# Patient Record
Sex: Male | Born: 1990
Health system: Southern US, Community
[De-identification: ages and names within clinical notes are randomized; demographics above are authoritative.]

## PROBLEM LIST (undated history)

## (undated) DIAGNOSIS — F329 Major depressive disorder, single episode, unspecified: Secondary | ICD-10-CM

## (undated) DIAGNOSIS — F1910 Other psychoactive substance abuse, uncomplicated: Secondary | ICD-10-CM

## (undated) DIAGNOSIS — U071 COVID-19: Secondary | ICD-10-CM

## (undated) DIAGNOSIS — J45909 Unspecified asthma, uncomplicated: Secondary | ICD-10-CM

## (undated) DIAGNOSIS — F32A Depression, unspecified: Secondary | ICD-10-CM

## (undated) DIAGNOSIS — Z8489 Family history of other specified conditions: Secondary | ICD-10-CM

## (undated) DIAGNOSIS — T7840XA Allergy, unspecified, initial encounter: Secondary | ICD-10-CM

## (undated) DIAGNOSIS — K859 Acute pancreatitis without necrosis or infection, unspecified: Secondary | ICD-10-CM

## (undated) DIAGNOSIS — K219 Gastro-esophageal reflux disease without esophagitis: Secondary | ICD-10-CM

## (undated) DIAGNOSIS — F419 Anxiety disorder, unspecified: Secondary | ICD-10-CM

## (undated) DIAGNOSIS — R079 Chest pain, unspecified: Secondary | ICD-10-CM

## (undated) DIAGNOSIS — K602 Anal fissure, unspecified: Secondary | ICD-10-CM

## (undated) DIAGNOSIS — D721 Eosinophilia, unspecified: Secondary | ICD-10-CM

## (undated) HISTORY — DX: Allergy, unspecified, initial encounter: T78.40XA

## (undated) HISTORY — DX: Acute pancreatitis without necrosis or infection, unspecified: K85.90

## (undated) HISTORY — PX: APPENDECTOMY: SHX54

## (undated) HISTORY — DX: COVID-19: U07.1

## (undated) HISTORY — DX: Depression, unspecified: F32.A

## (undated) HISTORY — DX: Unspecified asthma, uncomplicated: J45.909

## (undated) HISTORY — DX: Gastro-esophageal reflux disease without esophagitis: K21.9

## (undated) HISTORY — DX: Anal fissure, unspecified: K60.2

## (undated) HISTORY — DX: Major depressive disorder, single episode, unspecified: F32.9

## (undated) HISTORY — PX: TONSILLECTOMY AND ADENOIDECTOMY: SUR1326

## (undated) HISTORY — DX: Anxiety disorder, unspecified: F41.9

## (undated) HISTORY — DX: Eosinophilia, unspecified: D72.10

## (undated) HISTORY — DX: Other psychoactive substance abuse, uncomplicated: F19.10

---
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
## 2010-01-08 ENCOUNTER — Emergency Department (HOSPITAL_COMMUNITY)
Admission: EM | Admit: 2010-01-08 | Discharge: 2010-01-08 | Payer: Self-pay | Source: Home / Self Care | Admitting: Emergency Medicine

## 2010-05-03 LAB — POCT I-STAT, CHEM 8
BUN: 9 mg/dL (ref 6–23)
Calcium, Ion: 1.13 mmol/L (ref 1.12–1.32)
Chloride: 104 mEq/L (ref 96–112)

## 2010-05-03 LAB — GLUCOSE, CAPILLARY

## 2011-01-15 ENCOUNTER — Emergency Department: Payer: Self-pay | Admitting: Emergency Medicine

## 2011-06-07 ENCOUNTER — Ambulatory Visit: Payer: Self-pay | Admitting: Sports Medicine

## 2011-11-01 ENCOUNTER — Emergency Department: Payer: Self-pay | Admitting: Emergency Medicine

## 2011-11-01 LAB — CBC WITH DIFFERENTIAL/PLATELET
Eosinophil %: 7.5 %
HGB: 14.5 g/dL (ref 13.0–18.0)
Lymphocyte %: 23.5 %
MCHC: 35 g/dL (ref 32.0–36.0)
Neutrophil %: 53.5 %
Platelet: 177 10*3/uL (ref 150–440)
RBC: 4.55 10*6/uL (ref 4.40–5.90)

## 2011-11-01 LAB — COMPREHENSIVE METABOLIC PANEL
Anion Gap: 9 (ref 7–16)
Calcium, Total: 9 mg/dL (ref 8.5–10.1)
Co2: 24 mmol/L (ref 21–32)
EGFR (African American): >60
EGFR (Non-African Amer.): >60
Osmolality: 277 (ref 275–301)
Potassium: 3.6 mmol/L (ref 3.5–5.1)
SGOT(AST): 19 U/L (ref 15–37)
Sodium: 140 mmol/L (ref 136–145)

## 2011-11-06 LAB — CULTURE, BLOOD (SINGLE)

## 2011-12-15 ENCOUNTER — Emergency Department: Payer: Self-pay | Admitting: Internal Medicine

## 2012-01-19 ENCOUNTER — Emergency Department: Payer: Self-pay | Admitting: Emergency Medicine

## 2012-01-19 LAB — CBC WITH DIFFERENTIAL/PLATELET
Basophil #: 0.1 10*3/uL (ref 0.0–0.1)
Eosinophil #: 0.4 10*3/uL (ref 0.0–0.7)
Eosinophil %: 5.2 %
MCH: 31.1 pg (ref 26.0–34.0)
Monocyte #: 0.5 x10 3/mm (ref 0.2–1.0)
Neutrophil %: 64.5 %
Platelet: 191 10*3/uL (ref 150–440)
RBC: 4.8 10*6/uL (ref 4.40–5.90)
RDW: 14.1 % (ref 11.5–14.5)

## 2012-01-19 LAB — COMPREHENSIVE METABOLIC PANEL
Albumin: 4.3 g/dL (ref 3.4–5.0)
Anion Gap: 5 — ABNORMAL LOW (ref 7–16)
Creatinine: 0.77 mg/dL (ref 0.60–1.30)
Glucose: 105 mg/dL — ABNORMAL HIGH (ref 65–99)
Osmolality: 278 (ref 275–301)
Potassium: 4.3 mmol/L (ref 3.5–5.1)
Sodium: 138 mmol/L (ref 136–145)
Total Protein: 7.3 g/dL (ref 6.4–8.2)

## 2012-01-19 LAB — PROTIME-INR: INR: 0.9

## 2012-01-19 LAB — URINALYSIS, COMPLETE
Bacteria: NONE SEEN
Bilirubin,UR: NEGATIVE
Glucose,UR: NEGATIVE mg/dL (ref 0–75)
Leukocyte Esterase: NEGATIVE
Nitrite: NEGATIVE
Specific Gravity: 1.009 (ref 1.003–1.030)
WBC UR: NONE SEEN /HPF (ref 0–5)

## 2012-04-30 ENCOUNTER — Emergency Department: Payer: Self-pay | Admitting: Emergency Medicine

## 2012-04-30 LAB — COMPREHENSIVE METABOLIC PANEL
BUN: 13 mg/dL (ref 7–18)
Chloride: 106 mmol/L (ref 98–107)
Co2: 22 mmol/L (ref 21–32)
Glucose: 94 mg/dL (ref 65–99)
Osmolality: 272 (ref 275–301)
Potassium: 3.8 mmol/L (ref 3.5–5.1)
SGPT (ALT): 32 U/L (ref 12–78)

## 2012-04-30 LAB — URINALYSIS, COMPLETE
Bacteria: NONE SEEN
Glucose,UR: NEGATIVE mg/dL (ref 0–75)
Ketone: NEGATIVE
Nitrite: NEGATIVE
Ph: 7 (ref 4.5–8.0)
Protein: NEGATIVE
RBC,UR: <1 /HPF (ref 0–5)
Specific Gravity: 1.01 (ref 1.003–1.030)
WBC UR: NONE SEEN /HPF (ref 0–5)

## 2012-04-30 LAB — CBC
MCH: 31.2 pg (ref 26.0–34.0)
MCHC: 34.2 g/dL (ref 32.0–36.0)
Platelet: 222 10*3/uL (ref 150–440)
RBC: 5.09 10*6/uL (ref 4.40–5.90)

## 2012-05-29 ENCOUNTER — Ambulatory Visit: Payer: Self-pay | Admitting: Gastroenterology

## 2012-06-03 DIAGNOSIS — F29 Unspecified psychosis not due to a substance or known physiological condition: Secondary | ICD-10-CM | POA: Insufficient documentation

## 2012-07-12 ENCOUNTER — Emergency Department: Payer: Self-pay | Admitting: Emergency Medicine

## 2012-07-12 LAB — ETHANOL
Ethanol %: <0.003 % (ref 0.000–0.080)
Ethanol: <3 mg/dL

## 2012-07-12 LAB — COMPREHENSIVE METABOLIC PANEL
Calcium, Total: 9.2 mg/dL (ref 8.5–10.1)
Co2: 24 mmol/L (ref 21–32)
EGFR (African American): >60
Glucose: 146 mg/dL — ABNORMAL HIGH (ref 65–99)
Potassium: 3.2 mmol/L — ABNORMAL LOW (ref 3.5–5.1)

## 2012-07-12 LAB — CBC
HCT: 44.9 % (ref 40.0–52.0)
HGB: 15.8 g/dL (ref 13.0–18.0)
MCHC: 35.2 g/dL (ref 32.0–36.0)
MCV: 90 fL (ref 80–100)
RBC: 5 10*6/uL (ref 4.40–5.90)
RDW: 13.4 % (ref 11.5–14.5)

## 2012-08-30 ENCOUNTER — Emergency Department: Payer: Self-pay | Admitting: Emergency Medicine

## 2013-01-10 LAB — COMPREHENSIVE METABOLIC PANEL
Alkaline Phosphatase: 100 U/L (ref 50–136)
Anion Gap: 8 (ref 7–16)
BUN: 7 mg/dL (ref 7–18)
Creatinine: 0.93 mg/dL (ref 0.60–1.30)
EGFR (African American): >60
EGFR (Non-African Amer.): >60
Osmolality: 274 (ref 275–301)
Potassium: 3.6 mmol/L (ref 3.5–5.1)
SGOT(AST): 21 U/L (ref 15–37)
SGPT (ALT): 26 U/L (ref 12–78)
Sodium: 138 mmol/L (ref 136–145)
Total Protein: 7.7 g/dL (ref 6.4–8.2)

## 2013-01-10 LAB — CBC
HCT: 43.4 % (ref 40.0–52.0)
HGB: 14.7 g/dL (ref 13.0–18.0)
MCH: 30.8 pg (ref 26.0–34.0)
MCHC: 33.8 g/dL (ref 32.0–36.0)
MCV: 91 fL (ref 80–100)
RBC: 4.76 10*6/uL (ref 4.40–5.90)

## 2013-01-10 LAB — ETHANOL
Ethanol %: <0.003 % (ref 0.000–0.080)
Ethanol: <3 mg/dL

## 2013-01-12 ENCOUNTER — Inpatient Hospital Stay: Payer: Self-pay | Admitting: Psychiatry

## 2013-01-13 LAB — BEHAVIORAL MEDICINE 1 PANEL
Albumin: 4.3 g/dL (ref 3.4–5.0)
Alkaline Phosphatase: 87 U/L
Basophil #: 0.1 10*3/uL (ref 0.0–0.1)
Chloride: 106 mmol/L (ref 98–107)
EGFR (African American): >60
Eosinophil #: 1.1 10*3/uL — ABNORMAL HIGH (ref 0.0–0.7)
Eosinophil %: 12.5 %
Lymphocyte %: 32 %
Monocyte #: 0.8 x10 3/mm (ref 0.2–1.0)
Monocyte %: 8.6 %
Neutrophil #: 4.2 10*3/uL (ref 1.4–6.5)
Osmolality: 273 (ref 275–301)
Potassium: 3.8 mmol/L (ref 3.5–5.1)
RBC: 5.07 10*6/uL (ref 4.40–5.90)
RDW: 13.3 % (ref 11.5–14.5)
Sodium: 137 mmol/L (ref 136–145)
Thyroid Stimulating Horm: 0.435 u[IU]/mL — ABNORMAL LOW

## 2013-01-13 LAB — DRUG SCREEN, URINE
Barbiturates, Ur Screen: NEGATIVE (ref ?–200)
Cannabinoid 50 Ng, Ur ~~LOC~~: POSITIVE (ref ?–50)
Cocaine Metabolite,Ur ~~LOC~~: NEGATIVE (ref ?–300)
MDMA (Ecstasy)Ur Screen: NEGATIVE (ref ?–500)
Methadone, Ur Screen: NEGATIVE (ref ?–300)
Phencyclidine (PCP) Ur S: NEGATIVE (ref ?–25)
Tricyclic, Ur Screen: POSITIVE (ref ?–1000)

## 2013-01-13 LAB — URINALYSIS, COMPLETE
Bacteria: NONE SEEN
Bilirubin,UR: NEGATIVE
Ketone: NEGATIVE
Ph: 5 (ref 4.5–8.0)
Protein: NEGATIVE
RBC,UR: 1 /HPF (ref 0–5)
Squamous Epithelial: NONE SEEN
WBC UR: 1 /HPF (ref 0–5)

## 2013-03-27 ENCOUNTER — Emergency Department: Payer: Self-pay | Admitting: Emergency Medicine

## 2013-03-27 LAB — COMPREHENSIVE METABOLIC PANEL
ALBUMIN: 4.3 g/dL (ref 3.4–5.0)
ALK PHOS: 86 U/L
ALT: 48 U/L (ref 12–78)
AST: 28 U/L (ref 15–37)
Anion Gap: 8 (ref 7–16)
BUN: 10 mg/dL (ref 7–18)
Bilirubin,Total: 0.2 mg/dL (ref 0.2–1.0)
CHLORIDE: 105 mmol/L (ref 98–107)
CREATININE: 0.8 mg/dL (ref 0.60–1.30)
Calcium, Total: 8.9 mg/dL (ref 8.5–10.1)
Co2: 26 mmol/L (ref 21–32)
EGFR (Non-African Amer.): >60
GLUCOSE: 88 mg/dL (ref 65–99)
Osmolality: 276 (ref 275–301)
POTASSIUM: 3.5 mmol/L (ref 3.5–5.1)
SODIUM: 139 mmol/L (ref 136–145)
Total Protein: 7.5 g/dL (ref 6.4–8.2)

## 2013-03-27 LAB — CBC
HCT: 46.7 % (ref 40.0–52.0)
HGB: 15.7 g/dL (ref 13.0–18.0)
MCH: 31.3 pg (ref 26.0–34.0)
MCHC: 33.7 g/dL (ref 32.0–36.0)
MCV: 93 fL (ref 80–100)
PLATELETS: 188 10*3/uL (ref 150–440)
RBC: 5.03 10*6/uL (ref 4.40–5.90)
RDW: 13.3 % (ref 11.5–14.5)
WBC: 11 10*3/uL — ABNORMAL HIGH (ref 3.8–10.6)

## 2013-03-27 LAB — ETHANOL
ETHANOL %: 0.068 % (ref 0.000–0.080)
ETHANOL LVL: 68 mg/dL

## 2013-03-27 LAB — SALICYLATE LEVEL: Salicylates, Serum: 3.2 mg/dL — ABNORMAL HIGH

## 2013-03-27 LAB — ACETAMINOPHEN LEVEL

## 2013-07-03 ENCOUNTER — Emergency Department: Payer: Self-pay | Admitting: Emergency Medicine

## 2013-08-13 ENCOUNTER — Emergency Department: Payer: Self-pay | Admitting: Emergency Medicine

## 2013-08-13 LAB — URINALYSIS, COMPLETE
BILIRUBIN, UR: NEGATIVE
Bacteria: NONE SEEN
Blood: NEGATIVE
Glucose,UR: NEGATIVE mg/dL (ref 0–75)
KETONE: NEGATIVE
Leukocyte Esterase: NEGATIVE
NITRITE: NEGATIVE
PH: 8 (ref 4.5–8.0)
Protein: NEGATIVE
RBC, UR: NONE SEEN /HPF (ref 0–5)
SQUAMOUS EPITHELIAL: NONE SEEN
Specific Gravity: 1.003 (ref 1.003–1.030)

## 2013-08-13 LAB — COMPREHENSIVE METABOLIC PANEL
ALBUMIN: 3.9 g/dL (ref 3.4–5.0)
ALK PHOS: 90 U/L
ALK PHOS: 97 U/L
ALT: 33 U/L (ref 12–78)
ANION GAP: 8 (ref 7–16)
AST: 25 U/L (ref 15–37)
AST: 28 U/L (ref 15–37)
Albumin: 4.2 g/dL (ref 3.4–5.0)
Anion Gap: 8 (ref 7–16)
BILIRUBIN TOTAL: 0.2 mg/dL (ref 0.2–1.0)
BILIRUBIN TOTAL: 0.4 mg/dL (ref 0.2–1.0)
BUN: 9 mg/dL (ref 7–18)
BUN: 9 mg/dL (ref 7–18)
CALCIUM: 9.2 mg/dL (ref 8.5–10.1)
CO2: 25 mmol/L (ref 21–32)
CO2: 28 mmol/L (ref 21–32)
CREATININE: 1.3 mg/dL (ref 0.60–1.30)
Calcium, Total: 9.5 mg/dL (ref 8.5–10.1)
Chloride: 105 mmol/L (ref 98–107)
Chloride: 105 mmol/L (ref 98–107)
Creatinine: 0.99 mg/dL (ref 0.60–1.30)
EGFR (Non-African Amer.): >60
GLUCOSE: 95 mg/dL (ref 65–99)
Glucose: 92 mg/dL (ref 65–99)
OSMOLALITY: 280 (ref 275–301)
Osmolality: 274 (ref 275–301)
POTASSIUM: 4 mmol/L (ref 3.5–5.1)
POTASSIUM: 4 mmol/L (ref 3.5–5.1)
SGPT (ALT): 33 U/L (ref 12–78)
SODIUM: 141 mmol/L (ref 136–145)
Sodium: 138 mmol/L (ref 136–145)
Total Protein: 7.4 g/dL (ref 6.4–8.2)
Total Protein: 7.9 g/dL (ref 6.4–8.2)

## 2013-08-13 LAB — CBC
HCT: 44.3 % (ref 40.0–52.0)
HCT: 46.3 % (ref 40.0–52.0)
HGB: 15 g/dL (ref 13.0–18.0)
HGB: 15.4 g/dL (ref 13.0–18.0)
MCH: 31.1 pg (ref 26.0–34.0)
MCH: 31.2 pg (ref 26.0–34.0)
MCHC: 33.9 g/dL (ref 32.0–36.0)
MCHC: 33.3 g/dL (ref 32.0–36.0)
MCV: 92 fL (ref 80–100)
MCV: 94 fL (ref 80–100)
PLATELETS: 180 10*3/uL (ref 150–440)
Platelet: 184 10*3/uL (ref 150–440)
RBC: 4.82 10*6/uL (ref 4.40–5.90)
RBC: 4.94 10*6/uL (ref 4.40–5.90)
RDW: 13.4 % (ref 11.5–14.5)
RDW: 13.6 % (ref 11.5–14.5)
WBC: 11.5 10*3/uL — ABNORMAL HIGH (ref 3.8–10.6)
WBC: 14.4 10*3/uL — AB (ref 3.8–10.6)

## 2013-08-13 LAB — ACETAMINOPHEN LEVEL: Acetaminophen: <2 ug/mL

## 2013-08-13 LAB — SALICYLATE LEVEL: Salicylates, Serum: 4.1 mg/dL — ABNORMAL HIGH

## 2013-08-13 LAB — ETHANOL: Ethanol %: <0.003 % (ref 0.000–0.080)

## 2013-08-14 ENCOUNTER — Inpatient Hospital Stay: Payer: Self-pay | Admitting: Psychiatry

## 2013-08-14 LAB — DRUG SCREEN, URINE
AMPHETAMINES, UR SCREEN: NEGATIVE (ref ?–1000)
BARBITURATES, UR SCREEN: NEGATIVE (ref ?–200)
BENZODIAZEPINE, UR SCRN: NEGATIVE (ref ?–200)
COCAINE METABOLITE, UR ~~LOC~~: NEGATIVE (ref ?–300)
Cannabinoid 50 Ng, Ur ~~LOC~~: POSITIVE (ref ?–50)
MDMA (Ecstasy)Ur Screen: NEGATIVE (ref ?–500)
Methadone, Ur Screen: NEGATIVE (ref ?–300)
OPIATE, UR SCREEN: NEGATIVE (ref ?–300)
PHENCYCLIDINE (PCP) UR S: NEGATIVE (ref ?–25)
Tricyclic, Ur Screen: NEGATIVE (ref ?–1000)

## 2013-08-14 LAB — URINALYSIS, COMPLETE
BACTERIA: NONE SEEN
BILIRUBIN, UR: NEGATIVE
Blood: NEGATIVE
Glucose,UR: NEGATIVE mg/dL (ref 0–75)
KETONE: NEGATIVE
LEUKOCYTE ESTERASE: NEGATIVE
NITRITE: NEGATIVE
PROTEIN: NEGATIVE
Ph: 7 (ref 4.5–8.0)
RBC,UR: <1 /HPF (ref 0–5)
Specific Gravity: 1.015 (ref 1.003–1.030)
Squamous Epithelial: NONE SEEN

## 2013-08-15 LAB — CBC WITH DIFFERENTIAL/PLATELET
BANDS NEUTROPHIL: 1 %
BASOS ABS: 1 %
COMMENT - H1-COM3: NORMAL
Comment - H1-Com2: NORMAL
EOS PCT: 6 %
HCT: 46.8 % (ref 40.0–52.0)
HGB: 16 g/dL (ref 13.0–18.0)
LYMPHS PCT: 18 %
MCH: 31.5 pg (ref 26.0–34.0)
MCHC: 34.1 g/dL (ref 32.0–36.0)
MCV: 92 fL (ref 80–100)
Monocytes: 6 %
PLATELETS: 200 10*3/uL (ref 150–440)
RBC: 5.07 10*6/uL (ref 4.40–5.90)
RDW: 13.5 % (ref 11.5–14.5)
Segmented Neutrophils: 55 %
VARIANT LYMPHOCYTE - H1-RLYMPH: 13 %
WBC: 10 10*3/uL (ref 3.8–10.6)

## 2013-08-15 LAB — URINALYSIS, COMPLETE
BILIRUBIN, UR: NEGATIVE
Bacteria: NONE SEEN
Blood: NEGATIVE
Glucose,UR: NEGATIVE mg/dL (ref 0–75)
Ketone: NEGATIVE
Leukocyte Esterase: NEGATIVE
NITRITE: NEGATIVE
PH: 6 (ref 4.5–8.0)
Protein: NEGATIVE
RBC,UR: 1 /HPF (ref 0–5)
SQUAMOUS EPITHELIAL: NONE SEEN
Specific Gravity: 1.012 (ref 1.003–1.030)
WBC UR: 1 /HPF (ref 0–5)

## 2013-08-15 LAB — SEDIMENTATION RATE: Erythrocyte Sed Rate: 6 mm/hr (ref 0–15)

## 2013-08-15 LAB — LACTATE DEHYDROGENASE: LDH: 147 U/L (ref 85–241)

## 2013-08-15 LAB — GC/CHLAMYDIA PROBE AMP

## 2013-08-16 LAB — CBC WITH DIFFERENTIAL/PLATELET
BASOS PCT: 0.9 %
Basophil #: 0.1 10*3/uL (ref 0.0–0.1)
Basophil: 1 %
COMMENT - H1-COM2: NORMAL
Eosinophil #: 0.5 10*3/uL (ref 0.0–0.7)
Eosinophil %: 5.5 %
Eosinophil: 7 %
HCT: 46.3 % (ref 40.0–52.0)
HGB: 16 g/dL (ref 13.0–18.0)
LYMPHS ABS: 2.2 10*3/uL (ref 1.0–3.6)
LYMPHS PCT: 23.9 %
Lymphocytes: 47 %
MCH: 31.7 pg (ref 26.0–34.0)
MCHC: 34.5 g/dL (ref 32.0–36.0)
MCV: 92 fL (ref 80–100)
MONOS PCT: 7 %
Monocyte #: 0.8 x10 3/mm (ref 0.2–1.0)
Monocyte %: 9.1 %
NEUTROS ABS: 5.7 10*3/uL (ref 1.4–6.5)
NEUTROS PCT: 60.6 %
Platelet: 205 10*3/uL (ref 150–440)
RBC: 5.03 10*6/uL (ref 4.40–5.90)
RDW: 13.1 % (ref 11.5–14.5)
Segmented Neutrophils: 38 %
WBC: 9.4 10*3/uL (ref 3.8–10.6)

## 2013-08-16 LAB — RAPID HIV-1/2 QL/CONFIRM: HIV-1/2, RAPID QL: NEGATIVE

## 2013-08-18 LAB — LITHIUM LEVEL: Lithium: 0.57 mmol/L — ABNORMAL LOW

## 2014-06-12 NOTE — Consult Note (Signed)
PATIENT NAME:  [name], [name] MR#:  [id number] DATE OF BIRTH:  Jan 17, 1991  DATE OF CONSULTATION:  01/10/2013  CONSULTING PHYSICIAN:  Gonzella Lex, MD  IDENTIFYING INFORMATION AND REASON FOR CONSULT: This is a 24 year old man with history of schizophrenia brought to the Emergency Room. Consultation for psychiatric disposition.   HISTORY OF PRESENT ILLNESS: Information obtained from the chart and the patient. The patient was brought to [facility] today for new intake evaluation. Dr. Clovis Riley evaluated him and found him to be psychotic, agitated, and admitting to recent substance abuse. He was endorsing auditory hallucinations that told him to kill and eat people. The family was feeling increasingly frightened for him and his safety. The patient himself is not a very good historian. He does admit to me that he has auditory hallucinations and that yes, they do tell him to kill and eat people. He denies that he actually thinks he does plan to kill or eat people. He will not answer my direct questions about drugs. When he does, he denies that he has used any kind of recreational drugs recently. He says that he has been compliant with his antipsychotics. He expresses anger towards his family and says he does not see any reason to be here in the hospital. He endorses angry mood, agitation, feeling ill at ease. He does not endorse any physical symptoms. Denies any pain, sickness, weight loss GI or pulmonary symptoms.   PAST PSYCHIATRIC HISTORY: The patient carries a diagnosis of schizophrenia or schizoaffective disorder. He has been treated with medications. He does not know many details about the past ones. Most recently it appears to have been mostly Seroquel just 100 mg at night. The patient has a history of substance abuse. He will not answer any questions to me right now about suicide history.   FAMILY HISTORY: Not known because he is adopted.   PAST MEDICAL HISTORY: Not known to have any significant  ongoing medical problems.   SUBSTANCE ABUSE HISTORY: Intermittently abuses a variety of substances including alcohol, dextromethorphan, narcotics, possibly cocaine and marijuana. We have documentation that he received some hydromorphone or hydrocodone in the last day or so. Mother thinks that he has taken all of it. As of right now, they have not yet been able to get a urine sample from him.   SOCIAL HISTORY: The patient is living by himself apparently on some property owned or rented by his family. He does not describe any particular activity that he engages in during the day. Closest relationships appear to be with his family, but they are mixed in that he is hostile and aggressive to them at times.   REVIEW OF SYSTEMS: Endorses anger. Endorses some sleep problems. Endorses hallucinations. Denies suicidal or homicidal ideation, although after he gets himself really angry,  he starts making threats to eat people in the Emergency Room. He denies any symptoms of pain, nausea, vomiting, cardiac symptoms, pulmonary symptoms, et Ronney Asters.   CURRENT MEDICATIONS: As far as I can tell, just Seroquel 100 mg at night.   ALLERGIES: No known drug allergies.   MENTAL STATUS EXAMINATION: Reasonably well-groomed young man, looks his stated age. Barely cooperative. Eye contact intermittent. Psychomotor activity at first is restrained, but once I told him he was not leaving the hospital tonight, he became so angry he attempted to run out of the Emergency Room and had to be put in 4-point restraints to be given medication and continue to thrash and fight. Speech is loud and shouted.  Thoughts are disorganized and hostile and angry. Admits to auditory hallucinations. Makes some hospital threatening statements. Denies suicidal ideation. Loud speech, rapid speech. Probably baseline normal intelligence and alert and oriented.   LABORATORY RESULTS: Chemistry panel is all normal. Alcohol level negative. CBC shows an elevated  white count at 14.8. Salicylates and acetaminophen are not toxic. He has not given [place] a urine sample yet.   ASSESSMENT: A 24 year old man with a history of schizophrenia who became extremely agitated, violent, and disruptive in the Emergency Room, requiring physical restraint and multiple doses of forced medicines. He needs hospital level treatment but at this point, his behavior would make him unsafe to manage on the ward. I recommend that he stay in the Emergency Room. We have given him liberal doses of antipsychotics and p.r.n. medicines. He will be re-evaluated over the weekend.   DIAGNOSIS, PRINCIPAL AND PRIMARY:  AXIS I: Schizophrenia.   SECONDARY DIAGNOSES: AXIS I: Polysubstance dependence.  AXIS II: Deferred.  AXIS III: No diagnosis.  AXIS IV: Severe from acute agitation and illness.  AXIS V: Functioning at time of evaluation 25.   ____________________________ Gonzella Lex, MD jtc:jcm D: 01/10/2013 18:23:42 ET T: 01/10/2013 19:02:34 ET JOB#: [id number]  cc: Gonzella Lex, MD, <Dictator> Gonzella Lex MD ELECTRONICALLY SIGNED 01/11/2013 10:58

## 2014-06-12 NOTE — Consult Note (Signed)
Brief Consult Note: Diagnosis: schizophrenia, polysubstance abuse.   Patient was seen by consultant.   Consult note dictated.   Recommend further assessment or treatment.   Orders entered.   Discussed with Attending MD.   Comments: Psychiatry: Patient seen and chart reviewewd. Patient is agitated and too agressive to allow for safe management on the floor. Orders written to increase doses of antipsychotics and liberal prns. Will reevaluate tomorrow.  Electronic Signatures: Gonzella Lex (MD)  (Signed 21-Nov-14 17:41)  Authored: Brief Consult Note   Last Updated: 21-Nov-14 17:41 by Gonzella Lex (MD)

## 2014-06-12 NOTE — H&P (Signed)
PATIENT NAME:  [name], [name] MR#:  [id number] DATE OF BIRTH:  Dec 29, 1990  DATE OF ADMISSION:  01/12/2013  DATE OF CONSULTATION: 01/12/2013.   IDENTIFYING INFORMATION AND CHIEF COMPLAINT: This is a 24 year old man, brought into the hospital under involuntary commitment from [facility].   CHIEF COMPLAINT: "I don't want to be here."   HISTORY OF PRESENT ILLNESS: Information obtained from the patient, from the chart and from the patient's mother. This patient has had a long-standing history of behavior problems, mood lability and agitation. He had been previously seeing a Designer, jewellery in [place]. He had recently been referred to see someone here in the community. He saw Dr. Clovis Riley this week, who found the patient to be dangerously psychotic and agitated. When he came into the hospital, the patient was stating that he had auditory hallucinations telling him to kill and eat people. His mood was angry. His behavior was agitated. It was unclear whether he had been getting or taking recent medicines, but all he had been prescribed was 100 mg of Seroquel and 100 mg of Lamictal. Since the first day in the Emergency Room, he has been taking medicine here in the ER, and has not had any return of a need for restraint.   PAST PSYCHIATRIC HISTORY: Long-standing history of behavior problems going back to childhood. Has spent time in [employer]. Has been diagnosed variously as attention deficit/hyperactivity disorder, bipolar and schizoaffective. Has a history of lot of aggressive acting-out behavior. It is not clear if he has ever made a serious attempt to kill himself, but has made some gestures. No hospitalizations previously, at least at our facility.   PAST MEDICAL HISTORY: The patient has asthma. No other ongoing medical problems.   SOCIAL HISTORY: Not married, not working. Has not yet gotten disability. Parents try and look out for him. He was recently living at a home owned by his parents.    SUBSTANCE ABUSE HISTORY: History of abuse of multiple substances including alcohol, dextromethorphan, narcotics and probably cocaine and marijuana. Probably been using narcotics for the last couple of days before coming into the hospital, possibly also Xanax.   REVIEW OF SYSTEMS: Currently he denies suicidal or homicidal ideation. Voices are diminished. Mood is still feeling anxious and agitated. Denies any suicidal or homicidal intent.   CURRENT MEDICATIONS: Since being here in the Emergency Room we have been giving him Seroquel 300 mg at night, Advair Diskus 1 puff twice a day, Ativan p.r.n., Seroquel 50 mg q.6 p.r.n., nicotine patch, albuterol p.r.n.   ALLERGIES: NO KNOWN DRUG ALLERGIES.   MENTAL STATUS EXAMINATION: Neatly groomed young man, looks his stated age. Generally, cooperative with the interview. Eye contact good. Psychomotor activity a little agitated. Speech is normal in rate, tone and volume. Affect a little bit animated and jittery. Mood stated as being still anxious and a little angry. Thoughts are a little bit disorganized but not grossly bizarre. Denies acute suicidal or homicidal ideation. Judgment and insight chronically impaired. Intelligence normal. Alert and oriented x4.   PHYSICAL EXAMINATION: SKIN: No acute skin lesions.  HEENT: Pupils equal and reactive. Face symmetric.  NECK AND BACK: Nontender.  MUSCULOSKELETAL: Full range of motion at all extremities.  NEUROLOGIC:  Cranial nerves symmetric and normal. Reflexes and strength symmetric and normal throughout.  LUNGS: Clear, no wheezes.  HEART: Regular rate and rhythm.  ABDOMEN: Soft, nontender, normal bowel sounds.  VITAL SIGNS: Temperature 98.2, pulse 89, respirations 18, blood pressure 131/82.   LABORATORY RESULTS: When he  came in, he had a white count elevated at 14.8, otherwise normal CBC. Chemistry panel normal. Alcohol negative. Acetaminophen and salicylates showed acetaminophen not detected, salicylates  slightly elevated. Still never gave urinalysis or drug screen, despite it being ordered several days ago.   ASSESSMENT: A 24 year old man with chronic mental health problems, behavior problems, presented with psychotic symptoms, mood lability and agitation. Has calmed down a bit since being in the Emergency Room. Family is still not comfortable with him coming home.   PLAN: To admit him to the inpatient unit for further management. Continue current medications. Try and get social work to see if he can be referred to appropriate outpatient care.   DIAGNOSIS, PRINCIPAL AND PRIMARY:  AXIS I: Schizoaffective disorder, bipolar type.   SECONDARY DIAGNOSES: AXIS I: Polysubstance dependence, in early remission.   AXIS II: Deferred.   AXIS III: Asthma.   AXIS IV: Severe from his chronic disability and lack of functioning.   AXIS V: Functioning at time of evaluation 30.     ____________________________ Gonzella Lex, MD jtc:cg D: 01/12/2013 17:32:33 ET T: 01/12/2013 22:31:56 ET JOB#: [id number]  cc: Gonzella Lex, MD, <Dictator> Gonzella Lex MD ELECTRONICALLY SIGNED 01/13/2013 9:21

## 2014-06-12 NOTE — Discharge Summary (Signed)
PATIENT NAME:  Michael [name], Michael [name] MR#:  [id number] DATE OF BIRTH:  01/19/1991  DATE OF ADMISSION:  01/12/2013 DATE OF DISCHARGE:  01/20/2013  HOSPITAL COURSE: See dictated history and physical for details of admission. A 24 year old man with a history of schizoaffective disorder and substance abuse and behavioral disruption, admitted to the hospital in an agitated mood, labile state. Initially required restraint because of aggressive behavior. Required some forced medication briefly. Gradually did become more cooperative with treatment. He did not attempt to harm himself and did not hurt anyone else while he was in the hospital. He was treated with a combination of medication and psychotherapy. The patient was treated with lithium and quetiapine primarily for psychiatric problems, which he tolerated well. At the time of discharge, the patient's mental state was greatly improved. He was not disorganized or paranoid. Denied suicidal or homicidal ideation. Reported he was no longer having intrusive bizarre ideas about hurting people or killing people. Denied hallucinations. We worked with his family upon educating them about the treatment plan and worked with the patient on hopefully coming up with a plan for community support treatment in the community. He was educated about the importance of staying away from abusable drugs with his mental state and expressed an understanding of that. At the time of discharge, he was not acutely dangerous, appeared to be tolerating treatment much better.   DISCHARGE MEDICATIONS: Lithium 900 mg [name].o. at bedtime, quetiapine 400 mg [name].o. at bedtime plus 100 mg twice a day, albuterol inhaler 2 puffs every 4 hours as needed for shortness of breath, Advair Diskus 250/50 mcg 1 puff twice a day.   LABORATORY RESULTS: Admission labs including urinalysis unremarkable. A set of rib films that showed no fracture or abnormality. Drug screen positive for opiates, cannabis, and benzodiazepines.  TSH slightly low at 0.443. AST elevated at 41. CBC unremarkable. The lithium level prior to discharge was 0.8.   MENTAL STATUS EXAM AT DISCHARGE: Neatly dressed and groomed young man who looks his stated age. Good eye contact. Normal psychomotor activity. Speech normal rate, tone and volume. Affect reactive, euthymic, appropriate. Mood stated as okay. Thoughts lucid. No loosening of associations or delusions. Denied auditory or visual hallucinations. Denied suicidal or homicidal ideation. Showed good insight and judgment. Normal intelligence. Alert and oriented x 4.   DISPOSITION: Discharge home. Follow up with [facility].   DIAGNOSIS, PRINCIPAL AND PRIMARY:  AXIS I: Schizoaffective disorder, bipolar type.   SECONDARY DIAGNOSES: AXIS I: Polysubstance dependence.  AXIS II: Antisocial and borderline features.  AXIS III: Chronic obstructive pulmonary disease, asthma.  AXIS IV: Severe, chronic illness.  AXIS V: Functioning at time of discharge 55.    ____________________________ Gonzella Lex, MD jtc:jcm D: 01/31/2013 15:57:43 ET T: 01/31/2013 16:21:24 ET JOB#: [id number]  cc: Gonzella Lex, MD, <Dictator> Gonzella Lex MD ELECTRONICALLY SIGNED 02/03/2013 11:23

## 2014-06-13 NOTE — Consult Note (Signed)
Brief Consult Note: Diagnosis: bilateral inguinal lymphnadenopathy.   Patient was seen by consultant.   Recommend further assessment or treatment.   Orders entered.   Comments: Hx of pain for ~ 1 week; no sores, ulcers, infections in legs, perineum or perianal region. Bilateral process by Hx, exam, and CT. Very tender inguinal nodes, but pt may have a very low pain threshold (opioid abuse Hx). No other lymphadenopathy, no redness. He does have a small area of dry slightly erythematous rash (fungal?) slightly to the left of his midline beltline crease. I have added Keflex (and D/C-e dthe doxycycline), as I believe it may be better tolerated. If D/C-ed, he should receive 14 days of Keflex and can F/U in my office in 2 - 3 weeks. I will have Dr Burt Knack re-examine him next week..  Electronic Signatures: Consuela Mimes (MD)  (Signed 27-Jun-15 16:02)  Authored: Brief Consult Note   Last Updated: 27-Jun-15 16:02 by Consuela Mimes (MD)

## 2014-06-13 NOTE — H&P (Signed)
PATIENT NAME:  [name], [name] MR#:  [id number] DATE OF BIRTH:  [birth year]  DATE OF ADMISSION:  08/14/2013  REFERRING PHYSICIAN: Emergency Room M.D.   ATTENDING PHYSICIAN: Jolanta B. Bary Leriche, M.D.   IDENTIFYING DATA: [name] is a 24 year old male with history of bipolar illness.   CHIEF COMPLAINT: "I was arguing with my mother."   HISTORY OF PRESENT ILLNESS:  The patient appears clueless to the reasons he was petitioned by his mother. He reports that they had been arguing yesterday, and that he was asked to be honest with her, so he disclosed to his mother the fact that he has continues auditory hallucinations of two voices with a [place] accent with a [place] accent. The voices telling him to kill himself and kill other people. Apparently, according to the chart, the patient texted  his mother a picture of himself with mouth full of medication and threatened suicide. The patient does have a history of suicide in 2013. He attempted by hanging and 2014, he overdosed on medications. The patient reports no problems whatsoever. He feels that he is doing splendidly and that his mother is exaggerating. He sees Dr. Timmothy Euler at [facility], who prescribes Geodon 20 mg once a day. The patient reports that when discharged from [facility] last time, he was taking lithium and Seroquel, but did not like sedation from Seroquel and did not like multiple doses of medication; however, his lithium was given a single dose last time. He denies symptoms of depression, anxiety, or psychosis, except for auditory hallucinations. He denies symptoms suggestive of bipolar mania. Reports normal level of activity. Good sleep. He denies alcohol or illicit drug abuse, but has been positive for cannabinoids.   PAST PSYCHIATRIC HISTORY: He has a long history of behavioral problems while growing up.  He has had multiple psychiatric diagnoses including oppositional defiant disorder, bipolar schizoaffective. He calls  himself a schizophrenic. There was a history of drug abuse, but lately he is only using marijuana consistently.  There were medication trials with Depakote, Seroquel, lithium, and Latuda. He seems to like Geodon the best, but I doubt that he is compliant at all. There were 2 suicide attempts as above.   FAMILY PSYCHIATRIC HISTORY: Mother with mental illness. Reportedly, she is doing fine.    PAST MEDICAL HISTORY: Obesity.  ALLERGIES: LATUDA.  MEDICATIONS ON ADMISSION: Geodon 20 mg daily.   SOCIAL HISTORY: He currently lives with his mother. His parents are divorced, but both are very supportive. He has Medicaid, but not has disability. He has never been married and has no children .  REVIEW OF SYSTEMS:  CONSTITUTIONAL: No fevers or chills. No weight changes.  EYES: No double or blurred vision.  EAR, NOSE, AND THROAT: No hearing loss.  RESPIRATORY: No shortness of breath or cough.  CARDIOVASCULAR: No chest pain or orthopnea.  GASTROINTESTINAL: Positive for is groin pain on the right side and no abdominal pain, nausea, vomiting, or diarrhea.  GENITOURINARY: No incontinence or frequency.  ENDOCRINE: No heat or cold intolerance.  LYMPHATIC: No anemia or easy bruising.  INTEGUMENTARY: No acne or rash.  MUSCULOSKELETAL: No muscle or joint pain.  NEUROLOGIC: No tingling or weakness.  PSYCHIATRIC: See history of present illness for details.   PHYSICAL EXAMINATION: VITAL SIGNS: Blood pressure 113/74, pulse 65, respirations 20, temperature 98.9. GENERAL:  This is a well-developed young male in no acute distress.  HEENT: The pupils are equal, round, and reactive to light. Sclerae are anicteric.  NECK: Supple. No thyromegaly.  LUNGS: Clear to auscultation. No dullness to percussion.  HEART: Regular rhythm and rate. No murmurs, rubs, or gallops.  ABDOMEN: Soft, nontender, nondistended. Positive bowel sounds.  MUSCULOSKELETAL: Normal muscle strength in all extremities.  SKIN: No rashes or  bruises.  LYMPHATIC: No cervical adenopathy.  NEUROLOGIC: Cranial nerves II-XII are intact.   LABORATORY DATA: Chemistries are within normal limits. Blood alcohol level is 0. LFTs within normal limits. Urine toxicology screen positive for cannabinoids. CBC within normal limits except for white blood count of 14.4. Urinalysis is not suggestive of urinary tract infection. Serum acetaminophen less than 2, salicylates 4.1.   EKG: normal sinus rhythm and normal EKG.  MENTAL STATUS EXAMINATION: On admission, the patient is alert and oriented to person, place, and time.  He gives me a completely different story from one obtained from the mother and it is recorded in the chart.  He is pleasant, polite, and cooperative, but giggling, hyper, goofy, certainly in mood too good for the situation with slightly pressured speech, loud at times. He is well groomed and casually dressed.  Thought process is illogical. He denies thoughts of hurting himself or others, but does report auditory command hallucinations telling him to do just that.  In the past, he did overdose and hurt people in response to the voices. Reportedly, he could have swallowed some medications last night and sent a photograph of it to his mother. He is grandiose and delusional. He is hallucinating. His cognition is grossly intact. Registration and recall are normal. Poor long and short-term memory. He is of average intelligence and fund of knowledge. His insight and judgment are poor.  SUICIDE RISK ASSESSMENT ON ADMISSION: This is a patient with a history of mood instability, depression, suicide attempt, substance abuse, and treatment noncompliance, who came to the hospital manic and psychotic in the context of treatment noncompliance.   INITIAL DIAGNOSES:  AXIS I: Bipolar disorder, mixed episode, with psychosis and cannabis abuse.  AXIS II: Deferred.  AXIS III: Deferred.  AXIS IV: Mental illness, substance abuse, family conflict, treatment  compliance.  AXIS V: Global assessment of functioning 25.   PLAN: The patient was in admitted to [facility] for safety, stabilization, and medication management. He was initially placed on suicide precautions and was closely monitored for any unsafe behaviors. He underwent full psychiatric and risk assessment. He received pharmacotherapy, individual and group psychotherapy, substance abuse counseling, and support from therapeutic milieu.   1.  Suicidal ideation: The patient denies.  2.  Mood and psychosis: He only wants to take Geodon 20 mg daily.  We, however, will increase gradually Geodon to 80 mg twice daily. We will restart lithium and offer, possibly, a sleeping pill if needed,   3. The patient complains of pain in the right groin. We will ask medicine for consultation.  4.  Substance abuse. He minimizes problems and declines treatment.   DISPOSITION: Most likely with his mother. The patient will follow up with [facility] and Dr. Timmothy Euler. He would benefit from long-lasting injectable antipsychotics if he agrees.   ____________________________ Wardell Honour Bary Leriche, MD jbp:ts D: 08/15/2013 14:45:31 ET T: 08/15/2013 15:42:23 ET JOB#: [id number]  cc: Jolanta B. Bary Leriche, MD, <Dictator> Clovis Fredrickson MD ELECTRONICALLY SIGNED 09/03/2013 4:48
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
## 2014-10-09 ENCOUNTER — Encounter: Payer: Self-pay | Admitting: Emergency Medicine

## 2014-10-09 ENCOUNTER — Emergency Department
Admission: EM | Admit: 2014-10-09 | Discharge: 2014-10-09 | Disposition: A | Discharge status: Home / Self Care | Payer: Medicaid Other | Attending: Emergency Medicine | Admitting: Emergency Medicine

## 2014-10-09 DIAGNOSIS — F1210 Cannabis abuse, uncomplicated: Secondary | ICD-10-CM | POA: Diagnosis present

## 2014-10-09 DIAGNOSIS — F1010 Alcohol abuse, uncomplicated: Secondary | ICD-10-CM | POA: Diagnosis not present

## 2014-10-09 DIAGNOSIS — Z79899 Other long term (current) drug therapy: Secondary | ICD-10-CM | POA: Insufficient documentation

## 2014-10-09 DIAGNOSIS — F1310 Sedative, hypnotic or anxiolytic abuse, uncomplicated: Secondary | ICD-10-CM | POA: Insufficient documentation

## 2014-10-09 DIAGNOSIS — F319 Bipolar disorder, unspecified: Secondary | ICD-10-CM

## 2014-10-09 DIAGNOSIS — F1994 Other psychoactive substance use, unspecified with psychoactive substance-induced mood disorder: Secondary | ICD-10-CM | POA: Diagnosis not present

## 2014-10-09 LAB — CBC
HCT: 46.9 % (ref 40.0–52.0)
Hemoglobin: 15.9 g/dL (ref 13.0–18.0)
MCH: 30.5 pg (ref 26.0–34.0)
MCHC: 33.9 g/dL (ref 32.0–36.0)
MCV: 89.8 fL (ref 80.0–100.0)
PLATELETS: 250 10*3/uL (ref 150–440)
RBC: 5.22 MIL/uL (ref 4.40–5.90)
RDW: 13.8 % (ref 11.5–14.5)
WBC: 15.6 10*3/uL — ABNORMAL HIGH (ref 3.8–10.6)

## 2014-10-09 LAB — COMPREHENSIVE METABOLIC PANEL
ALT: 27 U/L (ref 17–63)
ANION GAP: 12 (ref 5–15)
AST: 22 U/L (ref 15–41)
Albumin: 4.8 g/dL (ref 3.5–5.0)
Alkaline Phosphatase: 81 U/L (ref 38–126)
BUN: <5 mg/dL — ABNORMAL LOW (ref 6–20)
CHLORIDE: 107 mmol/L (ref 101–111)
CO2: 24 mmol/L (ref 22–32)
Calcium: 9.2 mg/dL (ref 8.9–10.3)
Creatinine, Ser: 0.83 mg/dL (ref 0.61–1.24)
Glucose, Bld: 103 mg/dL — ABNORMAL HIGH (ref 65–99)
POTASSIUM: 3.6 mmol/L (ref 3.5–5.1)
Sodium: 143 mmol/L (ref 135–145)
Total Bilirubin: 0.4 mg/dL (ref 0.3–1.2)
Total Protein: 8.1 g/dL (ref 6.5–8.1)

## 2014-10-09 LAB — URINE DRUG SCREEN, QUALITATIVE (ARMC ONLY)
Amphetamines, Ur Screen: POSITIVE — AB
BARBITURATES, UR SCREEN: NOT DETECTED
Benzodiazepine, Ur Scrn: POSITIVE — AB
CANNABINOID 50 NG, UR ~~LOC~~: POSITIVE — AB
COCAINE METABOLITE, UR ~~LOC~~: NOT DETECTED
MDMA (ECSTASY) UR SCREEN: NOT DETECTED
Methadone Scn, Ur: NOT DETECTED
Opiate, Ur Screen: NOT DETECTED
PHENCYCLIDINE (PCP) UR S: NOT DETECTED
Tricyclic, Ur Screen: NOT DETECTED

## 2014-10-09 LAB — ETHANOL: Alcohol, Ethyl (B): 193 mg/dL — ABNORMAL HIGH (ref <5)

## 2014-10-09 LAB — LIPASE, BLOOD: LIPASE: 12 U/L — AB (ref 22–51)

## 2014-10-09 MED ORDER — LORAZEPAM 2 MG PO TABS
0.0000 mg | ORAL_TABLET | Freq: Four times a day (QID) | ORAL | Status: DC
  Administered 2014-10-09: 2 mg via ORAL
  Filled 2014-10-09: qty 1

## 2014-10-09 MED ORDER — LORAZEPAM 2 MG/ML IJ SOLN: 0.0000 mg | Freq: Four times a day (QID) | INTRAMUSCULAR | Status: DC

## 2014-10-09 MED ORDER — LORAZEPAM 2 MG PO TABS
0.0000 mg | ORAL_TABLET | Freq: Two times a day (BID) | ORAL | Status: DC
  Administered 2014-10-09: 2 mg via ORAL

## 2014-10-09 MED ORDER — THIAMINE HCL 100 MG/ML IJ SOLN: 100.0000 mg | Freq: Every day | INTRAMUSCULAR | Status: DC

## 2014-10-09 MED ORDER — LORAZEPAM 2 MG/ML IJ SOLN: 0.0000 mg | Freq: Two times a day (BID) | INTRAMUSCULAR | Status: DC

## 2014-10-09 MED ORDER — VITAMIN B-1 100 MG PO TABS
100.0000 mg | ORAL_TABLET | Freq: Every day | ORAL | Status: DC
  Administered 2014-10-09: 100 mg via ORAL
  Filled 2014-10-09: qty 1

## 2014-10-09 MED ORDER — LORAZEPAM 2 MG PO TABS
ORAL_TABLET | ORAL | Status: AC
  Filled 2014-10-09: qty 1

## 2014-10-09 NOTE — BH Assessment (Signed)
Assessment Note  Michael [name] is an 24 y.o. male who reports to [facility] ER for substance abuse and SI.  Pt. Reports being suicidal on and off since he was 24 years of age and has current thoughts. H/o six attempts of suicide (hanging and self destructive behaviors with banging head on wall). His last attempt before 10/08/14 was two years ago.  His current trigger is an issue with finding his biological parents and current plan is to slowly poison himself with ETOH, herion, and cocaine.  Pt. Uses alcohol on a daily basis of approximately 1/2 gallon of bourbon a day, cocaine every other day, and herion "every once and a while." Previous inpatient at [facility], [place], and [place]. Pt. has nightmares of killing himself from jumping from a building. Pt. Goes to inpatient services with Clide Deutscher at [facility]. H/o diagnosis of depression and anxiety. Pt. Reports having severe anger issues. Pt. Has vague HI thoughts of "burn everyone to dust." He does not have a specific person named or plan. No A/V H.       Axis I: Anxiety Disorder NOS, Depressive Disorder NOS and Substance Abuse  Past Medical History: History reviewed. No pertinent past medical history.  History reviewed. No pertinent past surgical history.  Family History: History reviewed. No pertinent family history.  Social History:  reports that he has been smoking Cigarettes.  He has been smoking about 1.00 pack per day. He does not have any smokeless tobacco history on file. His alcohol and drug histories are not on file.  Additional Social History:  Alcohol / Drug Use Pain Medications: None Reported Prescriptions: None Reported Over the Counter: None Reported History of alcohol / drug use?: Yes Longest period of sobriety (when/how long): not since he was 14 Negative Consequences of Use: Financial, Personal relationships Withdrawal Symptoms: Agitation, Fever / Chills, Nausea / Vomiting, Tremors, Irritability,  Weakness Substance #1 Name of Substance 1: Alcohol  1 - Age of First Use: 12 1 - Amount (size/oz): 1/2 gallon of bourbon 1 - Frequency: everyday 1 - Duration: years 1 - Last Use / Amount: 10/08/14 Substance #2 Name of Substance 2: Cocaine 2 - Age of First Use: 17 2 - Amount (size/oz): gram 2 - Frequency: every other day 2 - Duration: 6 years 2 - Last Use / Amount: 10/08/14 Substance #3 Name of Substance 3: Herion 3 - Age of First Use: 19 3 - Amount (size/oz): 1/2 gram 3 - Frequency: "on & off" 3 - Duration: 4 years 3 - Last Use / Amount: 10/08/14  CIWA: CIWA-Ar BP: 125/79 mmHg Pulse Rate: 97 Nausea and Vomiting: mild nausea with no vomiting Tactile Disturbances: very mild itching, pins and needles, burning or numbness Tremor: two Auditory Disturbances: very mild harshness or ability to frighten Paroxysmal Sweats: barely perceptible sweating, palms moist Visual Disturbances: not present Anxiety: three Headache, Fullness in Head: moderately severe Agitation: somewhat more than normal activity Orientation and Clouding of Sensorium: oriented and can do serial additions CIWA-Ar Total: 14 COWS: Clinical Opiate Withdrawal Scale (COWS) Resting Pulse Rate: Pulse Rate 80 or below Sweating: Subjective report of chills or flushing Restlessness: Able to sit still Pupil Size: Pupils pinned or normal size for room light Bone or Joint Aches: Patient reports sever diffuse aching of joints/muscles Runny Nose or Tearing: Not present GI Upset: No GI symptoms Tremor: No tremor Yawning: No yawning Anxiety or Irritability: Patient reports increasing irritability or anxiousness Gooseflesh Skin: Skin is smooth COWS Total Score: 4  Allergies:  Allergies  Allergen Reactions  . Serotonin Reuptake Inhibitors (Ssris) Rash  . Gabapentin Nausea Only and Nausea And Vomiting  . Prednisone Other (See Comments)    Difficulty breathing  . Anette Guarneri [Lurasidone Hcl] Rash and Nausea And Vomiting     Home Medications:  (Not in a hospital admission)  OB/GYN Status:  No LMP for male patient.  General Assessment Data Location of Assessment: [facility] ED TTS Assessment: In system Is this a Tele or Face-to-Face Assessment?: Face-to-Face Is this an Initial Assessment or a Re-assessment for this encounter?: Initial Assessment Marital status: Single Maiden name: na Is patient pregnant?: No Pregnancy Status: No Living Arrangements: Other relatives (parents) Can pt return to current living arrangement?: Yes Is patient capable of signing voluntary admission?: Yes Referral Source: Self/Family/Friend Insurance type: None  Medical Screening Exam ([place]) Medical Exam completed: Yes  Crisis Care Plan Living Arrangements: Other relatives (parents) Name of Psychiatrist: [facility] Name of Therapist: Clide Deutscher  Education Status Is patient currently in school?: No Current Grade: na Highest grade of school patient has completed: [employer] Name of school: na Contact person: na  Risk to self with the past 6 months Suicidal Ideation: Yes-Currently Present Has patient been a risk to self within the past 6 months prior to admission? : Yes Suicidal Intent: Yes-Currently Present Has patient had any suicidal intent within the past 6 months prior to admission? : Yes Is patient at risk for suicide?: Yes Suicidal Plan?: Yes-Currently Present Has patient had any suicidal plan within the past 6 months prior to admission? : Yes Specify Current Suicidal Plan: Alcohol/ Substance Abuse Overdose Access to Means: Yes Specify Access to Suicidal Means: Yes; pt has access to alcohol/ drugs What has been your use of drugs/alcohol within the last 12 months?: Daily user of alcohol, cigerettes, cocaine Previous Attempts/Gestures: Yes How many times?: 6 Other Self Harm Risks: Cutting & punching walls Triggers for Past Attempts: Family contact ("nightmares") Intentional Self Injurious  Behavior: Cutting, Damaging Comment - Self Injurious Behavior: Pt. has Superficial lacerations to arm Family Suicide History: Unknown (pt. was adopted) Recent stressful life event(s): Conflict (Comment), Other (Comment) (finding out about birth parents) Persecutory voices/beliefs?: No Depression: Yes Depression Symptoms: Insomnia, Fatigue, Feeling angry/irritable Substance abuse history and/or treatment for substance abuse?: Yes Suicide prevention information given to non-admitted patients: Yes  Risk to Others within the past 6 months Homicidal Ideation: Yes-Currently Present Does patient have any lifetime risk of violence toward others beyond the six months prior to admission? : Yes (comment) ("burn everyone in this world to dust") Thoughts of Harm to Others: Yes-Currently Present Comment - Thoughts of Harm to Others: "burn everyone in this world to dust" Current Homicidal Intent: No Current Homicidal Plan: No Access to Homicidal Means: No Identified Victim: not speficified History of harm to others?: No Assessment of Violence: None Noted Violent Behavior Description: none noted Does patient have access to weapons?: Yes (Comment) (parents have them locked b/c of him) Criminal Charges Pending?: No Does patient have a court date: No Is patient on probation?: No  Psychosis Hallucinations: None noted Delusions: None noted  Mental Status Report Appearance/Hygiene: Bizarre, Disheveled, Poor hygiene, In scrubs Eye Contact: Good Motor Activity: Agitation, Restlessness, Shuffling Speech: Argumentative, Soft Level of Consciousness: Restless, Irritable Mood: Anxious, Depressed, Suspicious, Irritable Affect: Anxious, Depressed, Irritable Anxiety Level: Severe Thought Processes: Relevant, Coherent Judgement: Impaired Orientation: Person, Place, Time, Situation, Appropriate for developmental age Obsessive Compulsive Thoughts/Behaviors: None  Cognitive Functioning Concentration:  Normal  Memory: Recent Intact, Remote Impaired IQ: Average Insight: Poor Impulse Control: Poor Appetite: Poor Weight Loss: 25 Weight Gain: 0 Sleep: Decreased Total Hours of Sleep: 2 Vegetative Symptoms: None  ADLScreening [facility] Assessment Services) Patient's cognitive ability adequate to safely complete daily activities?: Yes Patient able to express need for assistance with ADLs?: Yes Independently performs ADLs?: Yes (appropriate for developmental age)  Prior Inpatient Therapy Prior Inpatient Therapy: Yes Prior Therapy Dates: 6 months Prior Therapy Facilty/Provider(s): [facility] Reason for Treatment: SI  Prior Outpatient Therapy Prior Outpatient Therapy: Yes Prior Therapy Dates:  (yes, has an appt today) Prior Therapy Facilty/Provider(s): Clide Deutscher Reason for Treatment: depression, substance abuse, anxiety Does patient have an ACCT team?: No Does patient have Intensive In-House Services?  : No Does patient have [facility] services? : No Does patient have P4CC services?: No  ADL Screening (condition at time of admission) Patient's cognitive ability adequate to safely complete daily activities?: Yes Patient able to express need for assistance with ADLs?: Yes Independently performs ADLs?: Yes (appropriate for developmental age)       Abuse/Neglect Assessment (Assessment to be complete while patient is alone) Physical Abuse: Denies Verbal Abuse: Denies Sexual Abuse: Denies Exploitation of patient/patient's resources: Denies Self-Neglect: Denies Values / Beliefs Cultural Requests During Hospitalization: None Spiritual Requests During Hospitalization: None Consults Spiritual Care Consult Needed: No Social Work Consult Needed: No Regulatory affairs officer (For Healthcare) Does patient have an advance directive?: No Would patient like information on creating an advanced directive?: No - patient declined information    Additional Information 1:1 In Past 12 Months?: No CIRT  Risk: No Elopement Risk: No Does patient have medical clearance?: Yes     Disposition:  Disposition Initial Assessment Completed for this Encounter: Yes Disposition of Patient: Other dispositions (Psych MD Consult) Other disposition(s): Other (Comment) (Psych MD Consult)  On Site Evaluation by:   Reviewed with Physician:    Michael [name] 10/09/2014 1:06 PM

## 2014-10-09 NOTE — ED Notes (Signed)

## 2014-10-09 NOTE — ED Provider Notes (Signed)
[facility] Emergency Department Provider Note  ____________________________________________  Time seen: 6:00 AM  I have reviewed the triage vital signs and the nursing notes.   HISTORY  Chief Complaint Drug Overdose and Suicide Attempt      HPI Michael [name] is a 24 y.o. male resents with polysubstance abuse. Patient states that he is been drinking alcohol and using heroin. Patient admits to suicidal ideation. Patient admits to self-inflicted abrasions to his bilateral arms.    Past medical history Polysubstance abuse There are no active problems to display for this patient.   No past surgical history on file.  Current Outpatient Rx  Name  Route  Sig  Dispense  Refill  . amoxicillin (AMOXIL) 500 MG capsule   Oral   Take 1 capsule by mouth every 6 (six) hours.      0   . clonazePAM (KLONOPIN) 1 MG tablet   Oral   Take 3 tablets by mouth daily.      0   . cloNIDine (CATAPRES) 0.1 MG tablet   Oral   Take 1 tablet by mouth 4 (four) times daily as needed.      0   . divalproex (DEPAKOTE ER) 250 MG 24 hr tablet   Oral   Take 250 mg by mouth at bedtime.      0   . OLANZapine (ZYPREXA) 5 MG tablet   Oral   Take 1 tablet by mouth at bedtime.         Marland Kitchen OLANZapine-FLUoxetine (SYMBYAX) 12-25 MG per capsule   Oral   Take 1 capsule by mouth daily.      0   . zaleplon (SONATA) 10 MG capsule   Oral   Take 1 capsule by mouth at bedtime.      0     Allergies Serotonin reuptake inhibitors (ssris); Gabapentin; Prednisone; and Latuda  No family history on file.  Social History Social History  Substance Use Topics  . Smoking status: Not on file  . Smokeless tobacco: Not on file  . Alcohol Use: Not on file    Review of Systems  Constitutional: Negative for fever. Eyes: Negative for visual changes. ENT: Negative for sore throat. Cardiovascular: Negative for chest pain. Respiratory: Negative for shortness of  breath. Gastrointestinal: Negative for abdominal pain, vomiting and diarrhea. Genitourinary: Negative for dysuria. Musculoskeletal: Negative for back pain. Skin: Negative for rash. Superficial abrasions to bilateral upper extremity Neurological: Negative for headaches, focal weakness or numbness. Psychiatric: Polysubstance abuse, suicidal ideation  10-point ROS otherwise negative.  ____________________________________________   PHYSICAL EXAM:  VITAL SIGNS: ED Triage Vitals  Enc Vitals Group     BP 10/09/14 0538 134/90 mmHg     Pulse Rate 10/09/14 0538 106     Resp 10/09/14 0538 14     Temp 10/09/14 0538 98.2 F (36.8 C)     Temp Source 10/09/14 0538 Oral     SpO2 10/09/14 0538 98 %     Weight 10/09/14 0538 190 lb (86.183 kg)     Height 10/09/14 0538 5\' 10"  (1.778 m)     Head Cir --      Peak Flow --      Pain Score --      Pain Loc --      Pain Edu? --      Excl. in [place]? --     Constitutional: Alert and oriented. Well appearing and in no distress. Eyes: Conjunctivae are normal. PERRL. Normal extraocular movements.  ENT   Head: Normocephalic and atraumatic.   Nose: No congestion/rhinnorhea.   Mouth/Throat: Mucous membranes are moist.   Neck: No stridor. Hematological/Lymphatic/Immunilogical: No cervical lymphadenopathy. Cardiovascular: Normal rate, regular rhythm. Normal and symmetric distal pulses are present in all extremities. No murmurs, rubs, or gallops. Respiratory: Normal respiratory effort without tachypnea nor retractions. Breath sounds are clear and equal bilaterally. No wheezes/rales/rhonchi. Gastrointestinal: Soft and nontender. No distention. There is no CVA tenderness. Genitourinary: deferred Musculoskeletal: Nontender with normal range of motion in all extremities. No joint effusions.  No lower extremity tenderness nor edema. Neurologic:  Normal speech and language. No gross focal neurologic deficits are appreciated. Speech is normal.  Skin:   Skin is warm, dry and intact. No rash noted. Superficial abrasions noted to bilateral upper extremity Psychiatric: Mood and affect are normal. Speech and behavior are normal. Patient exhibits appropriate insight and judgment.  ____________________________________________    LABS (pertinent positives/negatives)  Labs Reviewed  ETHANOL - Abnormal; Notable for the following:    Alcohol, Ethyl (B) 193 (*)    All other components within normal limits  URINE DRUG SCREEN, QUALITATIVE ([facility] ONLY) - Abnormal; Notable for the following:    Amphetamines, Ur Screen POSITIVE (*)    Cannabinoid 50 Ng, Ur [place] POSITIVE (*)    Benzodiazepine, Ur Scrn POSITIVE (*)    All other components within normal limits  CBC - Abnormal; Notable for the following:    WBC 15.6 (*)    All other components within normal limits  COMPREHENSIVE METABOLIC PANEL - Abnormal; Notable for the following:    Glucose, Bld 103 (*)    BUN <5 (*)    All other components within normal limits  LIPASE, BLOOD - Abnormal; Notable for the following:    Lipase 12 (*)    All other components within normal limits        INITIAL IMPRESSION / ASSESSMENT AND PLAN / ED COURSE  Pertinent labs & imaging results that were available during my care of the patient were reviewed by me and considered in my medical decision making (see chart for details).    ____________________________________________   FINAL CLINICAL IMPRESSION(S) / ED DIAGNOSES  Final diagnoses:  Alcohol abuse      Gregor Hams, MD 10/12/14 2352

## 2014-10-09 NOTE — ED Notes (Signed)
BEHAVIORAL HEALTH ROUNDING Patient sleeping: Yes.   Patient alert and oriented: not applicable Behavior appropriate: Yes.   Nutrition and fluids offered: Yes  Toileting and hygiene offered: Yes  Sitter present: yes Law enforcement present: Yes  

## 2014-10-09 NOTE — Discharge Instructions (Signed)
Return to the emergency department for any new or worsening condition including altered mental status, confusion, fever, seizure, depression, thoughts of wanting to hurt herself or others, or any other symptoms concerning to you.  You need to follow up with your psychiatrist as soon as possible next week.   Alcohol Use Disorder Alcohol use disorder is a mental disorder. It is not a one-time incident of heavy drinking. Alcohol use disorder is the excessive and uncontrollable use of alcohol over time that leads to problems with functioning in one or more areas of daily living. People with this disorder risk harming themselves and others when they drink to excess. Alcohol use disorder also can cause other mental disorders, such as mood and anxiety disorders, and serious physical problems. People with alcohol use disorder often misuse other drugs.  Alcohol use disorder is common and widespread. Some people with this disorder drink alcohol to cope with or escape from negative life events. Others drink to relieve chronic pain or symptoms of mental illness. People with a family history of alcohol use disorder are at higher risk of losing control and using alcohol to excess.  SYMPTOMS  Signs and symptoms of alcohol use disorder may include the following:   Consumption ofalcohol inlarger amounts or over a longer period of time than intended.  Multiple unsuccessful attempts to cutdown or control alcohol use.   A great deal of time spent obtaining alcohol, using alcohol, or recovering from the effects of alcohol (hangover).  A strong desire or urge to use alcohol (cravings).   Continued use of alcohol despite problems at work, school, or home because of alcohol use.   Continued use of alcohol despite problems in relationships because of alcohol use.  Continued use of alcohol in situations when it is physically hazardous, such as driving a car.  Continued use of alcohol despite awareness of a  physical or psychological problem that is likely related to alcohol use. Physical problems related to alcohol use can involve the brain, heart, liver, stomach, and intestines. Psychological problems related to alcohol use include intoxication, depression, anxiety, psychosis, delirium, and dementia.   The need for increased amounts of alcohol to achieve the same desired effect, or a decreased effect from the consumption of the same amount of alcohol (tolerance).  Withdrawal symptoms upon reducing or stopping alcohol use, or alcohol use to reduce or avoid withdrawal symptoms. Withdrawal symptoms include:  Racing heart.  Hand tremor.  Difficulty sleeping.  Nausea.  Vomiting.  Hallucinations.  Restlessness.  Seizures. DIAGNOSIS Alcohol use disorder is diagnosed through an assessment by your health care provider. Your health care provider may start by asking three or four questions to screen for excessive or problematic alcohol use. To confirm a diagnosis of alcohol use disorder, at least two symptoms must be present within a 33-month period. The severity of alcohol use disorder depends on the number of symptoms:  Mild--two or three.  Moderate--four or five.  Severe--six or more. Your health care provider may perform a physical exam or use results from lab tests to see if you have physical problems resulting from alcohol use. Your health care provider may refer you to a mental health professional for evaluation. TREATMENT  Some people with alcohol use disorder are able to reduce their alcohol use to low-risk levels. Some people with alcohol use disorder need to quit drinking alcohol. When necessary, mental health professionals with specialized training in substance use treatment can help. Your health care provider can help you decide how  severe your alcohol use disorder is and what type of treatment you need. The following forms of treatment are available:   Detoxification.  Detoxification involves the use of prescription medicines to prevent alcohol withdrawal symptoms in the first week after quitting. This is important for people with a history of symptoms of withdrawal and for heavy drinkers who are likely to have withdrawal symptoms. Alcohol withdrawal can be dangerous and, in severe cases, cause death. Detoxification is usually provided in a hospital or in-patient substance use treatment facility.  Counseling or talk therapy. Talk therapy is provided by substance use treatment counselors. It addresses the reasons people use alcohol and ways to keep them from drinking again. The goals of talk therapy are to help people with alcohol use disorder find healthy activities and ways to cope with life stress, to identify and avoid triggers for alcohol use, and to handle cravings, which can cause relapse.  Medicines.Different medicines can help treat alcohol use disorder through the following actions:  Decrease alcohol cravings.  Decrease the positive reward response felt from alcohol use.  Produce an uncomfortable physical reaction when alcohol is used (aversion therapy).  Support groups. Support groups are run by people who have quit drinking. They provide emotional support, advice, and guidance. These forms of treatment are often combined. Some people with alcohol use disorder benefit from intensive combination treatment provided by specialized substance use treatment centers. Both inpatient and outpatient treatment programs are available. Document Released: 03/16/2004 Document Revised: 06/23/2013 Document Reviewed: 05/16/2012 [facility] Patient Information 2015 [place], [place]. This information is not intended to replace advice given to you by your health care provider. Make sure you discuss any questions you have with your health care provider.

## 2014-10-09 NOTE — ED Notes (Signed)
Pt sleeping.  Breathing even and unlabored.  NAD.

## 2014-10-09 NOTE — ED Notes (Signed)
Pt refused to urinate.  Said he couldn't.  Blood labs drawn/sent.  Pt dressed in behavioral scrubs.  Personal belongings (clothes/shoes/grey cigarette lighter in right pocket of jeans) placed in pt belongings bag and taken to [place] [facility].

## 2014-10-09 NOTE — ED Notes (Signed)
BEHAVIORAL HEALTH ROUNDING Patient sleeping: No. Patient alert and oriented: alert; not oriented Behavior appropriate: Yes.  ; If no, describe:  Nutrition and fluids offered: Yes  Toileting and hygiene offered: Yes  Sitter present: not applicable Law enforcement present: Yes

## 2014-10-09 NOTE — ED Notes (Signed)
BEHAVIORAL HEALTH ROUNDING Patient sleeping: No. Patient alert and oriented: yes Behavior appropriate: Yes.  ;  Nutrition and fluids offered: Yes  Toileting and hygiene offered: Yes  Sitter present: yes Law enforcement present: Yes  

## 2014-10-09 NOTE — ED Notes (Signed)
Pt to ED with [facility] with IVC papers on the way. Pt reports taking Klonopin with ETOH. Pt states he has been suicidal over multiple days. Pt reports cutting his arms and presents with many superficial lacerations that have scabbed over on bilateral arms.

## 2014-10-09 NOTE — [facility] Counselor (Signed)
Counselor called patient's parents, Britt Petroni ([phone]) and Dyke Brackett ([phone]) to collect collateral information on patient.

## 2014-10-09 NOTE — Consult Note (Signed)
[facility] Consult   Reason for Consult:  Consult for this 24 year old man with a history of abuse of alcohol and multiple other drugs and comes into the hospital intoxicated and having made suicidal statements Referring Physician:  Reita Cliche Patient Identification: Michael [name] MRN:  [id number] Principal Diagnosis: Substance induced mood disorder Diagnosis:   Patient Active Problem List   Diagnosis Date Noted  . Substance induced mood disorder [F19.94] 10/09/2014  . Alcohol abuse [F10.10] 10/09/2014  . Marijuana abuse [F12.10] 10/09/2014  . Bipolar disorder [F31.9] 10/09/2014    Total Time spent with patient: 1 hour  Subjective:   Michael [name] is a 24 y.o. male patient admitted with "I've been under a lot of stress and I've been drinking a lot".  HPI:  Information from the patient and the chart. Patient states that over the last several days his stress level is been very high. He has been pursuing discovering who his biological parents were in there is been some kind of hanging up with that that has upset him. He has been drinking very heavily estimating that he's consumed a gallon of whiskey over the last couple days. He has been feeling suicidal and was doing some cutting up until 3 or 4 days ago. Also says that he used some heroin a few days ago and occasional cocaine. Mood continues to be down much of the time and agitated. Sleep is been poor. He tells me however today that he has no wish to actually kill himself. He says that he is thinking more clearly now and is able to cite multiple positive things in his life to live for. Understands that his substance abuse is a primary problem that is out of control. He has been compliant with his normally prescribed medicine.  Past psychiatric history: Long history of abuse of alcohol and multiple other drugs. Bipolar disorder. Positive history of suicide attempts. Positive history of  prior hospitalizations. Currently  has an outpatient psychiatrist and therapist in [place]. Prescribes Seroquel, Symbyax, clonidine and Klonopin.  Substance abuse history: Patient has a history of abusing multiple drugs including alcohol opiates cocaine and marijuana. Does not have a clear-cut history of DTs in the past.  Medical history: No significant ongoing medical problems outside of his substance abuse.  Family history: He is adopted and really doesn't know anything about his biological family history at this point.  Social history: Lives with his adopted parents. Not working.  Current medication: Seroquel Klonopin clonidine SymbyaxHPI Elements:   Quality:  intoxication with agitation and passing suicidal thoughts. Severity:  severe at the time. Timing:  bad over the last couple days. Duration:  improved now while he is sober. Context:  stress related to his family of origin.  Past Medical History: History reviewed. No pertinent past medical history. History reviewed. No pertinent past surgical history. Family History: History reviewed. No pertinent family history. Social History:  History  Alcohol Use: Not on file     History  Drug Use Not on file    Social History   Social History  . Marital Status: Single    Spouse Name: N/A  . Number of Children: N/A  . Years of Education: N/A   Social History Main Topics  . Smoking status: Current Every Day Smoker -- 1.00 packs/day    Types: Cigarettes  . Smokeless tobacco: None  . Alcohol Use: None  . Drug Use: None  . Sexual Activity: Not Asked   Other Topics Concern  . None  Social History Narrative   Additional Social History:    Pain Medications: None Reported Prescriptions: None Reported Over the Counter: None Reported History of alcohol / drug use?: Yes Longest period of sobriety (when/how long): not since he was 14 Negative Consequences of Use: Financial, Personal relationships Withdrawal Symptoms: Agitation, Fever / Chills, Nausea / Vomiting,  Tremors, Irritability, Weakness Name of Substance 1: Alcohol  1 - Age of First Use: 12 1 - Amount (size/oz): 1/2 gallon of bourbon 1 - Frequency: everyday 1 - Duration: years 1 - Last Use / Amount: 10/08/14 Name of Substance 2: Cocaine 2 - Age of First Use: 17 2 - Amount (size/oz): gram 2 - Frequency: every other day 2 - Duration: 6 years 2 - Last Use / Amount: 10/08/14 Name of Substance 3: Herion 3 - Age of First Use: 19 3 - Amount (size/oz): 1/2 gram 3 - Frequency: "on & off" 3 - Duration: 4 years 3 - Last Use / Amount: 10/08/14               Allergies:   Allergies  Allergen Reactions  . Serotonin Reuptake Inhibitors (Ssris) Rash  . Gabapentin Nausea Only and Nausea And Vomiting  . Prednisone Other (See Comments)    Difficulty breathing  . Latuda [Lurasidone Hcl] Rash and Nausea And Vomiting    Labs:  Results for orders placed or performed during the hospital encounter of 10/09/14 (from the past 48 hour(s))  CBC     Status: Abnormal   Collection Time: 10/09/14  5:21 AM  Result Value Ref Range   WBC 15.6 (H) 3.8 - 10.6 K/uL   RBC 5.22 4.40 - 5.90 MIL/uL   Hemoglobin 15.9 13.0 - 18.0 g/dL   HCT 46.9 40.0 - 52.0 %   MCV 89.8 80.0 - 100.0 fL   MCH 30.5 26.0 - 34.0 pg   MCHC 33.9 32.0 - 36.0 g/dL   RDW 13.8 11.5 - 14.5 %   Platelets 250 150 - 440 K/uL  Comprehensive metabolic panel     Status: Abnormal   Collection Time: 10/09/14  5:21 AM  Result Value Ref Range   Sodium 143 135 - 145 mmol/L   Potassium 3.6 3.5 - 5.1 mmol/L   Chloride 107 101 - 111 mmol/L   CO2 24 22 - 32 mmol/L   Glucose, Bld 103 (H) 65 - 99 mg/dL   BUN <5 (L) 6 - 20 mg/dL   Creatinine, Ser 0.83 0.61 - 1.24 mg/dL   Calcium 9.2 8.9 - 10.3 mg/dL   Total Protein 8.1 6.5 - 8.1 g/dL   Albumin 4.8 3.5 - 5.0 g/dL   AST 22 15 - 41 U/L   ALT 27 17 - 63 U/L   Alkaline Phosphatase 81 38 - 126 U/L   Total Bilirubin 0.4 0.3 - 1.2 mg/dL   GFR calc non Af Amer >60 >60 mL/min   GFR calc Af Amer >60 >60  mL/min    Comment: (NOTE) The eGFR has been calculated using the CKD EPI equation. This calculation has not been validated in all clinical situations. eGFR's persistently <60 mL/min signify possible Chronic Kidney Disease.    Anion gap 12 5 - 15  Lipase, blood     Status: Abnormal   Collection Time: 10/09/14  5:21 AM  Result Value Ref Range   Lipase 12 (L) 22 - 51 U/L  Ethanol (ETOH)     Status: Abnormal   Collection Time: 10/09/14  5:42 AM  Result Value Ref Range  Alcohol, Ethyl (B) 193 (H) <5 mg/dL    Comment:        LOWEST DETECTABLE LIMIT FOR SERUM ALCOHOL IS 5 mg/dL FOR MEDICAL PURPOSES ONLY   Urine Drug Screen, Qualitative ([facility] only)     Status: Abnormal   Collection Time: 10/09/14  9:07 AM  Result Value Ref Range   Tricyclic, Ur Screen NONE DETECTED NONE DETECTED   Amphetamines, Ur Screen POSITIVE (A) NONE DETECTED   MDMA (Ecstasy)Ur Screen NONE DETECTED NONE DETECTED   Cocaine Metabolite,Ur [place] NONE DETECTED NONE DETECTED   Opiate, Ur Screen NONE DETECTED NONE DETECTED   Phencyclidine (PCP) Ur S NONE DETECTED NONE DETECTED   Cannabinoid 50 Ng, Ur [place] POSITIVE (A) NONE DETECTED   Barbiturates, Ur Screen NONE DETECTED NONE DETECTED   Benzodiazepine, Ur Scrn POSITIVE (A) NONE DETECTED   Methadone Scn, Ur NONE DETECTED NONE DETECTED    Comment: (NOTE) 573  Tricyclics, urine               Cutoff 1000 ng/mL 200  Amphetamines, urine             Cutoff 1000 ng/mL 300  MDMA (Ecstasy), urine           Cutoff 500 ng/mL 400  Cocaine Metabolite, urine       Cutoff 300 ng/mL 500  Opiate, urine                   Cutoff 300 ng/mL 600  Phencyclidine (PCP), urine      Cutoff 25 ng/mL 700  Cannabinoid, urine              Cutoff 50 ng/mL 800  Barbiturates, urine             Cutoff 200 ng/mL 900  Benzodiazepine, urine           Cutoff 200 ng/mL 1000 Methadone, urine                Cutoff 300 ng/mL 1100 1200 The urine drug screen provides only a preliminary, unconfirmed 1300  analytical test result and should not be used for non-medical 1400 purposes. Clinical consideration and professional judgment should 1500 be applied to any positive drug screen result due to possible 1600 interfering substances. A more specific alternate chemical method 1700 must be used in order to obtain a confirmed analytical result.  1800 Gas chromato graphy / mass spectrometry (GC/MS) is the preferred 1900 confirmatory method.     Vitals: Blood pressure 154/93, pulse 90, temperature 97.7 F (36.5 C), temperature source Oral, resp. rate 18, height _0  (1.778 m), weight 86.183 kg (190 lb), SpO2 96 %.  Risk to Self: Suicidal Ideation: Yes-Currently Present Suicidal Intent: Yes-Currently Present Is patient at risk for suicide?: Yes Suicidal Plan?: Yes-Currently Present Specify Current Suicidal Plan: Alcohol/ Substance Abuse Overdose Access to Means: Yes Specify Access to Suicidal Means: Yes; pt has access to alcohol/ drugs What has been your use of drugs/alcohol within the last 12 months?: Daily user of alcohol, cigerettes, cocaine How many times?: 6 Other Self Harm Risks: Cutting & punching walls Triggers for Past Attempts: Family contact ("nightmares") Intentional Self Injurious Behavior: Cutting, Damaging Comment - Self Injurious Behavior: Pt. has Superficial lacerations to arm Risk to Others: Homicidal Ideation: Yes-Currently Present Thoughts of Harm to Others: Yes-Currently Present Comment - Thoughts of Harm to Others: "burn everyone in this world to dust" Current Homicidal Intent: No Current Homicidal Plan: No Access to Homicidal Means: No Identified Victim:  not speficified History of harm to others?: No Assessment of Violence: None Noted Violent Behavior Description: none noted Does patient have access to weapons?: Yes (Comment) (parents have them locked b/c of him) Criminal Charges Pending?: No Does patient have a court date: No Prior Inpatient Therapy: Prior  Inpatient Therapy: Yes Prior Therapy Dates: 6 months Prior Therapy Facilty/Provider(s): [facility] Reason for Treatment: SI Prior Outpatient Therapy: Prior Outpatient Therapy: Yes Prior Therapy Dates:  (yes, has an appt today) Prior Therapy Facilty/Provider(s): [facility] Reason for Treatment: depression, substance abuse, anxiety Does patient have an ACCT team?: No Does patient have Intensive In-House Services?  : No Does patient have [facility] services? : No Does patient have [facility] services?: No  Current Facility-Administered Medications  Medication Dose Route Frequency Provider Last Rate Last Dose  . LORazepam (ATIVAN) injection 0-4 mg  0-4 mg Intravenous 4 times per day Delman Kitten, MD      . LORazepam (ATIVAN) injection 0-4 mg  0-4 mg Intravenous Q12H Delman Kitten, MD      . LORazepam (ATIVAN) tablet 0-4 mg  0-4 mg Oral 4 times per day Delman Kitten, MD   2 mg at 10/09/14 1658  . LORazepam (ATIVAN) tablet 0-4 mg  0-4 mg Oral Q12H Delman Kitten, MD   2 mg at 10/09/14 1146  . thiamine (B-1) injection 100 mg  100 mg Intravenous Daily Delman Kitten, MD      . thiamine (VITAMIN B-1) tablet 100 mg  100 mg Oral Daily Delman Kitten, MD   100 mg at 10/09/14 1146   Current Outpatient Prescriptions  Medication Sig Dispense Refill  . amoxicillin (AMOXIL) 500 MG capsule Take 1 capsule by mouth every 6 (six) hours.  0  . clonazePAM (KLONOPIN) 1 MG tablet Take 3 tablets by mouth daily.  0  . cloNIDine (CATAPRES) 0.1 MG tablet Take 1 tablet by mouth 4 (four) times daily as needed.  0  . divalproex (DEPAKOTE ER) 250 MG 24 hr tablet Take 250 mg by mouth at bedtime.  0  . OLANZapine (ZYPREXA) 5 MG tablet Take 1 tablet by mouth at bedtime.    Marland Kitchen OLANZapine-FLUoxetine (SYMBYAX) 12-25 MG per capsule Take 1 capsule by mouth daily.  0  . zaleplon (SONATA) 10 MG capsule Take 1 capsule by mouth at bedtime.  0    Musculoskeletal: Strength & Muscle Tone: within normal limits Gait & Station: ataxic Patient leans:  N/A  Psychiatric Specialty Exam: Physical Exam  Nursing note and vitals reviewed. Constitutional: He appears well-developed and well-nourished.  HENT:  Head: Normocephalic and atraumatic.  Eyes: Conjunctivae are normal. Pupils are equal, round, and reactive to light.  Neck: Normal range of motion.  Cardiovascular: Normal heart sounds.   Respiratory: Effort normal.  GI: Soft.  Musculoskeletal: Normal range of motion.  Neurological: He is alert. He displays tremor.  Skin: Skin is warm and dry.  Psychiatric: Thought content normal. His mood appears anxious. His affect is blunt. His speech is delayed. He is slowed. Cognition and memory are normal. He expresses impulsivity.  Patient is a disheveled young man with a mild tremor who is alert and oriented and cooperative. Good eye contact. Appears to be lucid with improved insight. No current suicidal thoughts. No current psychotic symptoms    Review of Systems  Constitutional: Negative.   HENT: Negative.   Eyes: Negative.   Respiratory: Negative.   Cardiovascular: Negative.   Gastrointestinal: Positive for nausea.  Musculoskeletal: Negative.   Skin: Negative.   Neurological: Positive  for tremors.  Psychiatric/Behavioral: Positive for depression and substance abuse. Negative for suicidal ideas and hallucinations. The patient is nervous/anxious and has insomnia.     Blood pressure 154/93, pulse 90, temperature 97.7 F (36.5 C), temperature source Oral, resp. rate 18, height _0  (1.778 m), weight 86.183 kg (190 lb), SpO2 96 %.Body mass index is 27.26 kg/(m^2).  General Appearance: Disheveled  Eye Sport and exercise psychologist::  Fair  Speech:  Slow  Volume:  Normal  Mood:  Anxious  Affect:  Congruent  Thought Process:  Goal Directed  Orientation:  Full (Time, Place, and Person)  Thought Content:  Negative  Suicidal Thoughts:  No  Homicidal Thoughts:  No  Memory:  Immediate;   Good Recent;   Fair Remote;   Good  Judgement:  Intact  Insight:  Fair   Psychomotor Activity:  Decreased and Tremor  Concentration:  Fair  Recall:  [employer] of Knowledge:Fair  Language: Good  Akathisia:  No  Handed:  Right  AIMS (if indicated):     Assets:  Communication Skills Desire for Improvement Housing Physical Health  ADL's:  Intact  Cognition: WNL  Sleep:      Medical Decision Making: Review of Psycho-Social Stressors (1), Review or order clinical lab tests (1), Established Problem, Worsening (2), Review or order medicine tests (1), Review of Medication Regimen & Side Effects (2) and Review of New Medication or Change in Dosage (2)  Treatment Plan Summary: Medication management and Plan patient currently is lucid and denying suicidal ideation. Has appropriate plan for the future. States good insight into his need to stop substance abuse. He is not currently delirious. Case reviewed with emergency room physician. He will be discharged back home to live with his parents. He is to continue his current outpatient psychiatric medicine. He has been educated about the crucial importance of remaining sober. Patient agrees to the plan. Can be taken off commitment and discharged from the emergency room.  Plan:  Patient does not meet criteria for psychiatric inpatient admission. Supportive therapy provided about ongoing stressors. Discussed crisis plan, support from social network, calling 911, coming to the Emergency Department, and calling Suicide Hotline. Disposition: discharge from the emergency room with follow-up with his usual outpatient providers  Leza Apsey 10/09/2014 6:11 PM

## 2014-10-09 NOTE — ED Notes (Signed)
ED B[facility] [place] Is the patient under IVC or is there intent for IVC: Yes.   Is the patient medically cleared: Yes.   Is there vacancy in the ED BHU: Yes.   Is the population mix appropriate for patient: Yes.   Is the patient awaiting placement in inpatient or outpatient setting: Yes.   Has the patient had a psychiatric consult: No. Survey of unit performed for contraband, proper placement and condition of furniture, tampering with fixtures in bathroom, shower, and each patient room: Yes.  ; Findings: all clear APPEARANCE/BEHAVIOR calm, cooperative and adequate rapport can be established NEURO ASSESSMENT Orientation: time, place and person Hallucinations: No.None noted (Hallucinations) Speech: Normal Gait: normal RESPIRATORY ASSESSMENT Normal expansion.  Clear to auscultation.  No rales, rhonchi, or wheezing. CARDIOVASCULAR ASSESSMENT regular rate and rhythm, S1, S2 normal, no murmur, click, rub or gallop GASTROINTESTINAL ASSESSMENT soft, nontender, BS WNL, no r/g EXTREMITIES normal strength, tone, and muscle mass, ROM of all joints is normal PLAN OF CARE Provide calm/safe environment. Vital signs assessed twice daily. ED BHU Assessment once each 12-hour shift. Collaborate with intake RN daily or as condition indicates. Assure the ED provider has rounded once each shift. Provide and encourage hygiene. Provide redirection as needed. Assess for escalating behavior; address immediately and inform ED provider.  Assess family dynamic and appropriateness for visitation as needed: Yes.  ; If necessary, describe findings:  Educate the patient/family about BHU procedures/visitation: Yes.  ; If necessary, describe findings: all clear

## 2014-10-09 NOTE — ED Notes (Signed)
BEHAVIORAL HEALTH ROUNDING Patient sleeping: Yes.   Patient alert and oriented: pt sleeping Behavior appropriate: Yes.  ; If no, describe:  Nutrition and fluids offered: Yes  Toileting and hygiene offered: Yes  Sitter present: no Law enforcement present: Yes

## 2014-10-09 NOTE — ED Provider Notes (Signed)
I spoke with the consulting psychiatrist, Dr. Weber Cooks, who is recommending discharge from the emergency department, as he is no longer clinically intoxicated. He is not delirious. He is not suicidal. He lives with his parents, and will go home with them. He has outpatient psychiatric care already established, and he is to follow-up with his psychiatrist. This plan was determined by Dr. Weber Cooks with the patient.  Lisa Roca, MD 10/09/14 [phone]

## 2014-10-09 NOTE — ED Notes (Signed)
No one-to-one sitter required, per MD.  Q 15 min checks required.

## 2014-10-09 NOTE — ED Notes (Signed)
Pt reported drinking heavily during the past two days and using heroin.  Pt has superficial slice marks on bilateral forearms.  Pt admits to self-infliction.  Pt states that he has not done heroin in seven or eight months, but he relapsed due to recent life-stressors.

## 2015-06-14 ENCOUNTER — Emergency Department: Payer: Medicare Other

## 2015-06-14 ENCOUNTER — Emergency Department
Admission: EM | Admit: 2015-06-14 | Discharge: 2015-06-14 | Disposition: A | Discharge status: Home / Self Care | Payer: Medicare Other | Attending: Emergency Medicine | Admitting: Emergency Medicine

## 2015-06-14 DIAGNOSIS — F17210 Nicotine dependence, cigarettes, uncomplicated: Secondary | ICD-10-CM | POA: Diagnosis not present

## 2015-06-14 DIAGNOSIS — R1110 Vomiting, unspecified: Secondary | ICD-10-CM | POA: Diagnosis not present

## 2015-06-14 DIAGNOSIS — R1011 Right upper quadrant pain: Secondary | ICD-10-CM | POA: Diagnosis not present

## 2015-06-14 DIAGNOSIS — F1210 Cannabis abuse, uncomplicated: Secondary | ICD-10-CM | POA: Diagnosis not present

## 2015-06-14 DIAGNOSIS — R1031 Right lower quadrant pain: Secondary | ICD-10-CM | POA: Diagnosis not present

## 2015-06-14 DIAGNOSIS — R1084 Generalized abdominal pain: Secondary | ICD-10-CM

## 2015-06-14 DIAGNOSIS — F1914 Other psychoactive substance abuse with psychoactive substance-induced mood disorder: Secondary | ICD-10-CM | POA: Diagnosis not present

## 2015-06-14 DIAGNOSIS — R112 Nausea with vomiting, unspecified: Secondary | ICD-10-CM | POA: Diagnosis not present

## 2015-06-14 DIAGNOSIS — F319 Bipolar disorder, unspecified: Secondary | ICD-10-CM | POA: Diagnosis not present

## 2015-06-14 DIAGNOSIS — R197 Diarrhea, unspecified: Secondary | ICD-10-CM | POA: Diagnosis not present

## 2015-06-14 DIAGNOSIS — Z792 Long term (current) use of antibiotics: Secondary | ICD-10-CM | POA: Diagnosis not present

## 2015-06-14 DIAGNOSIS — Z79899 Other long term (current) drug therapy: Secondary | ICD-10-CM | POA: Diagnosis not present

## 2015-06-14 LAB — URINALYSIS COMPLETE WITH MICROSCOPIC (ARMC ONLY)
BACTERIA UA: NONE SEEN
Bilirubin Urine: NEGATIVE
Glucose, UA: NEGATIVE mg/dL
Hgb urine dipstick: NEGATIVE
Leukocytes, UA: NEGATIVE
Nitrite: NEGATIVE
PROTEIN: NEGATIVE mg/dL
SPECIFIC GRAVITY, URINE: 1.009 (ref 1.005–1.030)
SQUAMOUS EPITHELIAL / LPF: NONE SEEN
WBC UA: NONE SEEN WBC/hpf (ref 0–5)
pH: 7 (ref 5.0–8.0)

## 2015-06-14 LAB — COMPREHENSIVE METABOLIC PANEL
ALBUMIN: 5.2 g/dL — AB (ref 3.5–5.0)
ALT: 54 U/L (ref 17–63)
ANION GAP: 11 (ref 5–15)
AST: 28 U/L (ref 15–41)
Alkaline Phosphatase: 75 U/L (ref 38–126)
BUN: 17 mg/dL (ref 6–20)
CHLORIDE: 104 mmol/L (ref 101–111)
CO2: 23 mmol/L (ref 22–32)
Calcium: 9.9 mg/dL (ref 8.9–10.3)
Creatinine, Ser: 1.02 mg/dL (ref 0.61–1.24)
GFR calc non Af Amer: >60 mL/min (ref >60)
Glucose, Bld: 98 mg/dL (ref 65–99)
Potassium: 4 mmol/L (ref 3.5–5.1)
SODIUM: 138 mmol/L (ref 135–145)
Total Bilirubin: 0.8 mg/dL (ref 0.3–1.2)
Total Protein: 8.4 g/dL — ABNORMAL HIGH (ref 6.5–8.1)

## 2015-06-14 LAB — CBC
HEMATOCRIT: 48.1 % (ref 40.0–52.0)
HEMOGLOBIN: 16.5 g/dL (ref 13.0–18.0)
MCH: 30.6 pg (ref 26.0–34.0)
MCHC: 34.3 g/dL (ref 32.0–36.0)
MCV: 89.2 fL (ref 80.0–100.0)
Platelets: 216 10*3/uL (ref 150–440)
RBC: 5.39 MIL/uL (ref 4.40–5.90)
RDW: 13.6 % (ref 11.5–14.5)
WBC: 10.3 10*3/uL (ref 3.8–10.6)

## 2015-06-14 LAB — LIPASE, BLOOD: LIPASE: 13 U/L (ref 11–51)

## 2015-06-14 MED ORDER — DIATRIZOATE MEGLUMINE & SODIUM 66-10 % PO SOLN
15.0000 mL | Freq: Once | ORAL | Status: AC
  Administered 2015-06-14: 15 mL via ORAL

## 2015-06-14 MED ORDER — DICYCLOMINE HCL 20 MG PO TABS
ORAL_TABLET | ORAL | Status: AC
Start: 2015-06-14 — End: 2015-06-14
  Administered 2015-06-14: 20 mg via ORAL
  Filled 2015-06-14: qty 1

## 2015-06-14 MED ORDER — DICYCLOMINE HCL 20 MG PO TABS
20.0000 mg | ORAL_TABLET | Freq: Once | ORAL | Status: AC
Start: 2015-06-14 — End: 2015-06-14
  Administered 2015-06-14: 20 mg via ORAL
  Filled 2015-06-14: qty 1

## 2015-06-14 MED ORDER — ONDANSETRON HCL 4 MG/2ML IJ SOLN
4.0000 mg | Freq: Once | INTRAMUSCULAR | Status: AC
  Administered 2015-06-14: 4 mg via INTRAVENOUS
  Filled 2015-06-14: qty 2

## 2015-06-14 MED ORDER — HYDROMORPHONE HCL 1 MG/ML IJ SOLN
1.0000 mg | Freq: Once | INTRAMUSCULAR | Status: AC
  Administered 2015-06-14: 1 mg via INTRAVENOUS

## 2015-06-14 MED ORDER — PROMETHAZINE HCL 25 MG PO TABS: 25.0000 mg | ORAL_TABLET | Freq: Four times a day (QID) | ORAL | Status: DC | PRN

## 2015-06-14 MED ORDER — OXYCODONE-ACETAMINOPHEN 5-325 MG PO TABS
ORAL_TABLET | ORAL | Status: AC
  Filled 2015-06-14: qty 1

## 2015-06-14 MED ORDER — HYDROMORPHONE HCL 1 MG/ML IJ SOLN
1.0000 mg | Freq: Once | INTRAMUSCULAR | Status: AC
  Administered 2015-06-14: 1 mg via INTRAVENOUS
  Filled 2015-06-14: qty 1

## 2015-06-14 MED ORDER — HYDROMORPHONE HCL 1 MG/ML IJ SOLN
INTRAMUSCULAR | Status: AC
  Administered 2015-06-14: 1 mg via INTRAVENOUS
  Filled 2015-06-14: qty 1

## 2015-06-14 MED ORDER — FAMOTIDINE 20 MG PO TABS: 20.0000 mg | ORAL_TABLET | Freq: Two times a day (BID) | ORAL | Status: DC

## 2015-06-14 MED ORDER — DICYCLOMINE HCL 20 MG PO TABS: 20.0000 mg | ORAL_TABLET | Freq: Three times a day (TID) | ORAL | Status: DC | PRN

## 2015-06-14 MED ORDER — IOPAMIDOL (ISOVUE-300) INJECTION 61%
100.0000 mL | Freq: Once | INTRAVENOUS | Status: AC | PRN
  Administered 2015-06-14: 100 mL via INTRAVENOUS
  Filled 2015-06-14: qty 100

## 2015-06-14 MED ORDER — OXYCODONE-ACETAMINOPHEN 5-325 MG PO TABS
1.0000 | ORAL_TABLET | ORAL | Status: DC | PRN
Start: 2015-06-14 — End: 2015-06-14
  Administered 2015-06-14: 1 via ORAL

## 2015-06-14 MED ORDER — SODIUM CHLORIDE 0.9 % IV SOLN
Freq: Once | INTRAVENOUS | Status: AC
  Administered 2015-06-14: 15:00:00 via INTRAVENOUS

## 2015-06-14 NOTE — Discharge Instructions (Signed)
Diet for Irritable Bowel Syndrome When you have irritable bowel syndrome (IBS), the foods you eat and your eating habits are very important. IBS may cause various symptoms, such as abdominal pain, constipation, or diarrhea. Choosing the right foods can help ease discomfort caused by these symptoms. Work with your health care provider and dietitian to find the best eating plan to help control your symptoms. WHAT GENERAL GUIDELINES DO I NEED TO FOLLOW?  Keep a food diary. This will help you identify foods that cause symptoms. Write down:  What you eat and when.  What symptoms you have.  When symptoms occur in relation to your meals.  Avoid foods that cause symptoms. Talk with your dietitian about other ways to get the same nutrients that are in these foods.  Eat more foods that contain fiber. Take a fiber supplement if directed by your dietitian.  Eat your meals slowly, in a relaxed setting.  Aim to eat 5-6 small meals per day. Do not skip meals.  Drink enough fluids to keep your urine clear or pale yellow.  Ask your health care provider if you should take an over-the-counter probiotic during flare-ups to help restore healthy gut bacteria.  If you have cramping or diarrhea, try making your meals low in fat and high in carbohydrates. Examples of carbohydrates are pasta, rice, whole grain breads and cereals, fruits, and vegetables.  If dairy products cause your symptoms to flare up, try eating less of them. You might be able to handle yogurt better than other dairy products because it contains bacteria that help with digestion. WHAT FOODS ARE NOT RECOMMENDED? The following are some foods and drinks that may worsen your symptoms:  Fatty foods, such as [place] fries.  Milk products, such as cheese or ice cream.  Chocolate.  Alcohol.  Products with caffeine, such as coffee.  Carbonated drinks, such as soda. The items listed above may not be a complete list of foods and beverages to  avoid. Contact your dietitian for more information. WHAT FOODS ARE GOOD SOURCES OF FIBER? Your health care provider or dietitian may recommend that you eat more foods that contain fiber. Fiber can help reduce constipation and other IBS symptoms. Add foods with fiber to your diet a little at a time so that your body can get used to them. Too much fiber at once might cause gas and swelling of your abdomen. The following are some foods that are good sources of fiber:  Apples.  Peaches.  Pears.  Berries.  Figs.  Broccoli (raw).  Cabbage.  Carrots.  Raw peas.  Kidney beans.  Lima beans.  Whole grain bread.  Whole grain cereal. FOR MORE INFORMATION  International Foundation for Functional Gastrointestinal Disorders: www.iffgd.[employer] of Diabetes and Digestive and Kidney Diseases: NetworkAffair.co.za.aspx   This information is not intended to replace advice given to you by your health care provider. Make sure you discuss any questions you have with your health care provider.   Document Released: 04/29/2003 Document Revised: 02/27/2014 Document Reviewed: 05/09/2013 Elsevier Interactive Patient Education 2016 [employer]. Norovirus Infection A norovirus infection is caused by exposure to a virus in a group of similar viruses (noroviruses). This type of infection causes inflammation in your stomach and intestines (gastroenteritis). Norovirus is the most common cause of gastroenteritis. It also causes food poisoning. Anyone can get a norovirus infection. It spreads very easily (contagious). You can get it from contaminated food, water, surfaces, or other people. Norovirus is found in the stool or vomit  of infected people. You can spread the infection as soon as you feel sick until 2 weeks after you recover.  Symptoms usually begin within 2 days after you become infected. Most norovirus symptoms affect the  digestive system. CAUSES Norovirus infection is caused by contact with norovirus. You can catch norovirus if you:  Eat or drink something contaminated with norovirus.  Touch surfaces or objects contaminated with norovirus and then put your hand in your mouth.  Have direct contact with an infected person who has symptoms.  Share food, drink, or utensils with someone with who is sick with norovirus. SIGNS AND SYMPTOMS Symptoms of norovirus may include:  Nausea.  Vomiting.  Diarrhea.  Stomach cramps.  Fever.  Chills.  Headache.  Muscle aches.  Tiredness. DIAGNOSIS Your health care provider may suspect norovirus based on your symptoms and physical exam. Your health care provider may also test a sample of your stool or vomit for the virus.  TREATMENT There is no specific treatment for norovirus. Most people get better without treatment in about 2 days. HOME CARE INSTRUCTIONS  Replace lost fluids by drinking plenty of water or rehydration fluids containing important minerals called electrolytes. This prevents dehydration. Drink enough fluid to keep your urine clear or pale yellow.  Do not prepare food for others while you are infected. Wait at least 3 days after recovering from the illness to do that. PREVENTION   Wash your hands often, especially after using the toilet or changing a diaper.  Wash fruits and vegetables thoroughly before preparing or serving them.  Throw out any food that a sick person may have touched.  Disinfect contaminated surfaces immediately after someone in the household has been sick. Use a bleach-based household cleaner.  Immediately remove and wash soiled clothes or sheets. SEEK MEDICAL CARE IF:  Your vomiting, diarrhea, and stomach pain is getting worse.  Your symptoms of norovirus do not go away after 2-3 days. SEEK IMMEDIATE MEDICAL CARE IF:  You develop symptoms of dehydration that do not improve with fluid replacement. This may  include:  Excessive sleepiness.  Lack of tears.  Dry mouth.  Dizziness when standing.  Weak pulse.   This information is not intended to replace advice given to you by your health care provider. Make sure you discuss any questions you have with your health care provider.   Document Released: 04/29/2002 Document Revised: 02/27/2014 Document Reviewed: 07/17/2013 Elsevier Interactive Patient Education [employer].

## 2015-06-14 NOTE — ED Provider Notes (Signed)
[facility] Emergency Department Provider Note     Time seen: ----------------------------------------- 2:45 PM on 06/14/2015 -----------------------------------------    I have reviewed the triage vital signs and the nursing notes.   HISTORY  Chief Complaint Abdominal Pain    HPI Michael [name] is a 25 y.o. male who presents ER with right upper quadrant pain for the past 45 minutes with nausea vomiting diarrhea. Patient states she's had intermittent severe right-sided abdominal pain for the last 2 months. Nothing seemed to make his symptoms better or worse. He cannot keep anything down today.   History reviewed. No pertinent past medical history.  Patient Active Problem List   Diagnosis Date Noted  . Substance induced mood disorder ([place]) 10/09/2014  . Alcohol abuse 10/09/2014  . Marijuana abuse 10/09/2014  . Bipolar disorder ([place]) 10/09/2014    History reviewed. No pertinent past surgical history.  Allergies Bee venom; Serotonin reuptake inhibitors (ssris); Gabapentin; Prednisone; and Latuda  Social History Social History  Substance Use Topics  . Smoking status: Current Every Day Smoker -- 1.00 packs/day    Types: Cigarettes  . Smokeless tobacco: None  . Alcohol Use: Yes     Comment: social    Review of Systems Constitutional: Negative for fever. Eyes: Negative for visual changes. ENT: Negative for sore throat. Cardiovascular: Negative for chest pain. Respiratory: Negative for shortness of breath. Gastrointestinal:  positive for abdominal pain, vomiting and diarrhea  Genitourinary: Negative for dysuria. Musculoskeletal: Negative for back pain. Skin: Negative for rash. Neurological: Negative for headaches, focal weakness or numbness.  10-point ROS otherwise negative.  ____________________________________________   PHYSICAL EXAM:  VITAL SIGNS: ED Triage Vitals  Enc Vitals Group     BP 06/14/15 1247 140/99 mmHg   Pulse Rate 06/14/15 1247 87     Resp 06/14/15 1247 18     Temp 06/14/15 1247 98.5 F (36.9 C)     Temp Source 06/14/15 1247 Oral     SpO2 06/14/15 1247 99 %     Weight 06/14/15 1247 200 lb (90.719 kg)     Height 06/14/15 1247 5\' 7"  (1.702 m)     Head Cir --      Peak Flow --      Pain Score 06/14/15 1248 8     Pain Loc --      Pain Edu? --      Excl. in [place]? --     Constitutional: Alert and oriented.  mild to moderate distress  Eyes: Conjunctivae are normal. PERRL. Normal extraocular movements. ENT   Head: Normocephalic and atraumatic.   Nose: No congestion/rhinnorhea.   Mouth/Throat: Mucous membranes are moist.   Neck: No stridor. Cardiovascular: Normal rate, regular rhythm. No murmurs, rubs, or gallops. Respiratory: Normal respiratory effort without tachypnea nor retractions. Breath sounds are clear and equal bilaterally. No wheezes/rales/rhonchi. Gastrointestinal:  significant right-sided abdominal tenderness, right upper and right lower quadrant tenderness is noted. Normal bowel sounds.  Musculoskeletal: Nontender with normal range of motion in all extremities. No lower extremity tenderness nor edema. Neurologic:  Normal speech and language. No gross focal neurologic deficits are appreciated.  Skin:  Skin is warm, dry and intact. No rash noted. Psychiatric: Mood and affect are normal. Speech and behavior are normal.  ____________________________________________  ED COURSE:  Pertinent labs & imaging results that were available during my care of the patient were reviewed by me and considered in my medical decision making (see chart for details).  patients seems to be having significant pain  and does have significant tenderness. I will start with an ultrasound, check basic labs and give IV antiemetics with pain medicine.  ____________________________________________    LABS (pertinent positives/negatives)  Labs Reviewed  COMPREHENSIVE METABOLIC PANEL - Abnormal;  Notable for the following:    Total Protein 8.4 (*)    Albumin 5.2 (*)    All other components within normal limits  LIPASE, BLOOD  CBC  URINALYSIS COMPLETEWITH MICROSCOPIC ([facility] ONLY)    RADIOLOGY Right upper quadrant ultrasound  IMPRESSION: Mild wall thickening of the rectum without adjacent inflammatory change or free fluid. Findings may due to mild proctitis. Otherwise, no acute findings.  IMPRESSION: Unremarkable right upper quadrant ultrasound.  _______________________________________  FINAL ASSESSMENT AND PLAN   Abdominal pain, vomiting and diarrhea  Plan: Patient with labs and imaging as dictated above. Patient essentially with acute on chronic GI symptoms. There is likely a viral component in addition to IBS or other chronic abdominal issue. He'll be referred to gastroenterology for follow-up.   Earleen Newport, MD   Earleen Newport, MD 06/14/15 [phone]

## 2015-06-14 NOTE — ED Notes (Addendum)
Pt c/o RUQ pain for the past 87min with N/V/D, states he has had this pain intermittent for the past 2 months.Marland Kitchen

## 2015-06-21 ENCOUNTER — Ambulatory Visit
Admission: RE | Admit: 2015-06-21 | Discharge: 2015-06-21 | Disposition: A | Discharge status: Home / Self Care | Payer: Medicare Other | Source: Ambulatory Visit | Attending: Internal Medicine | Admitting: Internal Medicine

## 2015-06-21 ENCOUNTER — Encounter: Payer: Self-pay | Admitting: Internal Medicine

## 2015-06-21 ENCOUNTER — Telehealth: Payer: Self-pay

## 2015-06-21 ENCOUNTER — Ambulatory Visit (INDEPENDENT_AMBULATORY_CARE_PROVIDER_SITE_OTHER): Payer: Medicare Other | Admitting: Internal Medicine

## 2015-06-21 VITALS — BP 132/100 | HR 83 | Ht 67.0 in | Wt 199.6 lb

## 2015-06-21 DIAGNOSIS — K6289 Other specified diseases of anus and rectum: Secondary | ICD-10-CM | POA: Insufficient documentation

## 2015-06-21 DIAGNOSIS — F329 Major depressive disorder, single episode, unspecified: Secondary | ICD-10-CM

## 2015-06-21 DIAGNOSIS — R1084 Generalized abdominal pain: Secondary | ICD-10-CM | POA: Insufficient documentation

## 2015-06-21 DIAGNOSIS — F32A Depression, unspecified: Secondary | ICD-10-CM

## 2015-06-21 DIAGNOSIS — R079 Chest pain, unspecified: Secondary | ICD-10-CM | POA: Diagnosis not present

## 2015-06-21 MED ORDER — PROMETHAZINE HCL 25 MG PO TABS: 25.0000 mg | ORAL_TABLET | Freq: Four times a day (QID) | ORAL | Status: DC | PRN

## 2015-06-21 MED ORDER — PANTOPRAZOLE SODIUM 40 MG PO TBEC
40.0000 mg | DELAYED_RELEASE_TABLET | Freq: Two times a day (BID) | ORAL | Status: DC
Start: 2015-06-21 — End: 2015-11-30

## 2015-06-21 NOTE — Patient Instructions (Addendum)
Chest xray today at [place] test today.  Start Pantoprazole 40mg  twice daily.  Continue Promethazine as needed.  We will set up HIDA scan and evaluation with GI.

## 2015-06-21 NOTE — Telephone Encounter (Signed)
-----   Message from Jackolyn Confer, MD sent at 06/21/2015 10:34 AM EDT ----- Chest xray was normal.

## 2015-06-21 NOTE — Assessment & Plan Note (Signed)
Diffuse abdominal pain, most severe in RUQ. Reviewed recent US abdomen and CT abdomen which showed no acute findings to explain pain. Pain seems out of proportion to exam and CT findings. Discussed potential causes of pain. Will set up HIDA scan and start Pantoprazole. Will set up GI evaluation for possible endoscopy. Follow up in 2 weeks. Continue Promethazine prn

## 2015-06-21 NOTE — Telephone Encounter (Signed)
Patient aware chest xray was normal.

## 2015-06-21 NOTE — Assessment & Plan Note (Addendum)
Chronic issues with depression. Reviewed notes in chart, which show previous diagnosis of bipolar disorder and schizophrenia, as well as antisocial and borderline personality disorders. Complicated by drug abuse.  His affect today is angry. He is not interested in psychiatric follow up. We discussed starting medication to help with depression, but he prefers to hold off for now. Follow up in 2 weeks.

## 2015-06-21 NOTE — Progress Notes (Signed)
Pre visit review using our clinic review tool, if applicable. No additional management support is needed unless otherwise documented below in the visit note. 

## 2015-06-21 NOTE — Progress Notes (Signed)
Subjective:    Patient ID: Michael [name], male    DOB: 1990-12-16, 25 y.o.   MRN: [id number]  HPI  24YO male presents to establish care.  Abdominal pain - Recently seen in the ER with abdominal pain. CT scan showed mild thickening of the rectum without adjacent inflammatory changes. Labs including CMP, lipase and CBC were normal. Pain started about 3-4 months ago. Mostly located in right upper abdomen. Sometimes, severe cramping pain. No clear triggers, however worse with eating. Some NVD. No blood in stool. Weight is down 10lbs.  . Not taking anything except Pepcid, Phenergan and Bentyl, as prescribed in the ER with minimal improvement. Has history of diverticulitis.  Previously diagnosed with schizophrenia and bipolar disorder. Determined this was drug induced. Always had issues with depression and anxiety. Previous suicide attempt. Also diagnosed with ADHD in past. Went off psychiatric medications about 2 months ago. Felt like a "zombie" on meds. Followed by [employer], but quit seeing them. Not interested in restarting medications at this time. Living with parents.    Wt Readings from Last 3 Encounters:  06/21/15 199 lb 9.6 oz (90.538 kg)  06/14/15 200 lb (90.719 kg)  10/09/14 190 lb (86.183 kg)   BP Readings from Last 3 Encounters:  06/21/15 132/100  06/14/15 140/73  10/09/14 154/93    Past Medical History  Diagnosis Date  . GERD (gastroesophageal reflux disease)   . Allergy   . Asthma    Family History  Problem Relation Age of Onset  . Adopted: Yes   Past Surgical History  Procedure Laterality Date  . Tonsillectomy and adenoidectomy     Social History   Social History  . Marital Status: Single    Spouse Name: N/A  . Number of Children: N/A  . Years of Education: N/A   Social History Main Topics  . Smoking status: Current Every Day Smoker -- 1.00 packs/day    Types: Cigarettes  . Smokeless tobacco: Former Systems developer  . Alcohol Use: 0.0 oz/week   0 Standard drinks or equivalent per week     Comment: social  . Drug Use: Yes     Comment: marijuana  . Sexual Activity:    Partners: Female   Other Topics Concern  . None   Social History Narrative   Lives at home with parents.      Work - none at present, has worked Lawyer - Completed high school      Hobbies - computers, outdoors, fishing      Diet - regular      Exercise - none    Review of Systems  Constitutional: Negative for fever, chills, activity change, appetite change, fatigue and unexpected weight change.  HENT: Negative for congestion.   Eyes: Negative for visual disturbance.  Respiratory: Positive for cough. Negative for shortness of breath and wheezing.   Cardiovascular: Negative for chest pain, palpitations and leg swelling.  Gastrointestinal: Positive for nausea, vomiting, abdominal pain and abdominal distention. Negative for diarrhea and constipation.  Genitourinary: Negative for dysuria, urgency, frequency, decreased urine volume and difficulty urinating.  Musculoskeletal: Negative for arthralgias and gait problem.  Skin: Negative for color change and rash.  Neurological: Negative for weakness.  Hematological: Negative for adenopathy.  Psychiatric/Behavioral: Positive for behavioral problems, sleep disturbance, dysphoric mood and agitation. The patient is not nervous/anxious.        Objective:    BP 132/100 mmHg  Pulse 83  Ht 5\' 7"  (1.702  m)  Wt 199 lb 9.6 oz (90.538 kg)  BMI 31.25 kg/m2  SpO2 97% Physical Exam  Constitutional: He is oriented to person, place, and time. He appears well-developed and well-nourished. No distress.  HENT:  Head: Normocephalic and atraumatic.  Right Ear: External ear normal.  Left Ear: External ear normal.  Nose: Nose normal.  Mouth/Throat: Oropharynx is clear and moist. No oropharyngeal exudate.  Eyes: Conjunctivae and EOM are normal. Pupils are equal, round, and reactive to light. Right  eye exhibits no discharge. Left eye exhibits no discharge. No scleral icterus.  Neck: Normal range of motion. Neck supple. No tracheal deviation present. No thyromegaly present.  Cardiovascular: Normal rate, regular rhythm and normal heart sounds.  Exam reveals no gallop and no friction rub.   No murmur heard. Pulmonary/Chest: Effort normal and breath sounds normal. No accessory muscle usage. No tachypnea. No respiratory distress. He has no decreased breath sounds. He has no wheezes. He has no rhonchi. He has no rales. He exhibits no tenderness.  Abdominal: Soft. Bowel sounds are normal. He exhibits no distension and no mass. There is tenderness (extremely tender to even light touch diffusely over abdomen). There is guarding. There is no rebound.  Musculoskeletal: Normal range of motion. He exhibits no edema.  Lymphadenopathy:    He has no cervical adenopathy.  Neurological: He is alert and oriented to person, place, and time. No cranial nerve deficit. Coordination normal.  Skin: Skin is warm and dry. No rash noted. He is not diaphoretic. No erythema. No pallor.  Psychiatric: His speech is normal. Judgment and thought content normal. His mood appears anxious. His affect is angry and blunt. He is agitated. Cognition and memory are normal. He exhibits a depressed mood.          Assessment & Plan:   Problem List Items Addressed This Visit      Unprioritized   Abdominal pain - Primary    Diffuse abdominal pain, most severe in RUQ. Reviewed recent US abdomen and CT abdomen which showed no acute findings to explain pain. Pain seems out of proportion to exam and CT findings. Discussed potential causes of pain. Will set up HIDA scan and start Pantoprazole. Will set up GI evaluation for possible endoscopy. Follow up in 2 weeks. Continue Promethazine prn      Relevant Orders   Ambulatory referral to Gastroenterology   NM Hepato W/Eject Fract   H. pylori breath test   DG Chest 2 View   Depressed     Chronic issues with depression. Reviewed notes in chart, which show previous diagnosis of bipolar disorder and schizophrenia, as well as antisocial and borderline personality disorders. Complicated by drug abuse.  His affect today is angry. He is not interested in psychiatric follow up. We discussed starting medication to help with depression, but he prefers to hold off for now. Follow up in 2 weeks.          Return in about 2 weeks (around 07/05/2015) for Recheck.  Ronette Deter, MD Internal Medicine [facility]

## 2015-06-22 ENCOUNTER — Telehealth: Payer: Self-pay | Admitting: Internal Medicine

## 2015-06-22 LAB — H. PYLORI BREATH TEST: H. PYLORI BREATH TEST: NOT DETECTED

## 2015-06-22 NOTE — Telephone Encounter (Signed)
Requests pt be seen sooner for right sided abd pain and nausea. Pt scheduled to see Nicoletta Ba PA 06/29/15@1 :30pm. Pts mother aware of appt.

## 2015-06-23 ENCOUNTER — Encounter: Payer: Self-pay | Admitting: Internal Medicine

## 2015-06-24 ENCOUNTER — Ambulatory Visit
Admission: RE | Admit: 2015-06-24 | Discharge: 2015-06-24 | Disposition: A | Discharge status: Home / Self Care | Payer: Medicare Other | Source: Ambulatory Visit | Attending: Internal Medicine | Admitting: Internal Medicine

## 2015-06-24 ENCOUNTER — Other Ambulatory Visit: Payer: Self-pay | Admitting: Internal Medicine

## 2015-06-24 ENCOUNTER — Encounter: Payer: Self-pay | Admitting: Internal Medicine

## 2015-06-24 DIAGNOSIS — R1084 Generalized abdominal pain: Secondary | ICD-10-CM | POA: Insufficient documentation

## 2015-06-24 DIAGNOSIS — R1011 Right upper quadrant pain: Secondary | ICD-10-CM | POA: Diagnosis not present

## 2015-06-24 DIAGNOSIS — R197 Diarrhea, unspecified: Secondary | ICD-10-CM

## 2015-06-24 MED ORDER — SINCALIDE 5 MCG IJ SOLR
0.0200 ug/kg | Freq: Once | INTRAMUSCULAR | Status: AC
  Administered 2015-06-24: 1.8 ug via INTRAVENOUS

## 2015-06-24 MED ORDER — TECHNETIUM TC 99M MEBROFENIN IV KIT
5.0000 | PACK | Freq: Once | INTRAVENOUS | Status: AC | PRN
  Administered 2015-06-24: 5.24 via INTRAVENOUS

## 2015-06-29 ENCOUNTER — Encounter: Payer: Self-pay | Admitting: Physician Assistant

## 2015-06-29 ENCOUNTER — Other Ambulatory Visit (INDEPENDENT_AMBULATORY_CARE_PROVIDER_SITE_OTHER): Payer: Medicare Other

## 2015-06-29 ENCOUNTER — Ambulatory Visit (INDEPENDENT_AMBULATORY_CARE_PROVIDER_SITE_OTHER): Payer: Medicare Other | Admitting: Physician Assistant

## 2015-06-29 VITALS — BP 148/68 | HR 90 | Ht 67.0 in | Wt 202.0 lb

## 2015-06-29 DIAGNOSIS — R1011 Right upper quadrant pain: Secondary | ICD-10-CM

## 2015-06-29 DIAGNOSIS — R634 Abnormal weight loss: Secondary | ICD-10-CM | POA: Diagnosis not present

## 2015-06-29 DIAGNOSIS — K219 Gastro-esophageal reflux disease without esophagitis: Secondary | ICD-10-CM | POA: Diagnosis not present

## 2015-06-29 DIAGNOSIS — R197 Diarrhea, unspecified: Secondary | ICD-10-CM

## 2015-06-29 LAB — IGA: IgA: 216 mg/dL (ref 68–378)

## 2015-06-29 LAB — HIGH SENSITIVITY CRP: CRP HIGH SENSITIVITY: 1.18 mg/L (ref 0.000–5.000)

## 2015-06-29 LAB — SEDIMENTATION RATE: SED RATE: 9 mm/h (ref 0–22)

## 2015-06-29 MED ORDER — NA SULFATE-K SULFATE-MG SULF 17.5-3.13-1.6 GM/177ML PO SOLN: 1.0000 | Freq: Once | ORAL | Status: DC

## 2015-06-29 NOTE — Progress Notes (Signed)
Patient ID: Michael [name], male   DOB: [birth year], 25 y.o.   MRN: [id number]   Subjective:    Patient ID: Michael [name], male    DOB: 1990/04/19, 25 y.o.   MRN: [id number]  HPI  Michael is a 25 year old white male new to GI today being referred by Dr. Ronette Deter for evaluation of abdominal pain nausea and diarrhea. Patient has history of substance abuse, bipolar disorder depression, and schizophrenia was mentioned in one of the notes reviewed. He has been undergoing workup for complaints of abdominal pain and nausea which she says is been present for several months has been progressive and is now constant. He had CT of the abdomen and pelvis done on 06/14/2015 which was negative with the exception of minimal thickening of the rectum there were no adjacent inflammatory changes in questioned mild proctitis. Upper abdominal ultrasound is negative CCK HIDA scan on 06/24/2015 showed an EF of 76% he did have pain with the CCK infusion. Recent labs with CBC, lipase see met and H. pylori all negative He has been on Protonix and using Phenergan for nausea, says both of these help some. He had been on a trial of Bentyl but did not feel it helped and stop taking it. Again he says she's been having ongoing pain for the past few months progressive to the point of being constant and points to his upper abdomen and right upper quadrant specifically. This is worse with eating. He has had some intermittent vomiting no fever or chills, and his weight is down about 10 pounds. He has not noted any melena or hematochezia but has been having ongoing problems with diarrhea at least 2-3 times today per day. He says his stools are very light in color. He denies EtOH abuse today, no regular aspirin or NSAIDs.  Review of Systems Pertinent positive and negative review of systems were noted in the above HPI section.  All other review of systems was otherwise negative.  Outpatient Encounter Prescriptions as of  06/29/2015  Medication Sig  . Na Sulfate-K Sulfate-Mg Sulf SOLN Take 1 kit by mouth once.  . pantoprazole (PROTONIX) 40 MG tablet Take 1 tablet (40 mg total) by mouth 2 (two) times daily.  . promethazine (PHENERGAN) 25 MG tablet Take 1 tablet (25 mg total) by mouth every 6 (six) hours as needed for nausea or vomiting.  . [DISCONTINUED] dicyclomine (BENTYL) 20 MG tablet Take 1 tablet (20 mg total) by mouth 3 (three) times daily as needed for spasms.   No facility-administered encounter medications on file as of 06/29/2015.   Allergies  Allergen Reactions  . Bee Venom Anaphylaxis  . Quetiapine Rash  . Serotonin Reuptake Inhibitors (Ssris) Rash  . Gabapentin Nausea Only and Nausea And Vomiting  . Lurasidone     Other reaction(s): Vomiting  . Prednisone Other (See Comments)    Difficulty breathing  . Anette Guarneri [Lurasidone Hcl] Rash and Nausea And Vomiting   Patient Active Problem List   Diagnosis Date Noted  . Abdominal pain 06/21/2015  . Depressed 06/21/2015  . Substance induced mood disorder ([place] ) 10/09/2014  . Alcohol abuse 10/09/2014  . Marijuana abuse 10/09/2014  . Bipolar disorder ([place]) 10/09/2014   Social History   Social History  . Marital Status: Single    Spouse Name: N/A  . Number of Children: 2  . Years of Education: N/A   Occupational History  . Not on file.   Social History Main Topics  . Smoking status: Current Every Day  Smoker -- 1.00 packs/day    Types: Cigarettes  . Smokeless tobacco: Former Systems developer  . Alcohol Use: No  . Drug Use: Yes     Comment: marijuana  . Sexual Activity:    Partners: Female   Other Topics Concern  . Not on file   Social History Narrative   Lives at home with parents.      Work - none at present, has worked Lawyer - Completed high school      Hobbies - computers, outdoors, fishing      Diet - regular      Exercise - none    Mr. Hartlage family history includes Cancer in his paternal grandfather;  Diabetes in his paternal grandfather; Ulcers in his father. He was adopted.      Objective:    Filed Vitals:   06/29/15 1335  BP: 148/68  Pulse: 90    Physical Exam  well-developed young overweight white male in no acute distress blood pressure 148/60 pulse 90 height 5 foot 7 weight 202. HEENT; nontraumatic normocephalic EOMI PERRLA sclera anicteric, Cardiovascular; regular rate and rhythm with S1-S2 no murmur or gallop, Pulmonary; clear bilaterally, Abdomen ;obese soft he is tender in the right upper quadrant and exquisitely tender with very light palpation of the abdominal wall and the costal margin on the right as well as on the left though right greater, he also is tender with pressure to the lower sternum, No palpable mass or hepatosplenomegaly bowel sounds are present, Rectal ;exam not done, Ext; is no clubbing cyanosis or edema skin warm and dry, Neuropsych; mood and affect appropriate     Assessment & Plan:   #1 25 yo male With 2-3 month history of constant epigastric and right upper quadrant pain, fairly extensive workup thus far unremarkable. On exam he clearly has a component of musculoskeletal pain and suspect he does have costochondritis at exquisite tenderness over the costal margin and lower anterior ribs on the right and less so on the left. Cannot rule out peptic ulcer disease or Gastropathy in addition #2 persistent diarrhea #3 question mild proctitis on recent CT Will rule out underlying IBD #4 history of substance abuse including EtOH #5 depression/bipolar disorder  Plan; continue Protonix 40 mg by mouth twice a day short-term Add short trial of Aleve 400 mg by mouth twice a day 2 weeks and trial of local heat. Patient says is not working currently so not doing any heavy lifting etc. Schedule for EGD and colonoscopy with Dr. Ardis Hughs. Procedure discussed in detail with patient and he is agreeable to proceed Check sedimentation rate and CRP.   Amy S Esterwood  PA-C 06/29/2015   Cc: Jackolyn Confer, MD

## 2015-06-29 NOTE — Patient Instructions (Addendum)
Please go to the basement level to have your labs drawn.  Continue the Protonix 40 mg, Take 1 twice daily. Take Aleve 1-2 twice daily for 2-3 weeks.  Try a heating pad on the abdomen.   You have been scheduled for an endoscopy and colonoscopy. Please follow the written instructions given to you at your visit today. Please pick up your prep supplies at the pharmacy within the next 1-3 days. If you use inhalers (even only as needed), please bring them with you on the day of your procedure. Your physician has requested that you go to www.startemmi.com and enter the access code given to you at your visit today. This web site gives a general overview about your procedure. However, you should still follow specific instructions given to you by our office regarding your preparation for the procedure.            If you are age 76 or younger, your body mass index should be between 19-25. Your Body mass index is 31.63 kg/(m^2). If this is out of the aformentioned range listed, please consider follow up with your Primary Care Provider.

## 2015-06-30 LAB — TISSUE TRANSGLUTAMINASE, IGG: Tissue Transglut Ab: 1 U/mL (ref <6)

## 2015-06-30 NOTE — Progress Notes (Signed)
i agree with the above note, paln

## 2015-07-09 ENCOUNTER — Ambulatory Visit (INDEPENDENT_AMBULATORY_CARE_PROVIDER_SITE_OTHER): Payer: Medicare Other | Admitting: Internal Medicine

## 2015-07-09 ENCOUNTER — Encounter: Payer: Self-pay | Admitting: Internal Medicine

## 2015-07-09 VITALS — BP 130/80 | HR 91 | Ht 67.0 in | Wt 203.1 lb

## 2015-07-09 DIAGNOSIS — R1084 Generalized abdominal pain: Secondary | ICD-10-CM | POA: Diagnosis not present

## 2015-07-09 DIAGNOSIS — Z91018 Allergy to other foods: Secondary | ICD-10-CM

## 2015-07-09 MED ORDER — ONDANSETRON HCL 8 MG PO TABS: 8.0000 mg | ORAL_TABLET | Freq: Three times a day (TID) | ORAL | Status: DC | PRN

## 2015-07-09 MED ORDER — PROMETHAZINE HCL 25 MG PO TABS: 25.0000 mg | ORAL_TABLET | Freq: Four times a day (QID) | ORAL | Status: DC | PRN

## 2015-07-09 NOTE — Patient Instructions (Signed)
Labs today to check for meat allergy.  We will set up referral to allergist.  Follow up after endoscopy.

## 2015-07-09 NOTE — Assessment & Plan Note (Signed)
Abdominal pain and nausea are persistent. CT was normal except for possible proctitis. HIDA was normal. Reviewed notes from GI evaluation. No improvement with PPI and NSAIDS. Given recent nausea and vomiting with eating meat, question meat allergy. Will send allergy testing today and set up referral to allergy/immunology.

## 2015-07-09 NOTE — Progress Notes (Signed)
Subjective:    Patient ID: Michael [name], male    DOB: Feb 13, 1991, 25 y.o.   MRN: [id number]  HPI  25YO male presents for follow up.   Recently seen for abdominal pain. Labs were normal. CT showed no acute processes. HIDA was normal. Referral was placed to GI. GI recommended starting Aleve and trial of local heat. He has been scheduled for EGD and colonoscopy with Dr. Ardis Hughs.  Taking Aleve with no improvement. Feeling more nauseous. Meat seems to trigger nausea. Taking phenergan but having drowsiness with this. Occasional vomiting of food particles.  Wt Readings from Last 3 Encounters:  07/09/15 203 lb 1.9 oz (92.135 kg)  06/29/15 202 lb (91.627 kg)  06/24/15 199 lb (90.266 kg)   BP Readings from Last 3 Encounters:  07/09/15 130/80  06/29/15 148/68  06/21/15 132/100    Past Medical History  Diagnosis Date  . GERD (gastroesophageal reflux disease)   . Allergy   . Asthma   . Pancreatitis    Family History  Problem Relation Age of Onset  . Adopted: Yes  . Ulcers Father   . Cancer Paternal Grandfather     lung  . Diabetes Paternal Grandfather    Past Surgical History  Procedure Laterality Date  . Tonsillectomy and adenoidectomy     Social History   Social History  . Marital Status: Single    Spouse Name: N/A  . Number of Children: 2  . Years of Education: N/A   Social History Main Topics  . Smoking status: Current Every Day Smoker -- 1.00 packs/day    Types: Cigarettes  . Smokeless tobacco: Former Systems developer  . Alcohol Use: No  . Drug Use: Yes     Comment: marijuana  . Sexual Activity:    Partners: Female   Other Topics Concern  . None   Social History Narrative   Lives at home with parents.      Work - none at present, has worked Lawyer - Completed high school      Hobbies - computers, outdoors, fishing      Diet - regular      Exercise - none    Review of Systems  Constitutional: Negative for fever, chills,  activity change, appetite change, fatigue and unexpected weight change.  Eyes: Negative for visual disturbance.  Respiratory: Negative for cough and shortness of breath.   Cardiovascular: Negative for chest pain, palpitations and leg swelling.  Gastrointestinal: Positive for nausea, vomiting and abdominal pain. Negative for diarrhea, constipation and abdominal distention.  Genitourinary: Negative for dysuria, urgency and difficulty urinating.  Musculoskeletal: Negative for arthralgias and gait problem.  Skin: Negative for color change and rash.  Hematological: Negative for adenopathy.  Psychiatric/Behavioral: Positive for sleep disturbance. Negative for dysphoric mood. The patient is not nervous/anxious.        Objective:    BP 130/80 mmHg  Pulse 91  Ht 5\' 7"  (1.702 m)  Wt 203 lb 1.9 oz (92.135 kg)  BMI 31.81 kg/m2  SpO2 98% Physical Exam  Constitutional: He is oriented to person, place, and time. He appears well-developed and well-nourished. No distress.  HENT:  Head: Normocephalic and atraumatic.  Right Ear: External ear normal.  Left Ear: External ear normal.  Nose: Nose normal.  Mouth/Throat: Oropharynx is clear and moist. No oropharyngeal exudate.  Eyes: Conjunctivae and EOM are normal. Pupils are equal, round, and reactive to light. Right eye exhibits no discharge. Left eye exhibits no  discharge. No scleral icterus.  Neck: Normal range of motion. Neck supple. No tracheal deviation present. No thyromegaly present.  Cardiovascular: Normal rate, regular rhythm and normal heart sounds.  Exam reveals no gallop and no friction rub.   No murmur heard. Pulmonary/Chest: Effort normal and breath sounds normal. No accessory muscle usage. No tachypnea. No respiratory distress. He has no decreased breath sounds. He has no wheezes. He has no rhonchi. He has no rales. He exhibits no tenderness.  Abdominal: Soft. Bowel sounds are normal. He exhibits no distension and no mass. There is  tenderness (diffuse tenderness with even very light palpation of skin over abdomen). There is no rebound and no guarding.  Musculoskeletal: Normal range of motion. He exhibits no edema.  Lymphadenopathy:    He has no cervical adenopathy.  Neurological: He is alert and oriented to person, place, and time. No cranial nerve deficit. Coordination normal.  Skin: Skin is warm and dry. No rash noted. He is not diaphoretic. No erythema. No pallor.  Psychiatric: He has a normal mood and affect. His behavior is normal. Judgment and thought content normal.          Assessment & Plan:   Problem List Items Addressed This Visit      Unprioritized   Abdominal pain - Primary    Abdominal pain and nausea are persistent. CT was normal except for possible proctitis. HIDA was normal. Reviewed notes from GI evaluation. No improvement with PPI and NSAIDS. Given recent nausea and vomiting with eating meat, question meat allergy. Will send allergy testing today and set up referral to allergy/immunology.      Relevant Medications   promethazine (PHENERGAN) 25 MG tablet   ondansetron (ZOFRAN) 8 MG tablet    Other Visit Diagnoses    Allergy to meat        Relevant Orders    Allergen Profile, Food-Meat    Ambulatory referral to Allergy        Return in about 4 weeks (around 08/06/2015) for Recheck.  Ronette Deter, MD Internal Medicine [facility]

## 2015-07-09 NOTE — Progress Notes (Signed)
Pre visit review using our clinic review tool, if applicable. No additional management support is needed unless otherwise documented below in the visit note. 

## 2015-07-12 ENCOUNTER — Encounter: Payer: Self-pay | Admitting: Internal Medicine

## 2015-07-14 LAB — ALLERGEN PROFILE, FOOD-MEAT: Beef IgE: 0.1 kU/L — AB

## 2015-07-23 ENCOUNTER — Encounter: Payer: Self-pay | Admitting: Gastroenterology

## 2015-07-23 ENCOUNTER — Ambulatory Visit (AMBULATORY_SURGERY_CENTER): Payer: Medicare Other | Admitting: Gastroenterology

## 2015-07-23 VITALS — BP 115/82 | HR 82 | Temp 99.8°F | Resp 10 | Ht 67.0 in | Wt 202.0 lb

## 2015-07-23 DIAGNOSIS — R197 Diarrhea, unspecified: Secondary | ICD-10-CM

## 2015-07-23 DIAGNOSIS — D123 Benign neoplasm of transverse colon: Secondary | ICD-10-CM

## 2015-07-23 DIAGNOSIS — D120 Benign neoplasm of cecum: Secondary | ICD-10-CM | POA: Diagnosis not present

## 2015-07-23 DIAGNOSIS — K529 Noninfective gastroenteritis and colitis, unspecified: Secondary | ICD-10-CM | POA: Diagnosis not present

## 2015-07-23 DIAGNOSIS — K219 Gastro-esophageal reflux disease without esophagitis: Secondary | ICD-10-CM | POA: Diagnosis not present

## 2015-07-23 DIAGNOSIS — R1011 Right upper quadrant pain: Secondary | ICD-10-CM

## 2015-07-23 DIAGNOSIS — R109 Unspecified abdominal pain: Secondary | ICD-10-CM | POA: Diagnosis not present

## 2015-07-23 MED ORDER — SODIUM CHLORIDE 0.9 % IV SOLN: 500.0000 mL | INTRAVENOUS | Status: DC

## 2015-07-23 NOTE — Op Note (Signed)
[facility] Patient Name: Michael [name] Procedure Date: 07/23/2015 2:45 PM MRN: [id number] Endoscopist: Milus Banister , MD Age: 25 Referring MD:  Date of Birth: [birth year] Gender: Male Procedure:                Upper GI endoscopy Indications:              Abdominal pain in the right upper quadrant Medicines:                Monitored Anesthesia Care Procedure:                Pre-Anesthesia Assessment:                           - Prior to the procedure, a History and Physical                            was performed, and patient medications and                            allergies were reviewed. The patient's tolerance of                            previous anesthesia was also reviewed. The risks                            and benefits of the procedure and the sedation                            options and risks were discussed with the patient.                            All questions were answered, and informed consent                            was obtained. Prior Anticoagulants: The patient has                            taken no previous anticoagulant or antiplatelet                            agents. ASA Grade Assessment: II - A patient with                            mild systemic disease. After reviewing the risks                            and benefits, the patient was deemed in                            satisfactory condition to undergo the procedure.                           After obtaining informed consent, the endoscope was  passed under direct vision. Throughout the                            procedure, the patient's blood pressure, pulse, and                            oxygen saturations were monitored continuously. The                            Model GIF-HQ190 [id number] scope was introduced                            through the mouth, and advanced to the second part                            of duodenum. The upper GI  endoscopy was                            accomplished without difficulty. The patient                            tolerated the procedure well. Scope In: Scope Out: Findings:                 A 4 cm hiatal hernia was present.                           The exam was otherwise without abnormality. Complications:            No immediate complications. Estimated blood loss:                            None. Estimated Blood Loss:     Estimated blood loss: none. Impression:               - 4 cm hiatal hernia.                           - The examination was otherwise normal.                           - No specimens collected. Recommendation:           - Patient has a contact number available for                            emergencies. The signs and symptoms of potential                            delayed complications were discussed with the                            patient. Return to normal activities tomorrow.                            Written discharge instructions were provided to the  patient.                           - Resume previous diet.                           - Continue present medications.                           - Await path results from colonoscopy. If the                            pathology does not explain your symptoms, then you                            will likely be referred to general surgery to                            consider cholecystectomy for RUQ pains, abnormal                            HIDA scan (pain with CCK injection). Milus Banister, MD 07/23/2015 3:21:01 PM This report has been signed electronically.

## 2015-07-23 NOTE — Patient Instructions (Addendum)
One of your biggest health concerns is your smoking.  This increases your risk for most cancers and serious cardiovascular diseases such as strokes, heart attacks.  You should try your best to stop.  If you need assistance, please contact your PCP or Smoking Cessation Class at [facility] [phone]) or [facility] (1-800-QUIT-NOW).   YOU HAD AN ENDOSCOPIC PROCEDURE TODAY AT [facility]:   Refer to the procedure report that was given to you for any specific questions about what was found during the examination.  If the procedure report does not answer your questions, please call your gastroenterologist to clarify.  If you requested that your care partner not be given the details of your procedure findings, then the procedure report has been included in a sealed envelope for you to review at your convenience later.  YOU SHOULD EXPECT: Some feelings of bloating in the abdomen. Passage of more gas than usual.  Walking can help get rid of the air that was put into your GI tract during the procedure and reduce the bloating. If you had a lower endoscopy (such as a colonoscopy or flexible sigmoidoscopy) you may notice spotting of blood in your stool or on the toilet paper. If you underwent a bowel prep for your procedure, you may not have a normal bowel movement for a few days.  Please Note:  You might notice some irritation and congestion in your nose or some drainage.  This is from the oxygen used during your procedure.  There is no need for concern and it should clear up in a day or so.  SYMPTOMS TO REPORT IMMEDIATELY:   Following lower endoscopy (colonoscopy or flexible sigmoidoscopy):  Excessive amounts of blood in the stool  Significant tenderness or worsening of abdominal pains  Swelling of the abdomen that is new, acute  Fever of 100F or higher   Following upper endoscopy (EGD)  Vomiting of blood or coffee ground material  New chest pain or pain under the  shoulder blades  Painful or persistently difficult swallowing  New shortness of breath  Fever of 100F or higher  Black, tarry-looking stools  For urgent or emergent issues, a gastroenterologist can be reached at any hour by calling [phone].   DIET: Your first meal following the procedure should be a small meal and then it is ok to progress to your normal diet. Heavy or fried foods are harder to digest and may make you feel nauseous or bloated.  Likewise, meals heavy in dairy and vegetables can increase bloating.  Drink plenty of fluids but you should avoid alcoholic beverages for 24 hours.  ACTIVITY:  You should plan to take it easy for the rest of today and you should NOT DRIVE or use heavy machinery until tomorrow (because of the sedation medicines used during the test).    FOLLOW UP: Our staff will call the number listed on your records the next business day following your procedure to check on you and address any questions or concerns that you may have regarding the information given to you following your procedure. If we do not reach you, we will leave a message.  However, if you are feeling well and you are not experiencing any problems, there is no need to return our call.  We will assume that you have returned to your regular daily activities without incident.  If any biopsies were taken you will be contacted by phone or by letter within the next 1-3 weeks.  Please  call us at [phone] if you have not heard about the biopsies in 3 weeks.    SIGNATURES/CONFIDENTIALITY: You and/or your care partner have signed paperwork which will be entered into your electronic medical record.  These signatures attest to the fact that that the information above on your After Visit Summary has been reviewed and is understood.  Full responsibility of the confidentiality of this discharge information lies with you and/or your care-partner.  You will likely be referred to a general surgeon for  gallbladder removal.  Thank-you for choosing [place] for your healthcare needs today.

## 2015-07-23 NOTE — Progress Notes (Signed)
Report to PACU, RN, vss, BBS= Clear.  

## 2015-07-23 NOTE — Op Note (Signed)
Michael [name]: Michael [name] Procedure Date: 07/23/2015 2:46 PM MRN: [id number] Endoscopist: Milus Banister , MD Age: 25 Referring MD:  Date of Birth: [birth year] Gender: Male Procedure:                Colonoscopy Indications:              Abdominal pain in the right upper quadrant,                            abnormal colon on CT (rectum) Medicines:                Monitored Anesthesia Care Procedure:                Pre-Anesthesia Assessment:                           - Prior to the procedure, a History and Physical                            was performed, and patient medications and                            allergies were reviewed. The patient's tolerance of                            previous anesthesia was also reviewed. The risks                            and benefits of the procedure and the sedation                            options and risks were discussed with the patient.                            All questions were answered, and informed consent                            was obtained. Prior Anticoagulants: The patient has                            taken no previous anticoagulant or antiplatelet                            agents. ASA Grade Assessment: II - A patient with                            mild systemic disease. After reviewing the risks                            and benefits, the patient was deemed in                            satisfactory condition to undergo the procedure.  After obtaining informed consent, the colonoscope                            was passed under direct vision. Throughout the                            procedure, the patient's blood pressure, pulse, and                            oxygen saturations were monitored continuously. The                            Model CF-HQ190L [id number]) scope was introduced                            through the anus and advanced to the the terminal                     ileum. The colonoscopy was performed without                            difficulty. The patient tolerated the procedure                            well. The quality of the bowel preparation was                            excellent. The terminal ileum, ileocecal valve,                            appendiceal orifice, and rectum were photographed. Scope In: 2:57:09 PM Scope Out: 3:08:29 PM Scope Withdrawal Time: 0 hours 9 minutes 12 seconds  Total Procedure Duration: 0 hours 11 minutes 20 seconds  Findings:                 The terminal ileum contained many diffuse                            semi-pedunculated, small, non-bleeding polyps. The                            polyps were 2 mm in diameter. Biopsies were taken                            with a cold forceps for histology.                           Two sessile polyps were found in the transverse                            colon and cecum. The polyps were 3 to 6 mm in size.                            These polyps were removed with a cold snare.  Resection and retrieval were complete.                           The exam was otherwise without abnormality on                            direct and retroflexion views. Complications:            No immediate complications. Estimated blood loss:                            None. Estimated Blood Loss:     Estimated blood loss: none. Impression:               - Many ileal polyps (vs nodules) in the terminal                            ileum. Biopsied.                           - Two 3 to 6 mm polyps in the transverse colon and                            in the cecum, removed with a cold snare. Resected                            and retrieved.                           - The examination was otherwise normal on direct                            and retroflexion views. Recommendation:           - Patient has a contact number available for                             emergencies. The signs and symptoms of potential                            delayed complications were discussed with the                            patient. Return to normal activities tomorrow.                            Written discharge instructions were provided to the                            patient.                           - Resume previous diet.                           - Continue present medications.                           -  Repeat colonoscopy is recommended. The                            colonoscopy date will be determined after pathology                            results from today's exam become available for                            review.                           - Will proceed with EGD now. Milus Banister, MD 07/23/2015 3:17:59 PM This report has been signed electronically.

## 2015-07-23 NOTE — Progress Notes (Signed)
Patient stated that he does use marijuana and has used heroin in the past.  He is not taking any meds for his bipolar diagnosis. Mom states that she needs counseling. I suggested a Psychiatrist in the area. She agreed, and he did too.  He stated that he has "never been happy."

## 2015-07-23 NOTE — Progress Notes (Signed)
Called to room to assist during endoscopic procedure.  Patient ID and intended procedure confirmed with present staff. Received instructions for my participation in the procedure from the performing physician.  

## 2015-07-26 ENCOUNTER — Telehealth: Payer: Self-pay

## 2015-07-26 NOTE — Telephone Encounter (Signed)
  Follow up Call-  Call back number 07/23/2015  Post procedure Call Back phone  # [phone],MAY TALK TO MOTHER  Permission to leave phone message Yes    Patient was called for follow up after his procedure on 07/23/2015. I spoke with Delaine's mother and she reports that he did not have any difficulty related to the procedures, however he continues to complain about RUQ pain. I reiterated to his mother that Dr. Ardis Hughs is waiting for the biopsy results and if that doesn't explain the pain, then he will proceed with a surgical consult to consider Cholecystectomy. Patient will call the office to get the results.

## 2015-07-27 ENCOUNTER — Telehealth: Payer: Self-pay | Admitting: Gastroenterology

## 2015-07-27 NOTE — Telephone Encounter (Signed)
1:45 pm  07/30/15 Dr Lucia Gaskins 1:15 pm arrival [facility] pt mother has been notified

## 2015-07-27 NOTE — Telephone Encounter (Signed)
The pt's mother is calling to get the biopsy results and states that her son is very depressed and in a lot of pain and she is concerned advised her that per the procedure note Dr Ardis Hughs would possibly order a surgical consult to discuss gallbladder removal.  The pt's mother states that her son is worried he is going to die.  I told her if he is in severe pain he needs to go to the ED for evaluation.  She stated she understood and would talk to the pt and wait for a call back with Dr Ardis Hughs recommendations.  Dr Ardis Hughs have you reviewed the path?

## 2015-07-27 NOTE — Telephone Encounter (Signed)
Please call him.  The biopsies from his terminal ileum showed lymphoid aggregates which are normal.  The polyps were small but potentially pre-cancerous and so he will need recall colonoscopy in 5 years.  These biopsies do not explain his pains. The only positive during the workup was + symptoms with CCK during HIDA, I want to refer him to [facility] to consider cholecystectomy.  Santiago Glad, no result note is needed. (5 year colon recall, see above)

## 2015-07-28 DIAGNOSIS — F319 Bipolar disorder, unspecified: Secondary | ICD-10-CM | POA: Insufficient documentation

## 2015-07-28 DIAGNOSIS — J45909 Unspecified asthma, uncomplicated: Secondary | ICD-10-CM | POA: Diagnosis not present

## 2015-07-28 DIAGNOSIS — F1290 Cannabis use, unspecified, uncomplicated: Secondary | ICD-10-CM | POA: Insufficient documentation

## 2015-07-28 DIAGNOSIS — R1011 Right upper quadrant pain: Secondary | ICD-10-CM | POA: Insufficient documentation

## 2015-07-28 DIAGNOSIS — F329 Major depressive disorder, single episode, unspecified: Secondary | ICD-10-CM | POA: Insufficient documentation

## 2015-07-28 DIAGNOSIS — F17210 Nicotine dependence, cigarettes, uncomplicated: Secondary | ICD-10-CM | POA: Insufficient documentation

## 2015-07-28 DIAGNOSIS — R1084 Generalized abdominal pain: Secondary | ICD-10-CM | POA: Diagnosis not present

## 2015-07-29 ENCOUNTER — Encounter: Payer: Self-pay | Admitting: *Deleted

## 2015-07-29 ENCOUNTER — Emergency Department: Payer: Medicare Other

## 2015-07-29 ENCOUNTER — Emergency Department
Admission: EM | Admit: 2015-07-29 | Discharge: 2015-07-29 | Disposition: A | Discharge status: Home / Self Care | Payer: Medicare Other | Attending: Emergency Medicine | Admitting: Emergency Medicine

## 2015-07-29 DIAGNOSIS — R1084 Generalized abdominal pain: Secondary | ICD-10-CM | POA: Diagnosis not present

## 2015-07-29 DIAGNOSIS — R1011 Right upper quadrant pain: Secondary | ICD-10-CM

## 2015-07-29 LAB — URINALYSIS COMPLETE WITH MICROSCOPIC (ARMC ONLY)
BILIRUBIN URINE: NEGATIVE
Bacteria, UA: NONE SEEN
GLUCOSE, UA: NEGATIVE mg/dL
HGB URINE DIPSTICK: NEGATIVE
Ketones, ur: NEGATIVE mg/dL
LEUKOCYTES UA: NEGATIVE
Nitrite: NEGATIVE
Protein, ur: NEGATIVE mg/dL
RBC / HPF: NONE SEEN RBC/hpf (ref 0–5)
SPECIFIC GRAVITY, URINE: 1.015 (ref 1.005–1.030)
SQUAMOUS EPITHELIAL / LPF: NONE SEEN
pH: 6 (ref 5.0–8.0)

## 2015-07-29 LAB — COMPREHENSIVE METABOLIC PANEL
ALBUMIN: 4.5 g/dL (ref 3.5–5.0)
ALT: 33 U/L (ref 17–63)
AST: 21 U/L (ref 15–41)
Alkaline Phosphatase: 69 U/L (ref 38–126)
Anion gap: 8 (ref 5–15)
BILIRUBIN TOTAL: 0.4 mg/dL (ref 0.3–1.2)
BUN: 17 mg/dL (ref 6–20)
CALCIUM: 9.2 mg/dL (ref 8.9–10.3)
CO2: 23 mmol/L (ref 22–32)
CREATININE: 0.86 mg/dL (ref 0.61–1.24)
Chloride: 105 mmol/L (ref 101–111)
Glucose, Bld: 94 mg/dL (ref 65–99)
Potassium: 4 mmol/L (ref 3.5–5.1)
Sodium: 136 mmol/L (ref 135–145)
Total Protein: 7 g/dL (ref 6.5–8.1)

## 2015-07-29 LAB — CBC
HCT: 43.6 % (ref 40.0–52.0)
Hemoglobin: 15 g/dL (ref 13.0–18.0)
MCH: 31.3 pg (ref 26.0–34.0)
MCHC: 34.3 g/dL (ref 32.0–36.0)
MCV: 91.3 fL (ref 80.0–100.0)
PLATELETS: 199 10*3/uL (ref 150–440)
RBC: 4.78 MIL/uL (ref 4.40–5.90)
RDW: 13.4 % (ref 11.5–14.5)
WBC: 13.2 10*3/uL — AB (ref 3.8–10.6)

## 2015-07-29 LAB — LIPASE, BLOOD: Lipase: 17 U/L (ref 11–51)

## 2015-07-29 MED ORDER — MORPHINE SULFATE (PF) 4 MG/ML IV SOLN
INTRAVENOUS | Status: AC
  Filled 2015-07-29: qty 1

## 2015-07-29 MED ORDER — ONDANSETRON HCL 4 MG/2ML IJ SOLN
4.0000 mg | Freq: Once | INTRAMUSCULAR | Status: AC
  Administered 2015-07-29: 4 mg via INTRAVENOUS

## 2015-07-29 MED ORDER — SODIUM CHLORIDE 0.9 % IV BOLUS (SEPSIS)
1000.0000 mL | Freq: Once | INTRAVENOUS | Status: AC
  Administered 2015-07-29: 1000 mL via INTRAVENOUS

## 2015-07-29 MED ORDER — ONDANSETRON HCL 4 MG/2ML IJ SOLN
INTRAMUSCULAR | Status: AC
  Filled 2015-07-29: qty 2

## 2015-07-29 MED ORDER — DIATRIZOATE MEGLUMINE & SODIUM 66-10 % PO SOLN
15.0000 mL | Freq: Once | ORAL | Status: AC
  Administered 2015-07-29: 15 mL via ORAL

## 2015-07-29 MED ORDER — DICYCLOMINE HCL 10 MG PO CAPS
10.0000 mg | ORAL_CAPSULE | Freq: Once | ORAL | Status: AC
  Administered 2015-07-29: 10 mg via ORAL
  Filled 2015-07-29: qty 1

## 2015-07-29 MED ORDER — KETOROLAC TROMETHAMINE 30 MG/ML IJ SOLN
30.0000 mg | Freq: Once | INTRAMUSCULAR | Status: AC
  Administered 2015-07-29: 30 mg via INTRAVENOUS
  Filled 2015-07-29: qty 1

## 2015-07-29 MED ORDER — MORPHINE SULFATE (PF) 4 MG/ML IV SOLN
4.0000 mg | Freq: Once | INTRAVENOUS | Status: AC
  Administered 2015-07-29: 4 mg via INTRAVENOUS

## 2015-07-29 MED ORDER — DICYCLOMINE HCL 10 MG PO CAPS: 10.0000 mg | ORAL_CAPSULE | Freq: Three times a day (TID) | ORAL | Status: DC | PRN

## 2015-07-29 MED ORDER — KETOROLAC TROMETHAMINE 10 MG PO TABS: 10.0000 mg | ORAL_TABLET | Freq: Three times a day (TID) | ORAL | Status: AC | PRN

## 2015-07-29 MED ORDER — KETOROLAC TROMETHAMINE 10 MG PO TABS: 10.0000 mg | ORAL_TABLET | Freq: Once | ORAL | Status: DC

## 2015-07-29 MED ORDER — ONDANSETRON HCL 4 MG/2ML IJ SOLN
4.0000 mg | Freq: Once | INTRAMUSCULAR | Status: AC
  Administered 2015-07-29: 4 mg via INTRAVENOUS
  Filled 2015-07-29: qty 2

## 2015-07-29 NOTE — ED Notes (Signed)
MD Brown at bedside.

## 2015-07-29 NOTE — ED Provider Notes (Signed)
[facility] Emergency Department Provider Note  ____________________________________________  Time seen: 2:20 AM  I have reviewed the triage vital signs and the nursing notes.   HISTORY  Chief Complaint Abdominal Pain      HPI Michael [name] is a 25 y.o. male with history of chronic abdominal pain presents with pain consistent with previous episodes of abdominal pain for which patient has received a CT scan of the abdomen endoscopy and colonoscopy performed without clear etiology for the patient's pain. Patient's mother states that he has been referred to a general surgeon for cholecystectomy with an appointment scheduled for Friday. However patient states tonight after having bowel movement he screen sharp abdominal pain in the same area that he always has his abdominal pain. He states current pain score is 10 out of 10 denies any nausea or vomiting.     Past Medical History  Diagnosis Date  . GERD (gastroesophageal reflux disease)   . Allergy   . Asthma   . Pancreatitis   . Anxiety   . Depression   . Substance abuse     Patient Active Problem List   Diagnosis Date Noted  . Abdominal pain 06/21/2015  . Depressed 06/21/2015  . Substance induced mood disorder ([place]) 10/09/2014  . Alcohol abuse 10/09/2014  . Marijuana abuse 10/09/2014  . Bipolar disorder ([place]) 10/09/2014    Past Surgical History  Procedure Laterality Date  . Tonsillectomy and adenoidectomy      Current Outpatient Rx  Name  Route  Sig  Dispense  Refill  . ondansetron (ZOFRAN) 8 MG tablet   Oral   Take 1 tablet (8 mg total) by mouth every 8 (eight) hours as needed for nausea or vomiting.   60 tablet   3   . pantoprazole (PROTONIX) 40 MG tablet   Oral   Take 1 tablet (40 mg total) by mouth 2 (two) times daily.   60 tablet   3   . promethazine (PHENERGAN) 25 MG tablet   Oral   Take 1 tablet (25 mg total) by mouth every 6 (six) hours as needed for nausea or  vomiting.   60 tablet   0     Allergies Bee venom; Quetiapine; Serotonin reuptake inhibitors (ssris); Gabapentin; Lurasidone; Prednisone; and Latuda  Family History  Problem Relation Age of Onset  . Adopted: Yes  . Ulcers Father   . Cancer Paternal Grandfather     lung  . Diabetes Paternal Grandfather     Social History Social History  Substance Use Topics  . Smoking status: Current Every Day Smoker -- 1.00 packs/day    Types: Cigarettes  . Smokeless tobacco: Former Systems developer  . Alcohol Use: No    Review of Systems  Constitutional: Negative for fever. Eyes: Negative for visual changes. ENT: Negative for sore throat. Cardiovascular: Negative for chest pain. Respiratory: Negative for shortness of breath. Gastrointestinal:Positive for abdominal pain Genitourinary: Negative for dysuria. Musculoskeletal: Negative for back pain. Skin: Negative for rash. Neurological: Negative for headaches, focal weakness or numbness.   10-point ROS otherwise negative.  ____________________________________________   PHYSICAL EXAM:  VITAL SIGNS: ED Triage Vitals  Enc Vitals Group     BP 07/29/15 0020 119/78 mmHg     Pulse Rate 07/29/15 0017 80     Resp 07/29/15 0017 20     Temp 07/29/15 0017 98.3 F (36.8 C)     Temp Source 07/29/15 0017 Oral     SpO2 07/29/15 0017 96 %  Weight 07/29/15 0017 190 lb (86.183 kg)     Height 07/29/15 0017 5\' 7"  (1.702 m)     Head Cir --      Peak Flow --      Pain Score 07/29/15 0019 10     Pain Loc --      Pain Edu? --      Excl. in Effort? --      Constitutional: Alert and oriented. Well appearing and in no distress. Eyes: Conjunctivae are normal. PERRL. Normal extraocular movements. ENT   Head: Normocephalic and atraumatic.   Nose: No congestion/rhinnorhea.   Mouth/Throat: Mucous membranes are moist.   Neck: No stridor. Hematological/Lymphatic/Immunilogical: No cervical lymphadenopathy. Cardiovascular: Normal rate, regular  rhythm. Normal and symmetric distal pulses are present in all extremities. No murmurs, rubs, or gallops. Respiratory: Normal respiratory effort without tachypnea nor retractions. Breath sounds are clear and equal bilaterally. No wheezes/rales/rhonchi. Gastrointestinal: Right upper quadrant tenderness. No distention. There is no CVA tenderness. Genitourinary: deferred Musculoskeletal: Nontender with normal range of motion in all extremities. No joint effusions.  No lower extremity tenderness nor edema. Neurologic:  Normal speech and language. No gross focal neurologic deficits are appreciated. Speech is normal.  Skin:  Skin is warm, dry and intact. No rash noted. Psychiatric: Mood and affect are normal. Speech and behavior are normal. Patient exhibits appropriate insight and judgment.  ____________________________________________    LABS (pertinent positives/negatives)  Labs Reviewed  CBC - Abnormal; Notable for the following:    WBC 13.2 (*)    All other components within normal limits  URINALYSIS COMPLETEWITH MICROSCOPIC ([facility] ONLY) - Abnormal; Notable for the following:    Color, Urine YELLOW (*)    APPearance CLEAR (*)    All other components within normal limits  LIPASE, BLOOD  COMPREHENSIVE METABOLIC PANEL     RADIOLOGY  DG Abd 1 View (Final result) Result time: 07/29/15 03:04:21   Final result by Rad Results In Interface (07/29/15 03:04:21)   Narrative:   CLINICAL DATA: Generalized abdominal pain  EXAM: ABDOMEN - 1 VIEW  COMPARISON: CT 06/14/2015  FINDINGS: Normal bowel gas pattern. No abnormal stool retention or impaction. No concerning intra-abdominal mass effect calcification.  IMPRESSION: Negative.   Electronically Signed By: Monte Fantasia M.D. On: 07/29/2015 03:04             INITIAL IMPRESSION / ASSESSMENT AND PLAN / ED COURSE  Pertinent labs & imaging results that were available during my care of the patient were reviewed by me  and considered in my medical decision making (see chart for details).  She had an endoscopy colonoscopy performed which revealed no gross abnormalities with the exception of polyps which were biopsied and negative per her report. Plain x-ray of the belly revealed no gross abnormality patient without any pain at this time status post morphine considered performing a CT scan of the abdomen however the patient recently had a CT scan performed in April for the same pain without any gross abnormalities noted. Patient recommended to follow up with general surgeon or possible cholecystectomy as advised.  ____________________________________________   FINAL CLINICAL IMPRESSION(S) / ED DIAGNOSES  Final diagnoses:  Right upper quadrant pain      Gregor Hams, MD 07/29/15 [phone]

## 2015-07-29 NOTE — Discharge Instructions (Signed)

## 2015-07-29 NOTE — ED Notes (Signed)
Pt reports when he was having a BM, pt heard a "popping" noise and started to have 10/10 pain in the right lower abdomen.

## 2015-07-29 NOTE — ED Notes (Signed)
Pt brought in via ems from home.  Pt has right side abd pain for 5 weeks.  Pt had recent endo and colonoscopy.  Pt reports diarrhea x 2.  No vomiting.

## 2015-07-29 NOTE — ED Notes (Signed)
  Reviewed d/c instructions, follow-up care, and prescriptions with pt. Pt verbalized understanding 

## 2015-07-30 ENCOUNTER — Other Ambulatory Visit: Payer: Self-pay | Admitting: Surgery

## 2015-07-30 DIAGNOSIS — R1011 Right upper quadrant pain: Secondary | ICD-10-CM | POA: Diagnosis not present

## 2015-07-30 DIAGNOSIS — K829 Disease of gallbladder, unspecified: Secondary | ICD-10-CM | POA: Diagnosis not present

## 2015-07-31 ENCOUNTER — Other Ambulatory Visit: Payer: Self-pay | Admitting: Surgery

## 2015-08-04 ENCOUNTER — Encounter: Payer: Self-pay | Admitting: Internal Medicine

## 2015-08-10 ENCOUNTER — Encounter (HOSPITAL_COMMUNITY): Payer: Self-pay

## 2015-08-10 NOTE — Progress Notes (Signed)
CMET, LIPASE 07-29-15 EPIC CHEST XRAY 06-21-15 EPIC

## 2015-08-10 NOTE — Patient Instructions (Addendum)
Michael [name]  08/10/2015   Your procedure is scheduled on: 08-16-15  Report to [facility] Main  Entrance take [facility]  elevators to 3rd floor to  [place] at 800 AM.  Call this number if you have problems the morning of surgery [phone]   Remember: ONLY 1 PERSON MAY GO WITH YOU TO SHORT STAY TO GET  READY MORNING OF [place].  Do not eat food or drink liquids :After Midnight.     Take these medicines the morning of surgery with A SIP OF WATER: PANTAPRAZOLE ([place])              You may not have any metal on your body including hair pins and              piercings  Do not wear jewelry, make-up, lotions, powders or perfumes, deodorant             Do not wear nail polish.  Do not shave  48 hours prior to surgery.              Men may shave face and neck.   Do not bring valuables to the hospital. [place].  Contacts, dentures or bridgework may not be worn into surgery.  Leave suitcase in the car. After surgery it may be brought to your room.     Patients discharged the day of surgery will not be allowed to drive home.  Name and phone number of your driver:mom elizabeth Kothari cell [phone]  Special Instructions: N/A              Please read over the following fact sheets you were given: _____________________________________________________________________             [facility] - Preparing for Surgery Before surgery, you can play an important role.  Because skin is not sterile, your skin needs to be as free of germs as possible.  You can reduce the number of germs on your skin by washing with CHG (chlorahexidine gluconate) soap before surgery.  CHG is an antiseptic cleaner which kills germs and bonds with the skin to continue killing germs even after washing. Please DO NOT use if you have an allergy to CHG or antibacterial soaps.  If your skin becomes reddened/irritated stop using the  CHG and inform your nurse when you arrive at Short Stay. Do not shave (including legs and underarms) for at least 48 hours prior to the first CHG shower.  You may shave your face/neck. Please follow these instructions carefully:  1.  Shower with CHG Soap the night before surgery and the  morning of Surgery.  2.  If you choose to wash your hair, wash your hair first as usual with your  normal  shampoo.  3.  After you shampoo, rinse your hair and body thoroughly to remove the  shampoo.                           4.  Use CHG as you would any other liquid soap.  You can apply chg directly  to the skin and wash                       Gently with  a scrungie or clean washcloth.  5.  Apply the CHG Soap to your body ONLY FROM THE NECK DOWN.   Do not use on face/ open                           Wound or open sores. Avoid contact with eyes, ears mouth and genitals (private parts).                       Wash face,  Genitals (private parts) with your normal soap.             6.  Wash thoroughly, paying special attention to the area where your surgery  will be performed.  7.  Thoroughly rinse your body with warm water from the neck down.  8.  DO NOT shower/wash with your normal soap after using and rinsing off  the CHG Soap.                9.  Pat yourself dry with a clean towel.            10.  Wear clean pajamas.            11.  Place clean sheets on your bed the night of your first shower and do not  sleep with pets. Day of Surgery : Do not apply any lotions/deodorants the morning of surgery.  Please wear clean clothes to the hospital/surgery center.  FAILURE TO FOLLOW THESE INSTRUCTIONS MAY RESULT IN THE CANCELLATION OF YOUR SURGERY PATIENT SIGNATURE_________________________________  NURSE SIGNATURE__________________________________  ________________________________________________________________________

## 2015-08-11 ENCOUNTER — Encounter (HOSPITAL_COMMUNITY)
Admission: RE | Admit: 2015-08-11 | Discharge: 2015-08-11 | Disposition: A | Discharge status: Home / Self Care | Payer: Medicare Other | Source: Ambulatory Visit | Attending: Surgery | Admitting: Surgery

## 2015-08-11 ENCOUNTER — Encounter (HOSPITAL_COMMUNITY): Payer: Self-pay

## 2015-08-11 DIAGNOSIS — Z01812 Encounter for preprocedural laboratory examination: Secondary | ICD-10-CM | POA: Insufficient documentation

## 2015-08-11 DIAGNOSIS — K829 Disease of gallbladder, unspecified: Secondary | ICD-10-CM | POA: Insufficient documentation

## 2015-08-11 DIAGNOSIS — Z01818 Encounter for other preprocedural examination: Secondary | ICD-10-CM | POA: Diagnosis not present

## 2015-08-11 HISTORY — DX: Chest pain, unspecified: R07.9

## 2015-08-11 LAB — CBC WITH DIFFERENTIAL/PLATELET
Basophils Absolute: 0.1 10*3/uL (ref 0.0–0.1)
Basophils Relative: 1 %
EOS PCT: 12 %
Eosinophils Absolute: 1.1 10*3/uL — ABNORMAL HIGH (ref 0.0–0.7)
HEMATOCRIT: 44.7 % (ref 39.0–52.0)
Hemoglobin: 15.6 g/dL (ref 13.0–17.0)
LYMPHS ABS: 3.2 10*3/uL (ref 0.7–4.0)
LYMPHS PCT: 34 %
MCH: 31.4 pg (ref 26.0–34.0)
MCHC: 34.9 g/dL (ref 30.0–36.0)
MCV: 89.9 fL (ref 78.0–100.0)
Monocytes Absolute: 0.8 10*3/uL (ref 0.1–1.0)
Monocytes Relative: 8 %
NEUTROS ABS: 4.3 10*3/uL (ref 1.7–7.7)
Neutrophils Relative %: 45 %
PLATELETS: 214 10*3/uL (ref 150–400)
RBC: 4.97 MIL/uL (ref 4.22–5.81)
RDW: 13.4 % (ref 11.5–15.5)
WBC: 9.4 10*3/uL (ref 4.0–10.5)

## 2015-08-12 DIAGNOSIS — H1045 Other chronic allergic conjunctivitis: Secondary | ICD-10-CM | POA: Diagnosis not present

## 2015-08-12 DIAGNOSIS — F17200 Nicotine dependence, unspecified, uncomplicated: Secondary | ICD-10-CM | POA: Diagnosis not present

## 2015-08-12 DIAGNOSIS — R05 Cough: Secondary | ICD-10-CM | POA: Diagnosis not present

## 2015-08-12 DIAGNOSIS — J309 Allergic rhinitis, unspecified: Secondary | ICD-10-CM | POA: Diagnosis not present

## 2015-08-12 LAB — HEMOGLOBIN A1C
Hgb A1c MFr Bld: 5.3 % (ref 4.8–5.6)
Mean Plasma Glucose: 105 mg/dL

## 2015-08-15 NOTE — Anesthesia Preprocedure Evaluation (Addendum)
Anesthesia Evaluation  Patient identified by MRN, date of birth, ID band Patient awake    Reviewed: Allergy & Precautions, NPO status , Patient's Chart, lab work & pertinent test results  Airway Mallampati: II  TM Distance: <3 FB Neck ROM: Full    Dental  (+) Teeth Intact, Dental Advisory Given   Pulmonary asthma , Current Smoker,    Pulmonary exam normal breath sounds clear to auscultation       Cardiovascular Exercise Tolerance: Good negative cardio ROS Normal cardiovascular exam Rhythm:Regular Rate:Normal     Neuro/Psych PSYCHIATRIC DISORDERS Anxiety Depression Bipolar Disorder negative neurological ROS     GI/Hepatic GERD  Medicated,(+)     substance abuse  marijuana use and IV drug use, Gallbladder disease H/o pancreatitis   Endo/Other  Obesity   Renal/GU negative Renal ROS     Musculoskeletal negative musculoskeletal ROS (+)   Abdominal   Peds  Hematology negative hematology ROS (+)   Anesthesia Other Findings Day of surgery medications reviewed with the patient.  Reproductive/Obstetrics                           Anesthesia Physical Anesthesia Plan  ASA: II  Anesthesia Plan: General   Post-op Pain Management:    Induction: Intravenous  Airway Management Planned: Oral ETT  Additional Equipment:   Intra-op Plan:   Post-operative Plan: Extubation in OR  Informed Consent: I have reviewed the patients History and Physical, chart, labs and discussed the procedure including the risks, benefits and alternatives for the proposed anesthesia with the patient or authorized representative who has indicated his/her understanding and acceptance.   Dental advisory given  Plan Discussed with: CRNA  Anesthesia Plan Comments: (Risks/benefits of general anesthesia discussed with patient including risk of damage to teeth, lips, gum, and tongue, nausea/vomiting, allergic reactions to  medications, and the possibility of heart attack, stroke and death.  All patient questions answered.  Patient wishes to proceed.)        Anesthesia Quick Evaluation

## 2015-08-15 NOTE — H&P (Signed)
Michael [name] Location: [facility] Patient #: [id number] DOB: 11/06/1990 Single / Language: Michael [name] / Race: White Male   History of Present Illness   The patient is a 25 year old male who presents with a complaint of gall bladder disease.   His PCP is Dr. Candie [name]  His GI doctor is Dr. Rande [name].  His mother is with him.   Michael [name] is a young male who has had an odd history. His symptoms began around November 2016. He complains of abdominal pain and cramping, which began about 8 months ago. He gives a history of heroin use up until about 1 and 1/2 years ago, but says he has stopped in that time. He still uses marijuana. He smokes cigarettes. He is thinking about changing to "vapors".  Over the last 2 to 3 months, he thinks that the pain has become worse. He says that he has reflux. He has diarrhea (3 to 4 loose stools per day). Aleve did not help the pain. Food tends to make the pain worse.  He thinks that he has lost 10 to 20 pounds since December from these symptoms. Our weight today was 207. He had recorded weights of 199 on 06/24/2015 and 203 on 07/09/2015 that I could find in the chart.   He underwent a colonoscopy on 07/23/2015 - by Dr. Ardis [name] - he was found to have multiple ileal polyps. Biopsies of these showed lymphoid aggregates. He plans repeat colonoscopy in 5 years. He had an upper endo on 07/23/2015 - which showed a 4 cm HH.  He had a HIDA scon on 06/24/2015 which showed a 76% EF. He did have pain with the CCK. He had an [place] of abdomen on 06/14/2015 which was negative (no gall stones). He had a CT scan of the abdomen on 06/24/2015 which was negative.   He was back in the ER at [facility] on 07/29/2015 for the same abdominal pain. Normal lipase, LFT's, but his WBC wsa mildly elevated to 13,200.  I discussed with the patient the indications and risks of gall bladder surgery. The primary risks of gall bladder surgery include, but are  not limited to, bleeding, infection, common bile duct injury, and open surgery. There is also the risk that the patient may have continued symptoms after surgery. We discussed the typical post-operative recovery course. I tried to answer the patient's questions. I gave the patient literature about gall bladder surgery.  I discussed with the patient and mother, that with a normal [place] and HIDA scan, the chance of improvement of his symptoms from a cholecystectomy is probably about 30%. Which means it is most likely that the surgery will not help him. I think that he has some significant unaddressed psych issues, but removing the gall bladder will remove that as a potential source of his abdominal symptoms.  Plan: 1) proceed with cholecystectomy, 2) obtain urine drug screen at the time of surgery  Past Medical HIstory: 1. History of EtOH use History of cocaine use - by his history, his last used about 1/2 years ago  2. Depression/bilpolar He has been hospitalized a couple of times for psych reasons. He is currently not seeing psychiatry. We talked about his seeing a psychiatrist. (by looking at [place], this discussion has occurred before) He saw Dr. Alethia [name] at [facility] on 10/09/2014 for suicidal statements. He was diagnosed with a "substance abuse mood disorder". There are comments in Michael [name] notes that he was using heroin and cocaine with  a couple of days of his presentation (that is not the 1 and 1/2 years "clean" that he told me). His blood EtOH was 193 on that visit. He was being seen by [facility] in [place] until about 6 months ago. He had an involuntary commitment by mother in 08/14/2013. There is a note from Michael [name] on 08/14/2013. He was hospitalized from 01/13/2103 - 01/20/2013 for "schizoaffective disorder" at [facility]. Michael [name] was the treating physician. 3. Right rotator cuff surgery in 2013 by Dr. Drema [name] at  [place]. 4. Smokes   Social History: Lives with parents. Note: he is adopted. Mother, Michael [name], is with him. He is not working.    Allergies Michael [name], CMA; 07/30/2015 2:08 PM) Gabapentin *CHEMICALS* PredniSONE Intensol *CORTICOSTEROIDS* Serotonin HCl *CHEMICALS*  Medication History Michael [name], CMA; 07/30/2015 2:08 PM) Ondansetron HCl (8MG  Tablet, Oral) Active. Pantoprazole Sodium (40MG  Tablet DR, Oral) Active. Bentyl (10MG  Capsule, Oral) Active. Medications Reconciled  Vitals Michael [name] CMA; 07/30/2015 2:09 PM) 07/30/2015 2:08 PM Weight: 207 lb Height: 67in Body Surface Area: 2.05 m Body Mass Index: 32.42 kg/m  Temp.: 97.100F  Pulse: 82 (Regular)  BP: 130/80 (Sitting, Left Arm, Standard)  Physical Exam  General: Mildiy obese WM alert. HEENT: Normal. Pupils equal.  Neck: Supple. No mass. No thyroid mass.  Lymph Nodes: No supraclavicular or cervical nodes.  Lungs: Clear to auscultation and symmetric breath sounds. Heart: RRR. No murmur or rub.  Abdomen: Soft. No mass. No hernia. Normal bowel sounds. No abdominal scars. Mild tenderness in his right abdomen - RUQ > RLQ. No peritoneal signs.  Extremities: Good strength and ROM in upper and lower extremities.  Neurologic: Grossly intact to motor and sensory function. Psychiatric: Behavior is normal. I did not think that he had any specific behavioural issue today.  Assessment & Plan  1.  RIGHT UPPER QUADRANT ABDOMINAL PAIN (R10.11)  GALL BLADDER DISEASE (K82.9)   Plan:   1) Cholecystectomy  2. History of EtOH use  History of cocaine use - by his history, his last used about 1/2 years ago 3. Depression/bilpolar  He has been hospitalized a couple of times for psych reasons. 4. Right rotator cuff surgery in 2013 by Dr. Drema [name] at [place]. [place]  Michael Overall, MD, [facility] Pager: [phone] Office phone:  [phone]

## 2015-08-16 ENCOUNTER — Ambulatory Visit: Payer: 59 | Admitting: Internal Medicine

## 2015-08-16 ENCOUNTER — Ambulatory Visit (HOSPITAL_COMMUNITY)
Admission: RE | Admit: 2015-08-16 | Discharge: 2015-08-16 | Disposition: A | Discharge status: Home / Self Care | Payer: Medicare Other | Source: Ambulatory Visit | Attending: Surgery | Admitting: Surgery

## 2015-08-16 ENCOUNTER — Encounter (HOSPITAL_COMMUNITY): Payer: Self-pay | Admitting: *Deleted

## 2015-08-16 ENCOUNTER — Encounter (HOSPITAL_COMMUNITY)
Admission: RE | Disposition: A | Discharge status: Home / Self Care | Payer: Self-pay | Source: Ambulatory Visit | Attending: Surgery

## 2015-08-16 ENCOUNTER — Ambulatory Visit (HOSPITAL_COMMUNITY): Payer: Medicare Other | Admitting: Anesthesiology

## 2015-08-16 ENCOUNTER — Ambulatory Visit (HOSPITAL_COMMUNITY): Payer: Medicare Other

## 2015-08-16 DIAGNOSIS — F319 Bipolar disorder, unspecified: Secondary | ICD-10-CM | POA: Diagnosis not present

## 2015-08-16 DIAGNOSIS — Z79899 Other long term (current) drug therapy: Secondary | ICD-10-CM | POA: Diagnosis not present

## 2015-08-16 DIAGNOSIS — K829 Disease of gallbladder, unspecified: Secondary | ICD-10-CM | POA: Diagnosis not present

## 2015-08-16 DIAGNOSIS — F17210 Nicotine dependence, cigarettes, uncomplicated: Secondary | ICD-10-CM | POA: Insufficient documentation

## 2015-08-16 DIAGNOSIS — F1290 Cannabis use, unspecified, uncomplicated: Secondary | ICD-10-CM | POA: Insufficient documentation

## 2015-08-16 DIAGNOSIS — K8010 Calculus of gallbladder with chronic cholecystitis without obstruction: Secondary | ICD-10-CM | POA: Diagnosis not present

## 2015-08-16 HISTORY — PX: CHOLECYSTECTOMY: SHX55

## 2015-08-16 SURGERY — LAPAROSCOPIC CHOLECYSTECTOMY
Anesthesia: General

## 2015-08-16 MED ORDER — LIDOCAINE HCL (CARDIAC) 20 MG/ML IV SOLN
INTRAVENOUS | Status: AC
  Filled 2015-08-16: qty 5

## 2015-08-16 MED ORDER — FENTANYL CITRATE (PF) 100 MCG/2ML IJ SOLN
INTRAMUSCULAR | Status: AC
  Filled 2015-08-16: qty 2

## 2015-08-16 MED ORDER — EPHEDRINE SULFATE 50 MG/ML IJ SOLN
INTRAMUSCULAR | Status: AC
  Filled 2015-08-16: qty 1

## 2015-08-16 MED ORDER — MIDAZOLAM HCL 2 MG/2ML IJ SOLN
INTRAMUSCULAR | Status: AC
  Filled 2015-08-16: qty 2

## 2015-08-16 MED ORDER — HYDROMORPHONE HCL 1 MG/ML IJ SOLN
INTRAMUSCULAR | Status: AC
  Filled 2015-08-16: qty 1

## 2015-08-16 MED ORDER — LACTATED RINGERS IV SOLN
INTRAVENOUS | Status: DC
  Administered 2015-08-16 (×2): via INTRAVENOUS

## 2015-08-16 MED ORDER — KETAMINE HCL 10 MG/ML IJ SOLN
INTRAMUSCULAR | Status: DC | PRN
  Administered 2015-08-16: 20 mg via INTRAVENOUS
  Administered 2015-08-16: 30 mg via INTRAVENOUS

## 2015-08-16 MED ORDER — HYDROCODONE-ACETAMINOPHEN 5-325 MG PO TABS
2.0000 | ORAL_TABLET | Freq: Once | ORAL | Status: AC
  Administered 2015-08-16: 2 via ORAL
  Filled 2015-08-16: qty 2

## 2015-08-16 MED ORDER — METOCLOPRAMIDE HCL 5 MG/ML IJ SOLN
INTRAMUSCULAR | Status: DC | PRN
  Administered 2015-08-16: 10 mg via INTRAVENOUS

## 2015-08-16 MED ORDER — HYDROMORPHONE HCL 2 MG/ML IJ SOLN
INTRAMUSCULAR | Status: AC
  Filled 2015-08-16: qty 1

## 2015-08-16 MED ORDER — CHLORHEXIDINE GLUCONATE 4 % EX LIQD: 60.0000 mL | Freq: Once | CUTANEOUS | Status: DC

## 2015-08-16 MED ORDER — PROPOFOL 10 MG/ML IV BOLUS
INTRAVENOUS | Status: AC
  Filled 2015-08-16: qty 20

## 2015-08-16 MED ORDER — ROCURONIUM BROMIDE 100 MG/10ML IV SOLN
INTRAVENOUS | Status: DC | PRN
  Administered 2015-08-16: 10 mg via INTRAVENOUS
  Administered 2015-08-16: 50 mg via INTRAVENOUS

## 2015-08-16 MED ORDER — KETAMINE HCL 10 MG/ML IJ SOLN
INTRAMUSCULAR | Status: AC
  Filled 2015-08-16: qty 1

## 2015-08-16 MED ORDER — HYDROCODONE-ACETAMINOPHEN 5-325 MG PO TABS: 1.0000 | ORAL_TABLET | Freq: Four times a day (QID) | ORAL | Status: DC | PRN

## 2015-08-16 MED ORDER — CEFAZOLIN SODIUM-DEXTROSE 2-4 GM/100ML-% IV SOLN
2.0000 g | INTRAVENOUS | Status: AC
  Administered 2015-08-16: 2 g via INTRAVENOUS
  Filled 2015-08-16: qty 100

## 2015-08-16 MED ORDER — SUGAMMADEX SODIUM 200 MG/2ML IV SOLN
INTRAVENOUS | Status: DC | PRN
  Administered 2015-08-16: 200 mg via INTRAVENOUS

## 2015-08-16 MED ORDER — FENTANYL CITRATE (PF) 250 MCG/5ML IJ SOLN
INTRAMUSCULAR | Status: AC
  Filled 2015-08-16: qty 5

## 2015-08-16 MED ORDER — ONDANSETRON HCL 4 MG/2ML IJ SOLN
INTRAMUSCULAR | Status: DC | PRN
  Administered 2015-08-16: 4 mg via INTRAVENOUS

## 2015-08-16 MED ORDER — HYDROMORPHONE HCL 1 MG/ML IJ SOLN
INTRAMUSCULAR | Status: DC | PRN
  Administered 2015-08-16: 1 mg via INTRAVENOUS
  Administered 2015-08-16 (×2): 0.5 mg via INTRAVENOUS

## 2015-08-16 MED ORDER — ROCURONIUM BROMIDE 100 MG/10ML IV SOLN
INTRAVENOUS | Status: AC
  Filled 2015-08-16: qty 1

## 2015-08-16 MED ORDER — FENTANYL CITRATE (PF) 100 MCG/2ML IJ SOLN
INTRAMUSCULAR | Status: DC | PRN
  Administered 2015-08-16 (×7): 50 ug via INTRAVENOUS

## 2015-08-16 MED ORDER — PROPOFOL 10 MG/ML IV BOLUS
INTRAVENOUS | Status: DC | PRN
  Administered 2015-08-16: 200 mg via INTRAVENOUS

## 2015-08-16 MED ORDER — LACTATED RINGERS IR SOLN
Status: DC | PRN
  Administered 2015-08-16: 1000 mL

## 2015-08-16 MED ORDER — CEFAZOLIN SODIUM-DEXTROSE 2-4 GM/100ML-% IV SOLN
INTRAVENOUS | Status: AC
  Filled 2015-08-16: qty 100

## 2015-08-16 MED ORDER — LIDOCAINE HCL (CARDIAC) 20 MG/ML IV SOLN
INTRAVENOUS | Status: DC | PRN
  Administered 2015-08-16: 50 mg via INTRAVENOUS

## 2015-08-16 MED ORDER — IOPAMIDOL (ISOVUE-300) INJECTION 61%
INTRAVENOUS | Status: AC
  Filled 2015-08-16: qty 50

## 2015-08-16 MED ORDER — PROMETHAZINE HCL 25 MG/ML IJ SOLN: 6.2500 mg | INTRAMUSCULAR | Status: DC | PRN

## 2015-08-16 MED ORDER — ONDANSETRON HCL 4 MG/2ML IJ SOLN
INTRAMUSCULAR | Status: AC
  Filled 2015-08-16: qty 2

## 2015-08-16 MED ORDER — EPHEDRINE SULFATE 50 MG/ML IJ SOLN
INTRAMUSCULAR | Status: DC | PRN
  Administered 2015-08-16: 5 mg via INTRAVENOUS

## 2015-08-16 MED ORDER — SUCCINYLCHOLINE CHLORIDE 20 MG/ML IJ SOLN
INTRAMUSCULAR | Status: DC | PRN
  Administered 2015-08-16: 100 mg via INTRAVENOUS

## 2015-08-16 MED ORDER — MIDAZOLAM HCL 5 MG/5ML IJ SOLN
INTRAMUSCULAR | Status: DC | PRN
Start: 2015-08-16 — End: 2015-08-16
  Administered 2015-08-16 (×2): 1 mg via INTRAVENOUS

## 2015-08-16 MED ORDER — FENTANYL CITRATE (PF) 100 MCG/2ML IJ SOLN
25.0000 ug | INTRAMUSCULAR | Status: DC | PRN
  Administered 2015-08-16: 50 ug via INTRAVENOUS

## 2015-08-16 MED ORDER — BUPIVACAINE-EPINEPHRINE 0.25% -1:200000 IJ SOLN
INTRAMUSCULAR | Status: AC
  Filled 2015-08-16: qty 1

## 2015-08-16 MED ORDER — SODIUM CHLORIDE 0.9 % IJ SOLN
INTRAMUSCULAR | Status: AC
  Filled 2015-08-16: qty 10

## 2015-08-16 MED ORDER — SODIUM CHLORIDE 0.9 % IV SOLN
INTRAVENOUS | Status: DC | PRN
  Administered 2015-08-16: 1 mL

## 2015-08-16 MED ORDER — BUPIVACAINE-EPINEPHRINE 0.25% -1:200000 IJ SOLN
INTRAMUSCULAR | Status: DC | PRN
  Administered 2015-08-16: 30 mL

## 2015-08-16 MED ORDER — 0.9 % SODIUM CHLORIDE (POUR BTL) OPTIME
TOPICAL | Status: DC | PRN
  Administered 2015-08-16: 1000 mL

## 2015-08-16 SURGICAL SUPPLY — 40 items
APPLIER CLIP 5 13 M/L LIGAMAX5 (MISCELLANEOUS) ×4
APPLIER CLIP ROT 10 11.4 M/L (STAPLE)
BAG RETRIEVAL 10 (BASKET) ×1
BAG RETRIEVAL 10MM (BASKET) ×1
BENZOIN TINCTURE PRP APPL 2/3 (GAUZE/BANDAGES/DRESSINGS) IMPLANT
CABLE HIGH FREQUENCY MONO STRZ (ELECTRODE) ×4 IMPLANT
CHLORAPREP W/TINT 26ML (MISCELLANEOUS) ×4 IMPLANT
CHOLANGIOGRAM CATH TAUT (CATHETERS) ×4 IMPLANT
CLIP APPLIE 5 13 M/L LIGAMAX5 (MISCELLANEOUS) ×2 IMPLANT
CLIP APPLIE ROT 10 11.4 M/L (STAPLE) IMPLANT
CLOSURE WOUND 1/4X4 (GAUZE/BANDAGES/DRESSINGS)
COVER MAYO STAND STRL (DRAPES) ×4 IMPLANT
COVER SURGICAL LIGHT HANDLE (MISCELLANEOUS) IMPLANT
DECANTER SPIKE VIAL GLASS SM (MISCELLANEOUS) ×4 IMPLANT
DRAPE C-ARM 42X120 X-RAY (DRAPES) ×4 IMPLANT
ELECT REM PT RETURN 9FT ADLT (ELECTROSURGICAL) ×4
ELECTRODE REM PT RTRN 9FT ADLT (ELECTROSURGICAL) ×2 IMPLANT
GLOVE SURG SIGNA 7.5 PF LTX (GLOVE) ×4 IMPLANT
GOWN STRL REUS W/TWL XL LVL3 (GOWN DISPOSABLE) ×8 IMPLANT
HEMOSTAT SURGICEL 4X8 (HEMOSTASIS) IMPLANT
IV CATH 14GX2 1/4 (CATHETERS) ×4 IMPLANT
IV SET EXTENSION CATH 6 NF (IV SETS) ×4 IMPLANT
KIT BASIN OR (CUSTOM PROCEDURE TRAY) ×4 IMPLANT
LIQUID BAND (GAUZE/BANDAGES/DRESSINGS) ×4 IMPLANT
POUCH SPECIMEN RETRIEVAL 10MM (ENDOMECHANICALS) IMPLANT
SCISSORS LAP 5X35 DISP (ENDOMECHANICALS) ×4 IMPLANT
SET IRRIG TUBING LAPAROSCOPIC (IRRIGATION / IRRIGATOR) ×4 IMPLANT
SLEEVE ADV FIXATION 5X100MM (TROCAR) ×4 IMPLANT
STOPCOCK 4 WAY LG BORE MALE ST (IV SETS) ×4 IMPLANT
STRIP CLOSURE SKIN 1/4X4 (GAUZE/BANDAGES/DRESSINGS) IMPLANT
SUT MNCRL AB 4-0 PS2 18 (SUTURE) ×4 IMPLANT
SYR 10ML ECCENTRIC (SYRINGE) ×4 IMPLANT
SYS BAG RETRIEVAL 10MM (BASKET) ×2
SYSTEM BAG RETRIEVAL 10MM (BASKET) ×2 IMPLANT
TOWEL OR 17X26 10 PK STRL BLUE (TOWEL DISPOSABLE) ×4 IMPLANT
TRAY LAPAROSCOPIC (CUSTOM PROCEDURE TRAY) ×4 IMPLANT
TROCAR ADV FIXATION 11X100MM (TROCAR) IMPLANT
TROCAR ADV FIXATION 5X100MM (TROCAR) ×8 IMPLANT
TROCAR XCEL BLUNT TIP 100MML (ENDOMECHANICALS) ×4 IMPLANT
TUBING INSUF HEATED (TUBING) ×4 IMPLANT

## 2015-08-16 NOTE — Anesthesia Postprocedure Evaluation (Signed)
Anesthesia Post Note  Patient: [place] P Birnie  Procedure(s) Performed: Procedure(s): LAPAROSCOPIC CHOLECYSTECTOMY  Patient location during evaluation: PACU Anesthesia Type: General Level of consciousness: awake and alert Pain management: pain level controlled Vital Signs Assessment: post-procedure vital signs reviewed and stable Respiratory status: spontaneous breathing, nonlabored ventilation, respiratory function stable and patient connected to nasal cannula oxygen Cardiovascular status: blood pressure returned to baseline and stable Postop Assessment: no signs of nausea or vomiting Anesthetic complications: no    Last Vitals:  Filed Vitals:   08/16/15 1142 08/16/15 1244  BP: 154/90 147/85  Pulse: 79 82  Temp: 36.7 C   Resp: 16 16    Last Pain:  Filed Vitals:   08/16/15 1408  PainSc: 7                  Catalina Gravel

## 2015-08-16 NOTE — Discharge Instructions (Signed)
[facility] - DISCHARGE INSTRUCTIONS TO PATIENT  Activity:  Driving - May drive in 3 or 4 days, when off pain meds    Lifting - No lifting more than 15 pounds for 1 week, then no limit  Wound Care:   Leave incision dry for 2 days, then my shower.        No public water (lake, pool, etc) until seen back in office  Diet:  As tolerated  Follow up appointment:  Call Dr. Pollie Friar office [facility]) at [phone] for an appointment in 2 to 3 weeks.  Medications and dosages:  Resume your home medications.  You have a prescription for:  Vicodin  Call Dr. Lucia Gaskins or his office  [phone]) if you have:  Temperature greater than 100.4,  Persistent nausea and vomiting,  Severe uncontrolled pain,  Redness, tenderness, or signs of infection (pain, swelling, redness, odor or green/yellow discharge around the site),  Difficulty breathing, headache or visual disturbances,  Any other questions or concerns you may have after discharge.  In an emergency, call 911 or go to an Emergency Department at a nearby hospital.

## 2015-08-16 NOTE — Transfer of Care (Signed)
Immediate Anesthesia Transfer of Care Note  Patient: Michael [name]  Procedure(s) Performed: Procedure(s): LAPAROSCOPIC CHOLECYSTECTOMY  Patient Location: PACU  Anesthesia Type:General  Level of Consciousness: Patient easily awoken, sedated, comfortable, cooperative, following commands, responds to stimulation.   Airway & Oxygen Therapy: Patient spontaneously breathing, ventilating well, oxygen via simple oxygen mask.  Post-op Assessment: Report given to PACU RN, vital signs reviewed and stable, moving all extremities.   Post vital signs: Reviewed and stable.  Complications: No apparent anesthesia complications

## 2015-08-16 NOTE — Interval H&P Note (Signed)
History and Physical Interval Note:  08/16/2015 9:32 AM  Michael [name]  has presented today for surgery, with the diagnosis of gall bladder disease  The various methods of treatment have been discussed with the patient and family.  He's still having pain.  His mother is here with him today.  After consideration of risks, benefits and other options for treatment, the patient has consented to  Procedure(s): LAPAROSCOPIC CHOLECYSTECTOMY WITH INTRAOPERATIVE CHOLANGIOGRAM (N/A) as a surgical intervention .  The patient's history has been reviewed, patient examined, no change in status, stable for surgery.  I have reviewed the patient's chart and labs.  Questions were answered to the patient's satisfaction.     Batool Majid H

## 2015-08-16 NOTE — Anesthesia Procedure Notes (Signed)
Procedure Name: Intubation Date/Time: 08/16/2015 9:35 AM Performed by: Deliah Boston Pre-anesthesia Checklist: Patient identified, Emergency Drugs available, Suction available and Patient being monitored Patient Re-evaluated:Patient Re-evaluated prior to inductionOxygen Delivery Method: Circle system utilized Preoxygenation: Pre-oxygenation with 100% oxygen Intubation Type: IV induction Ventilation: Mask ventilation without difficulty Laryngoscope Size: Mac and 4 Grade View: Grade I Tube type: Oral Tube size: 8.0 mm Number of attempts: 1 Airway Equipment and Method: Oral airway Placement Confirmation: ETT inserted through vocal cords under direct vision,  positive ETCO2 and breath sounds checked- equal and bilateral Secured at: 23 cm Tube secured with: Tape Dental Injury: Teeth and Oropharynx as per pre-operative assessment

## 2015-08-16 NOTE — Op Note (Signed)
08/16/2015  11:15 AM  PATIENT:  Michael [name], 25 y.o., male, MRN: [id number]  PREOP DIAGNOSIS:  gall bladder disease  POSTOP DIAGNOSIS:   Gall bladder disease  [photos in chart]  PROCEDURE:   Procedure(s): LAPAROSCOPIC CHOLECYSTECTOMY (no cholangiogram)  SURGEON:   Alphonsa Overall, M.D.  Terrence DupontRockne Coons, M.D.  ANESTHESIA:   general  Anesthesiologist: Catalina Gravel, MD CRNA: Deliah Boston, CRNA  * No anesthesia type entered *  ASA: 2  EBL:  minimal  ml  BLOOD ADMINISTERED: none  DRAINS: none   LOCAL MEDICATIONS USED:   30 cc 1/4% marcaine  SPECIMEN:   Gall bladder  COUNTS CORRECT:  YES  INDICATIONS FOR PROCEDURE:  Michael [name] is a 25 y.o. (DOB: 03-10-1990) white  male whose primary care physician is Rica Mast, MD and comes for cholecystectomy.   The indications and risks of the gall bladder surgery were explained to the patient.  The risks include, but are not limited to, infection, bleeding, common bile duct injury and open surgery.  SURGERY:  The patient was taken to OR room #6 at [facility]  The abdomen was prepped with chloroprep.  The patient was given 2 gm Ancef at the beginning of the operation.   A time out was held and the surgical checklist run.   An infraumbilical incision was made into the abdominal cavity.  A 12 mm Hasson trocar was inserted into the abdominal cavity through the infraumbilical incision and secured with a 0 Vicryl suture.  Three additional trocars were inserted: a 5 mm trocar in the sub-xiphoid location, a 5 mm trocar in the right mid subcostal area, and a 5 mm trocar in the right lateral subcostal area.   The abdomen was explored and the liver, stomach, and bowel that could be seen were unremarkable.  I took photos which will be put in the chart.   The gall bladder was fatty, but otherwise looks okay.   I grasped the gall bladder and rotated it cephalad.  Disssection was carried down to the gall  bladder/cystic duct junction and the cystic duct isolated.  A clip was placed on the gall bladder side of the cystic duct.  I created a large critical view.  I tried to shoot an intra-operative cholangiogram.   But the patient had a small tortuous cystic duct and I could not thread the taut catheter into the cystic duct.    He had normal liver function tests and I thought persisting to shot a cholangiogram was not practical.   The cystic duct was tripley endoclipped and the cystic artery was identified and clipped.  The gall bladder was bluntly and sharpley dissected from the gall bladder bed.   After the gall bladder was removed from the liver, the gall bladder bed and [place] were inspected.  There was no bleeding or bile leak.  The gall bladder was placed in a endocatch bag and delivered through the umbilicus.  The abdomen was irrigated with 800 cc saline.   The trocars were then removed.  I infiltrated 30cc of 1/4% Marcaine into the incisions.  The umbilical port closed with a 0 Vicryl suture and the skin closed with 4-0 Monocryl.  The skin was painted with LiquidBand.  The patient's sponge and needle count were correct.  The patient was transported to the RR in good condition.  Alphonsa Overall, MD, [facility] Pager: [phone] Office phone:  [phone]

## 2015-08-25 ENCOUNTER — Ambulatory Visit: Payer: 59 | Admitting: Internal Medicine

## 2015-09-03 ENCOUNTER — Ambulatory Visit: Payer: 59 | Admitting: Internal Medicine

## 2015-09-03 DIAGNOSIS — Z0289 Encounter for other administrative examinations: Secondary | ICD-10-CM

## 2015-10-18 ENCOUNTER — Ambulatory Visit (INDEPENDENT_AMBULATORY_CARE_PROVIDER_SITE_OTHER): Payer: Medicare Other | Admitting: Family Medicine

## 2015-10-18 ENCOUNTER — Ambulatory Visit (INDEPENDENT_AMBULATORY_CARE_PROVIDER_SITE_OTHER): Payer: Medicare Other

## 2015-10-18 VITALS — BP 108/76 | HR 81 | Temp 98.4°F | Wt 198.8 lb

## 2015-10-18 DIAGNOSIS — S6991XA Unspecified injury of right wrist, hand and finger(s), initial encounter: Secondary | ICD-10-CM

## 2015-10-18 DIAGNOSIS — S6990XA Unspecified injury of unspecified wrist, hand and finger(s), initial encounter: Secondary | ICD-10-CM | POA: Insufficient documentation

## 2015-10-18 DIAGNOSIS — R2231 Localized swelling, mass and lump, right upper limb: Secondary | ICD-10-CM | POA: Diagnosis not present

## 2015-10-18 DIAGNOSIS — M79641 Pain in right hand: Secondary | ICD-10-CM | POA: Diagnosis not present

## 2015-10-18 NOTE — Progress Notes (Signed)
Pre visit review using our clinic review tool, if applicable. No additional management support is needed unless otherwise documented below in the visit note. 

## 2015-10-18 NOTE — Patient Instructions (Signed)
Nice to meet you. We will have you see the orthopedic doctor today. If you develop worsening pain, numbness, coldness in your hands, worsening tingling, weakness, or any new or changing symptoms please seek medical attention immediately.

## 2015-10-18 NOTE — Assessment & Plan Note (Signed)
Patient with injury to right hand and wrist. Concern for nerve damage particularly given his decreased mobility of his right middle finger and decreased sensation in his right middle finger. Blood flow appears to be intact with good radial pulse and good capillary refill. X-rays will be performed. He will see orthopedics this [facility] for [facility]. He is given return precautions.

## 2015-10-18 NOTE — Progress Notes (Signed)
Tommi Rumps, MD Phone: [phone]  Michael [name] is a 25 y.o. male who presents today for same-day visit.  Patient notes hurting his right wrist yesterday. He was working on his car and he got wedged between the tire and the brake. Notes he has large tires that weigh about 100 pounds each. Notes he has discomfort in his dorsal wrist and hand. Can't move his middle finger. Has tingling in the hand and numbness in his middle finger. Has not worsened.  ROS see history of present illness  Objective  Physical Exam Vitals:   10/18/15 0951  BP: 108/76  Pulse: 81  Temp: 98.4 F (36.9 C)    BP Readings from Last 3 Encounters:  10/18/15 108/76  08/16/15 (!) 147/85  07/29/15 126/77   Wt Readings from Last 3 Encounters:  10/18/15 198 lb 12.8 oz (90.2 kg)  08/16/15 200 lb (90.7 kg)  08/11/15 200 lb (90.7 kg)    Physical Exam  Constitutional: He is well-developed, well-nourished, and in no distress.  Cardiovascular: Normal rate, regular rhythm and normal heart sounds.   Pulmonary/Chest: Effort normal and breath sounds normal.  Musculoskeletal:  Right wrist and hand with mild swelling of the dorsal aspect. There is tenderness along the entire dorsal aspect of his right wrist and hand, there is tenderness along the radial and ulnar aspects as well. Some snuffbox tenderness. Hand is warm and well perfused with 2+ radial pulse. Left wrist and hand with no swelling or tenderness, and is warm and well perfused, 2+ radial pulse  Neurological: He is alert.  5 out of 5 grip strength on the left, unable to fully make a fist on the right and unable to move his right middle finger, decreased sensation in the right middle finger, intact sensation and the rest of his right hand     Assessment/Plan: Please see individual problem list.  Hand injury Patient with injury to right hand and wrist. Concern for nerve damage particularly given his decreased mobility of his right middle finger and  decreased sensation in his right middle finger. Blood flow appears to be intact with good radial pulse and good capillary refill. X-rays will be performed. He will see orthopedics this afternoon at [facility] for [facility]. He is given return precautions.   Orders Placed This Encounter  Procedures  . DG Hand Complete Right    Standing Status:   Future    Number of Occurrences:   1    Standing Expiration Date:   12/17/2016    Order Specific Question:   Reason for Exam (SYMPTOM  OR DIAGNOSIS REQUIRED)    Answer:   right hand injury with numbness in middle finger and inability to move middle finger    Order Specific Question:   Preferred imaging location?    Answer:   [facility]  . DG Wrist Complete Right    Standing Status:   Future    Number of Occurrences:   1    Standing Expiration Date:   12/17/2016    Order Specific Question:   Reason for Exam (SYMPTOM  OR DIAGNOSIS REQUIRED)    Answer:   right hand injury with numbness in middle finger and inability to move middle finger    Order Specific Question:   Preferred imaging location?    Answer:   [facility]  . Ambulatory referral to Orthopedic Surgery    Referral Priority:   Routine    Referral Type:   Surgical  Referral Reason:   Specialty Services Required    Requested Specialty:   Orthopedic Surgery    Number of Visits Requested:   [place], MD [place]

## 2015-10-21 NOTE — Progress Notes (Unsigned)
LM for patient to return call.

## 2015-10-26 ENCOUNTER — Ambulatory Visit (INDEPENDENT_AMBULATORY_CARE_PROVIDER_SITE_OTHER): Payer: Medicare Other

## 2015-10-26 ENCOUNTER — Ambulatory Visit (INDEPENDENT_AMBULATORY_CARE_PROVIDER_SITE_OTHER): Payer: Medicare Other | Admitting: Family Medicine

## 2015-10-26 ENCOUNTER — Encounter: Payer: Self-pay | Admitting: Family Medicine

## 2015-10-26 VITALS — BP 112/74 | HR 97 | Temp 98.6°F | Wt 205.0 lb

## 2015-10-26 DIAGNOSIS — R05 Cough: Secondary | ICD-10-CM

## 2015-10-26 DIAGNOSIS — R059 Cough, unspecified: Secondary | ICD-10-CM

## 2015-10-26 DIAGNOSIS — J40 Bronchitis, not specified as acute or chronic: Secondary | ICD-10-CM | POA: Diagnosis not present

## 2015-10-26 DIAGNOSIS — R062 Wheezing: Secondary | ICD-10-CM | POA: Diagnosis not present

## 2015-10-26 LAB — CBC
HEMATOCRIT: 47.4 % (ref 39.0–52.0)
Hemoglobin: 16.5 g/dL (ref 13.0–17.0)
MCHC: 34.8 g/dL (ref 30.0–36.0)
MCV: 90.7 fl (ref 78.0–100.0)
Platelets: 204 10*3/uL (ref 150.0–400.0)
RBC: 5.23 Mil/uL (ref 4.22–5.81)
RDW: 13.2 % (ref 11.5–15.5)
WBC: 8.7 10*3/uL (ref 4.0–10.5)

## 2015-10-26 LAB — COMPREHENSIVE METABOLIC PANEL
ALBUMIN: 4.8 g/dL (ref 3.5–5.2)
ALK PHOS: 91 U/L (ref 39–117)
ALT: 56 U/L — AB (ref 0–53)
AST: 31 U/L (ref 0–37)
BILIRUBIN TOTAL: 0.4 mg/dL (ref 0.2–1.2)
BUN: 13 mg/dL (ref 6–23)
CO2: 24 mEq/L (ref 19–32)
CREATININE: 0.97 mg/dL (ref 0.40–1.50)
Calcium: 9.4 mg/dL (ref 8.4–10.5)
Chloride: 104 mEq/L (ref 96–112)
GFR: 100.4 mL/min (ref >60.00)
GLUCOSE: 93 mg/dL (ref 70–99)
Potassium: 3.9 mEq/L (ref 3.5–5.1)
SODIUM: 136 meq/L (ref 135–145)
TOTAL PROTEIN: 7.8 g/dL (ref 6.0–8.3)

## 2015-10-26 MED ORDER — ALBUTEROL SULFATE (2.5 MG/3ML) 0.083% IN NEBU
2.5000 mg | INHALATION_SOLUTION | Freq: Once | RESPIRATORY_TRACT | Status: AC
  Administered 2015-10-26: 2.5 mg via RESPIRATORY_TRACT

## 2015-10-26 MED ORDER — AZITHROMYCIN 250 MG PO TABS: ORAL_TABLET | ORAL | 0 refills | Status: DC

## 2015-10-26 MED ORDER — ALBUTEROL SULFATE HFA 108 (90 BASE) MCG/ACT IN AERS: 2.0000 | INHALATION_SPRAY | Freq: Four times a day (QID) | RESPIRATORY_TRACT | 0 refills | Status: DC | PRN

## 2015-10-26 NOTE — Progress Notes (Signed)
Pre visit review using our clinic review tool, if applicable. No additional management support is needed unless otherwise documented below in the visit note. 

## 2015-10-26 NOTE — Progress Notes (Signed)
Tommi Rumps, MD Phone: [phone]  Michael [name] is a 25 y.o. male who presents today for same-day visit.  Patient notes onset of sore throat last Thursday. Then developed upper respiratory congestion and ear pressure followed by cough. Notes he is blowing yellow mucus out of his nose. Cough is productive of yellow mucus as well. Some wheezing. No shortness of breath. No fevers. No sick contacts. He does report he inhaled a small amount of battery corrosive at work last Wednesday and has had no issues with shortness of breath since that time. No blood in his sputum. Notes a history of bronchitis in the past.  PMH: Smoker   ROS see history of present illness  Objective  Physical Exam Vitals:   10/26/15 1025  BP: 112/74  Pulse: 97  Temp: 98.6 F (37 C)    BP Readings from Last 3 Encounters:  10/26/15 112/74  10/18/15 108/76  08/16/15 (!) 147/85   Wt Readings from Last 3 Encounters:  10/26/15 205 lb (93 kg)  10/18/15 198 lb 12.8 oz (90.2 kg)  08/16/15 200 lb (90.7 kg)    Physical Exam  Constitutional: No distress.  HENT:  Head: Normocephalic and atraumatic.  Mouth/Throat: Oropharynx is clear and moist. No oropharyngeal exudate.  Normal TMs bilaterally  Eyes: Conjunctivae are normal. Pupils are equal, round, and reactive to light.  Neck: Neck supple.  Cardiovascular: Normal rate, regular rhythm and normal heart sounds.   Pulmonary/Chest: Effort normal. No respiratory distress.  Bilateral moderate expiratory wheezes with coarse breath sounds throughout, patient was given an albuterol nebulizer treatment and after the treatment his breath sounds were normal and he reported good benefit.  Musculoskeletal: He exhibits no edema.  Lymphadenopathy:    He has no cervical adenopathy.  Neurological: He is alert. Gait normal.  Skin: Skin is warm and dry. He is not diaphoretic.     Assessment/Plan: Please see individual problem list.  Bronchitis Patient's symptoms  most consistent with bronchitis. Potentially could be related to the inhalation that he described at work. Encouraged him to wear a mask at work. He does not appear to be in any respiratory distress. No compromise of his airway at this time. His oxygenation is in the normal range. His lung sounds improved with an albuterol nebulizer treatment. We will check basic lab work as outlined below. We'll obtain a chest x-ray. We will treat him at home with albuterol inhaler every 4-6 hours for the next 2 days and then as needed. Also treat with azithromycin to cover for bronchitis and potential underlying pneumonia. If he develops any airway compromise symptoms he will seek medical attention. He is given return precautions.   Orders Placed This Encounter  Procedures  . DG Chest 2 View    Standing Status:   Future    Number of Occurrences:   1    Standing Expiration Date:   12/25/2016    Order Specific Question:   Reason for Exam (SYMPTOM  OR DIAGNOSIS REQUIRED)    Answer:   productive cough, wheezing, possible inhalation of battery corosive material 5 days ago    Order Specific Question:   Preferred imaging location?    Answer:   [facility]  . CBC  . Comp Met (CMET)    Meds ordered this encounter  Medications  . albuterol (PROVENTIL) (2.5 MG/3ML) 0.083% nebulizer solution 2.5 mg  . albuterol (PROVENTIL HFA;VENTOLIN HFA) 108 (90 Base) MCG/ACT inhaler    Sig: Inhale 2 puffs into the lungs every  6 (six) hours as needed for wheezing or shortness of breath.    Dispense:  1 Inhaler    Refill:  0  . azithromycin (ZITHROMAX) 250 MG tablet    Sig: Please take 500 mg (2 tablets) by mouth today, then take 250 mg (1 tablet) by mouth daily    Dispense:  6 tablet    Refill:  0    Tommi Rumps, MD [place]

## 2015-10-26 NOTE — Patient Instructions (Signed)
Nice to see you. We are going to check some lab work and check a chest x-ray. We will treat you with azithromycin for possible bronchitis. We will provide you with albuterol inhaler as well. If you develop shortness of breath, cough productive of blood, fevers, or any new or changing symptoms please seek medical attention.

## 2015-10-26 NOTE — Assessment & Plan Note (Signed)
Patient's symptoms most consistent with bronchitis. Potentially could be related to the inhalation that he described at work. Encouraged him to wear a mask at work. He does not appear to be in any respiratory distress. No compromise of his airway at this time. His oxygenation is in the normal range. His lung sounds improved with an albuterol nebulizer treatment. We will check basic lab work as outlined below. We'll obtain a chest x-ray. We will treat him at home with albuterol inhaler every 4-6 hours for the next 2 days and then as needed. Also treat with azithromycin to cover for bronchitis and potential underlying pneumonia. If he develops any airway compromise symptoms he will seek medical attention. He is given return precautions.

## 2015-10-27 ENCOUNTER — Ambulatory Visit: Payer: Medicare Other | Admitting: Family Medicine

## 2015-11-19 ENCOUNTER — Ambulatory Visit (INDEPENDENT_AMBULATORY_CARE_PROVIDER_SITE_OTHER): Payer: Medicare Other | Admitting: Family Medicine

## 2015-11-19 ENCOUNTER — Encounter: Payer: Self-pay | Admitting: Family Medicine

## 2015-11-19 VITALS — BP 122/84 | HR 95 | Temp 98.5°F | Ht 67.75 in | Wt 208.1 lb

## 2015-11-19 DIAGNOSIS — R002 Palpitations: Secondary | ICD-10-CM | POA: Diagnosis not present

## 2015-11-19 DIAGNOSIS — R55 Syncope and collapse: Secondary | ICD-10-CM | POA: Insufficient documentation

## 2015-11-19 DIAGNOSIS — F250 Schizoaffective disorder, bipolar type: Secondary | ICD-10-CM | POA: Diagnosis not present

## 2015-11-19 DIAGNOSIS — K529 Noninfective gastroenteritis and colitis, unspecified: Secondary | ICD-10-CM | POA: Insufficient documentation

## 2015-11-19 DIAGNOSIS — Z23 Encounter for immunization: Secondary | ICD-10-CM

## 2015-11-19 DIAGNOSIS — F419 Anxiety disorder, unspecified: Secondary | ICD-10-CM | POA: Insufficient documentation

## 2015-11-19 LAB — COMPREHENSIVE METABOLIC PANEL
ALT: 44 U/L (ref 0–53)
AST: 16 U/L (ref 0–37)
Albumin: 4.3 g/dL (ref 3.5–5.2)
Alkaline Phosphatase: 78 U/L (ref 39–117)
BUN: 10 mg/dL (ref 6–23)
CHLORIDE: 106 meq/L (ref 96–112)
CO2: 29 meq/L (ref 19–32)
Calcium: 9.3 mg/dL (ref 8.4–10.5)
Creatinine, Ser: 0.95 mg/dL (ref 0.40–1.50)
GFR: 102.79 mL/min (ref >60.00)
GLUCOSE: 89 mg/dL (ref 70–99)
POTASSIUM: 4.6 meq/L (ref 3.5–5.1)
SODIUM: 139 meq/L (ref 135–145)
Total Bilirubin: 0.3 mg/dL (ref 0.2–1.2)
Total Protein: 7.2 g/dL (ref 6.0–8.3)

## 2015-11-19 LAB — TSH: TSH: 1.14 u[IU]/mL (ref 0.35–4.50)

## 2015-11-19 MED ORDER — BUSPIRONE HCL 7.5 MG PO TABS: 7.5000 mg | ORAL_TABLET | Freq: Two times a day (BID) | ORAL | 2 refills | Status: DC

## 2015-11-19 NOTE — Assessment & Plan Note (Signed)
Patient with significant anxiety. Initially I was going to treat with BuSpar though on review of care everywhere I noted that he had been seen by a psychiatrist at [facility] previously and he has a history of schizoaffective disorder. Given this I canceled the BuSpar prescription to the pharmacy and we will get the patient set up with psychiatrist for his mental health issues. He is given return precautions.

## 2015-11-19 NOTE — Patient Instructions (Signed)
Nice to see you. We are going to refer you to cardiology, gastroenterology, and psychiatry. We will order lab work as outlined below. If you develop thoughts of harming herself or others, chest pain, shortness of breath, palpitations, recurrent syncope, or any new or changing symptoms please seek medical attention immediately.

## 2015-11-19 NOTE — Progress Notes (Signed)
Tommi Rumps, MD Phone: [phone]  Michael [name] is a 25 y.o. male who presents today for follow-up.  Visit was supposed to be for physical exam though given the numerous positive issues on the review of systems this was changed to a follow-up visit.  Anxiety: Patient notes this has significantly worsened recently. He found out who his birth parents were and it was not a good experience. He has been significantly more anxious since then. He is snapping at people easier. Has had to take off of work due to this. No depression. No thoughts of hurting anybody else or himself. He does note some fatigue and difficulty sleeping with this. He does note some memory issues as well related to this with trouble remembering how he got from one place to another while driving. Does not want to do an SSRI or therapy. On review it appears that he has been diagnosed schizoaffective disorder previously.  Chronic diarrhea: Patient notes he's had diarrhea since having his gallbladder removed. He's been avoiding fried fatty foods and this has not helped. He has tried Imodium and this has not helped. No blood in his stool.  Syncope: Patient notes 1 week ago while at work he passed out. He went to stand up and got lightheaded and then passed out. No chest pain with this. He has had some symptoms of what sound to be PVCs with having a sudden thump in his chest and then he will be normal. No persistent palpitations. He has passed out in the past with panic attacks. This was not a panic attack per his report. He had no incontinence or seizure activity with this. He had no preceding palpitations.  PMH: Smoker   ROS   General:  Negative for nexplained weight loss, fever Skin: Negative for new or changing mole, sore that won't heal HEENT: Positive for trouble hearing, hoarseness, negative for trouble seeing, ringing in ears, mouth sores, dysphagia. CV:  Negative for chest pain, dyspnea, edema, palpitations Resp:  Positive for cough, negative for dyspnea, hemoptysis GI: Positive for diarrhea,Negative for nausea, vomiting, constipation, abdominal pain, melena, hematochezia. GU: Negative for dysuria, incontinence, urinary hesitance, hematuria, vaginal or penile discharge, polyuria, sexual difficulty, lumps in testicle or breasts MSK: Positive for muscle cramps or aches, negative for joint pain or swelling Neuro: Negative for headaches, weakness, numbness, dizziness, positive for passing out/fainting Psych: Negative for depression, positive for anxiety, memory problems   Objective  Physical Exam Vitals:   11/19/15 1058  BP: 122/84  Pulse: 95  Temp: 98.5 F (36.9 C)    BP Readings from Last 3 Encounters:  11/19/15 122/84  10/26/15 112/74  10/18/15 108/76   Wt Readings from Last 3 Encounters:  11/19/15 208 lb 2 oz (94.4 kg)  10/26/15 205 lb (93 kg)  10/18/15 198 lb 12.8 oz (90.2 kg)    Physical Exam  Constitutional: He is well-developed, well-nourished, and in no distress.  HENT:  Head: Normocephalic and atraumatic.  Mouth/Throat: Oropharynx is clear and moist. No oropharyngeal exudate.  Eyes: Conjunctivae are normal. Pupils are equal, round, and reactive to light.  Cardiovascular: Normal rate, regular rhythm and normal heart sounds.   Pulmonary/Chest: Effort normal and breath sounds normal.  Abdominal: Soft. Bowel sounds are normal. He exhibits no distension. There is no tenderness. There is no rebound and no guarding.  Musculoskeletal: He exhibits no edema.  Neurological: He is alert. Gait normal.  Skin: Skin is warm and dry.  Psychiatric:  Mood anxious, affect flat and tired-appearing  Assessment/Plan: Please see individual problem list.  Anxiety Patient with significant anxiety. Initially I was going to treat with BuSpar though on review of care everywhere I noted that he had been seen by a psychiatrist at [facility] previously and he has a history of schizoaffective disorder.  Given this I canceled the BuSpar prescription to the pharmacy and we will get the patient set up with psychiatrist for his mental health issues. He is given return precautions.  Chronic diarrhea Has been present since his gallbladder was removed. Likely related to this. We'll refer to GI for further management.  Syncope Suspect this is related to orthostatic hypotension versus vasovagal syncope. EKG attempted today to evaluate further though this is unable to be saved or printed. This was reviewed on the computer screen with no obvious ST or T-wave changes and appeared to be normal sinus rhythm with a rate of 72. Lab work as outlined below given report of thumping intermittently in his chest. We'll refer to cardiology for further evaluation.   Orders Placed This Encounter  Procedures  . Tdap vaccine greater than or equal to 7yo IM  . Flu Vaccine QUAD 36+ mos IM  . Comp Met (CMET)  . TSH  . Ambulatory referral to Gastroenterology    Referral Priority:   Routine    Referral Type:   Consultation    Referral Reason:   Specialty Services Required    Number of Visits Requested:   1  . Ambulatory referral to Cardiology    Referral Priority:   Routine    Referral Type:   Consultation    Referral Reason:   Specialty Services Required    Requested Specialty:   Cardiology    Number of Visits Requested:   1  . Ambulatory referral to Psychiatry    Referral Priority:   Routine    Referral Type:   Psychiatric    Referral Reason:   Specialty Services Required    Requested Specialty:   Psychiatry    Number of Visits Requested:   1  . EKG 12-Lead    Tommi Rumps, MD [place]

## 2015-11-19 NOTE — Assessment & Plan Note (Signed)
Has been present since his gallbladder was removed. Likely related to this. We'll refer to GI for further management.

## 2015-11-19 NOTE — Assessment & Plan Note (Signed)
Suspect this is related to orthostatic hypotension versus vasovagal syncope. EKG attempted today to evaluate further though this is unable to be saved or printed. This was reviewed on the computer screen with no obvious ST or T-wave changes and appeared to be normal sinus rhythm with a rate of 72. Lab work as outlined below given report of thumping intermittently in his chest. We'll refer to cardiology for further evaluation.

## 2015-11-30 ENCOUNTER — Ambulatory Visit: Payer: Medicare Other | Admitting: Internal Medicine

## 2015-11-30 ENCOUNTER — Encounter: Payer: Self-pay | Admitting: Nurse Practitioner

## 2015-11-30 ENCOUNTER — Ambulatory Visit (INDEPENDENT_AMBULATORY_CARE_PROVIDER_SITE_OTHER): Payer: Medicare Other | Admitting: Nurse Practitioner

## 2015-11-30 ENCOUNTER — Ambulatory Visit (INDEPENDENT_AMBULATORY_CARE_PROVIDER_SITE_OTHER): Payer: Medicare Other | Admitting: Internal Medicine

## 2015-11-30 ENCOUNTER — Other Ambulatory Visit (INDEPENDENT_AMBULATORY_CARE_PROVIDER_SITE_OTHER): Payer: Medicare Other

## 2015-11-30 ENCOUNTER — Encounter: Payer: Self-pay | Admitting: Internal Medicine

## 2015-11-30 ENCOUNTER — Encounter: Payer: Self-pay | Admitting: *Deleted

## 2015-11-30 VITALS — BP 118/78 | HR 92 | Ht 67.0 in | Wt 211.2 lb

## 2015-11-30 VITALS — BP 138/93 | HR 82 | Ht 67.0 in | Wt 213.0 lb

## 2015-11-30 DIAGNOSIS — K219 Gastro-esophageal reflux disease without esophagitis: Secondary | ICD-10-CM | POA: Diagnosis not present

## 2015-11-30 DIAGNOSIS — Z7689 Persons encountering health services in other specified circumstances: Secondary | ICD-10-CM

## 2015-11-30 DIAGNOSIS — R002 Palpitations: Secondary | ICD-10-CM | POA: Diagnosis not present

## 2015-11-30 DIAGNOSIS — R197 Diarrhea, unspecified: Secondary | ICD-10-CM | POA: Diagnosis not present

## 2015-11-30 DIAGNOSIS — R55 Syncope and collapse: Secondary | ICD-10-CM

## 2015-11-30 LAB — IGA: IGA: 182 mg/dL (ref 68–378)

## 2015-11-30 MED ORDER — RANITIDINE HCL 150 MG PO TABS: 150.0000 mg | ORAL_TABLET | Freq: Two times a day (BID) | ORAL | 1 refills | Status: DC

## 2015-11-30 MED ORDER — CHOLESTYRAMINE 4 G PO PACK: 4.0000 g | PACK | Freq: Three times a day (TID) | ORAL | 1 refills | Status: DC

## 2015-11-30 NOTE — Patient Instructions (Addendum)
DO NOT DRIVE.    Testing/Procedures: Your physician has recommended that you wear an event monitor. Event monitors are medical devices that record the heart's electrical activity. Doctors most often [place] these monitors to diagnose arrhythmias. Arrhythmias are problems with the speed or rhythm of the heartbeat. The monitor is a small, portable device. You can wear one while you do your normal daily activities. This is usually used to diagnose what is causing palpitations/syncope (passing out).  Your physician has requested that you have an echocardiogram. Echocardiography is a painless test that uses sound waves to create images of your heart. It provides your doctor with information about the size and shape of your heart and how well your heart's chambers and valves are working. This procedure takes approximately one hour. There are no restrictions for this procedure.   Follow-Up: Your physician recommends that you schedule a follow-up appointment in: 1 month with Dr. Saunders Revel.  It was a pleasure seeing you today here in the office. Please do not hesitate to give [place] a call back if you have any further questions. [place], BSN      Cardiac Event Monitoring A cardiac event monitor is a small recording device used to help detect abnormal heart rhythms (arrhythmias). The monitor is used to record heart rhythm when noticeable symptoms such as the following occur:  Fast heartbeats (palpitations), such as heart racing or fluttering.  Dizziness.  Fainting or light-headedness.  Unexplained weakness. The monitor is wired to two electrodes placed on your chest. Electrodes are flat, sticky disks that attach to your skin. The monitor can be worn for up to 30 days. You will wear the monitor at all times, except when bathing.  HOW TO USE YOUR CARDIAC EVENT MONITOR A technician will prepare your chest for the electrode placement. The technician will show you how to place the electrodes,  how to work the monitor, and how to replace the batteries. Take time to practice using the monitor before you leave the office. Make sure you understand how to send the information from the monitor to your health care provider. This requires a telephone with a landline, not a cell phone. You need to:  Wear your monitor at all times, except when you are in water:  Do not get the monitor wet.  Take the monitor off when bathing. Do not swim or use a hot tub with it on.  Keep your skin clean. Do not put body lotion or moisturizer on your chest.  Change the electrodes daily or any time they stop sticking to your skin. You might need to use tape to keep them on.  It is possible that your skin under the electrodes could become irritated. To keep this from happening, try to put the electrodes in slightly different places on your chest. However, they must remain in the area under your left breast and in the upper right section of your chest.  Make sure the monitor is safely clipped to your clothing or in a location close to your body that your health care provider recommends.  Press the button to record when you feel symptoms of heart trouble, such as dizziness, weakness, light-headedness, palpitations, thumping, shortness of breath, unexplained weakness, or a fluttering or racing heart. The monitor is always on and records what happened slightly before you pressed the button, so do not worry about being too late to get good information.  Keep a diary of your activities, such as walking, doing chores, and  taking medicine. It is especially important to note what you were doing when you pushed the button to record your symptoms. This will help your health care provider determine what might be contributing to your symptoms. The information stored in your monitor will be reviewed by your health care provider alongside your diary entries.  Send the recorded information as recommended by your health care  provider. It is important to understand that it will take some time for your health care provider to process the results.  Change the batteries as recommended by your health care provider. SEEK IMMEDIATE MEDICAL CARE IF:   You have chest pain.  You have extreme difficulty breathing or shortness of breath.  You develop a very fast heartbeat that persists.  You develop dizziness that does not go away.  You faint or constantly feel you are about to faint.   This information is not intended to replace advice given to you by your health care provider. Make sure you discuss any questions you have with your health care provider.   Document Released: 11/16/2007 Document Revised: 02/27/2014 Document Reviewed: 08/05/2012 Elsevier Interactive Patient Education [employer]. Echocardiogram An echocardiogram, or echocardiography, uses sound waves (ultrasound) to produce an image of your heart. The echocardiogram is simple, painless, obtained within a short period of time, and offers valuable information to your health care provider. The images from an echocardiogram can provide information such as:  Evidence of coronary artery disease (CAD).  Heart size.  Heart muscle function.  Heart valve function.  Aneurysm detection.  Evidence of a past heart attack.  Fluid buildup around the heart.  Heart muscle thickening.  Assess heart valve function. LET [facility] CARE PROVIDER KNOW ABOUT:  Any allergies you have.  All medicines you are taking, including vitamins, herbs, eye drops, creams, and over-the-counter medicines.  Previous problems you or members of your family have had with the use of anesthetics.  Any blood disorders you have.  Previous surgeries you have had.  Medical conditions you have.  Possibility of pregnancy, if this applies. BEFORE THE PROCEDURE  No special preparation is needed. Eat and drink normally.  PROCEDURE   In order to produce an image of your  heart, gel will be applied to your chest and a wand-like tool (transducer) will be moved over your chest. The gel will help transmit the sound waves from the transducer. The sound waves will harmlessly bounce off your heart to allow the heart images to be captured in real-time motion. These images will then be recorded.  You may need an IV to receive a medicine that improves the quality of the pictures. AFTER THE PROCEDURE You may return to your normal schedule including diet, activities, and medicines, unless your health care provider tells you otherwise.   This information is not intended to replace advice given to you by your health care provider. Make sure you discuss any questions you have with your health care provider.   Document Released: 02/04/2000 Document Revised: 02/27/2014 Document Reviewed: 10/14/2012 Elsevier Interactive Patient Education [employer].

## 2015-11-30 NOTE — Progress Notes (Signed)
New Outpatient Visit Date: 11/30/2015  Referring Provider: [name], MD [address] [place], [place] [address]  Chief Complaint: dizziness  HPI:  Michael [name] is a 25 y.o. year-old male with history of GERD, depression/anxiety, and polysubstance abuse, who has been referred by Dr. Caryl Bis for evaluation of syncope.  The patient reports several episodes of dizziness over the last 1.5 months.  He first notes a "thump" in his chest followed by lightheadedness ~2-3 minutes later.  The lightheadedness lasts for ~2 minutes before resolving spontaneously.  He had one episode during which he passed out for about 10 seconds.  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  He notes that he had been sweating quite a bit that day and had not been drinking much.  He is also concerned that the "harsh chemical" around him that day may have contributed to the episode.  The patient also notes occasional tightness in his chest radiating to the neck and left arm.  The pain sometimes accompanies the lightheadedness.  The maximal pain intensity is 4/10, with the pain resolving on its own over the course of a few minutes.  The pain is not exertional.  Mr. Tarpey at times also notes a "nervous" sensation with racing of the heart and accompanying shortness of breath and diaphoresis.  This typically lasts a few minutes.  The aforementioned symptoms began around the time that Mr. Libertini discovered the identity of his biologic parents.  Since that time, he has been under considerable stress.  He reports using numerous drugs (see SH below) up until 3 years ago.  Of note, pre-cholecystectomy H&P in 07/2014 indicates more recent cocaine use.  The patient rarely consumes EtOH but has increased smoking up to 2 PPD over the last few weeks.  He drinks 3 cups of coffee per day as well as at least one caffeinated soda.  The patient became lightheaded in the office today while orthostatic  vital signs were being performed.  He did not pass out.  HR and BP were normal at the time.  --------------------------------------------------------------------------------------------------  Cardiovascular History & Procedures: Cardiovascular Problems:  Syncope  Palpitations  Chest pain  Risk Factors:  None  Cath/PCI:  None  CV Surgery:  None  EP Procedures and Devices:  None  Non-Invasive Evaluation(s):  None  Recent CV Pertinent Labs: Lab Results  Component Value Date   INR 0.9 01/19/2012   K 4.6 11/19/2015   K 4.0 08/13/2013   BUN 10 11/19/2015   BUN 9 08/13/2013   CREATININE 0.95 11/19/2015   CREATININE 1.30 08/13/2013    --------------------------------------------------------------------------------------------------  Past Medical History:  Diagnosis Date  . Allergy   . Anxiety   . Asthma   . Chest pain last few months  . Depression   . GERD (gastroesophageal reflux disease)   . Pancreatitis last 2015  . Substance abuse     Past Surgical History:  Procedure Laterality Date  . CHOLECYSTECTOMY  08/16/2015   Procedure: LAPAROSCOPIC CHOLECYSTECTOMY;  Surgeon: Alphonsa Overall, MD;  Location: [facility]S;  Service: General;;  . TONSILLECTOMY AND ADENOIDECTOMY  as child   and adenoids    Current Outpatient Prescriptions on File Prior to Visit  Medication Sig Dispense Refill  . albuterol (PROVENTIL HFA;VENTOLIN HFA) 108 (90 Base) MCG/ACT inhaler Inhale 2 puffs into the lungs every 6 (six) hours as needed for wheezing or shortness of breath. 1 Inhaler 0   No current facility-administered  medications on file prior to visit.    Allergies: Bee venom; Quetiapine; Serotonin reuptake inhibitors (ssris); Gabapentin; Lurasidone; Prednisone; and Latuda [lurasidone hcl]  Social History   Social History  . Marital status: Single    Spouse name: N/A  . Number of children: 2  . Years of education: N/A   Occupational History  . Not on file.   Social  History Main Topics  . Smoking status: Current Every Day Smoker    Packs/day: 1.00    Types: Cigarettes  . Smokeless tobacco: Former Systems developer  . Alcohol use No  . Drug use:      Comment: marijuana occasional  . Sexual activity: Yes    Partners: Female   Other Topics Concern  . Not on file   Social History Narrative   Lives at home with parents.      Work - none at present, has worked Lawyer - Completed high school      Hobbies - computers, outdoors, fishing      Diet - regular      Exercise - none    Family History  Problem Relation Age of Onset  . Adopted: Yes  . Cancer Paternal Grandfather     lung  . Diabetes Paternal Grandfather   . Ulcers Father     Review of Systems: A 12-system review of systems was performed and was negative except as noted in the HPI.  --------------------------------------------------------------------------------------------------  Physical Exam: BP (!) 138/93 (BP Location: Left Arm, Patient Position: Sitting, Cuff Size: Normal)   Pulse 82   Ht 5\' 7"  (1.702 m)   Wt 213 lb (96.6 kg)   SpO2 97%   BMI 33.36 kg/m    Orthostatic VS: Position BP HR  Lying 134/86 80  Sitting 135/79 84  Standing 146/99 90  Standing (3 minutes) 147/103 86   General:  Overweight young man, seated in the exam room.  He appears slightly anxious. HEENT: No conjunctival pallor or scleral icterus.  Moist mucous membranes.  OP clear. Neck: Supple without lymphadenopathy, thyromegaly, JVD, or HJR.  No carotid bruit. Lungs: Normal work of breathing.  Clear to auscultation bilaterally without wheezes or crackles. Heart: Regular rate and rhythm without murmurs, rubs, or gallops.  Non-displaced PMI. Abd: Bowel sounds present.  Soft, NT/ND without hepatosplenomegaly Ext: No lower extremity edema.  Radial, PT, and DP pulses are 2+ bilaterally Skin: warm and dry without rash Neuro: CNIII-XII intact.  Strength and fine-touch sensation intact in  upper and lower extremities bilaterally. Psych: Slightly anxious.  EKG:  Normal sinus rhythm without significant abnormalities.  Lab Results  Component Value Date   WBC 8.7 10/26/2015   HGB 16.5 10/26/2015   HCT 47.4 10/26/2015   MCV 90.7 10/26/2015   PLT 204.0 10/26/2015    Lab Results  Component Value Date   NA 139 11/19/2015   K 4.6 11/19/2015   CL 106 11/19/2015   CO2 29 11/19/2015   BUN 10 11/19/2015   CREATININE 0.95 11/19/2015   GLUCOSE 89 11/19/2015   ALT 44 11/19/2015    --------------------------------------------------------------------------------------------------  ASSESSMENT AND PLAN: 1. Syncope and palpitations Mr. Manzoor describes several distinct symptoms, including a "thump," racing of the heart, and non-exertional chest pain.  His exam and EKG are unremarkable today, though he did reports considerable lightheadedness while standing for orthostatic vital signs.  BP and heart rate at the time were normal and not significantly different than his lying and sitting values.  I suspect that stress may be playing an important role in his symptoms.  However, it is reasonable to exclude underlying cardiac etiology.  We will therefore obtain a 14-day event monitor and transthoracic echocardiogram.  I have advised the patient to stop (or at least cut down) on his caffeine consumption.  I also encouraged him to stop smoking and to continue avoiding drugs.  Mr. Custalow is scheduled to see a therapist later this week for further evaluation of his anxiety/depression; I strongly encouraged him to keep this appointment.  I doubt that Mr. Gerena chest pain reflects obstructive CAD, given his young age and its non-exertional quality.  We will defer ischemia evaluation pending the aforementioned echo and event monitor.  The patient was advised to refrain from driving and operating heavy machinery pending our workup.  Follow-up: Return to clinic in 1 month.  Nelva Bush,  MD 11/30/2015 11:31 AM

## 2015-11-30 NOTE — Patient Instructions (Addendum)
Please go to the basement level to have your labs drawn.  We sent a prescription for Zantac 150 mg and Questran to [facility].  If the Lucrezia Starch makes you constipated hold it for awhile and then restart once daily.  Jerrye Bushy literature provided.

## 2015-11-30 NOTE — Progress Notes (Signed)
     HPI: This is a 25 year old male known to Dr. Ardis Hughs. He was evaluated here in May for right upper quadrant pain as well as abnormal rectal findings on the CT scan. Colonoscopy was done, no rectal abnormalities but several polyps were removed. Multiple polyps were found in the terminal ileum.. Right upper quadrant pain ultimately found to be due to gallbladder disease. Patient underwent a cholecystectomy in late June and gallbladder pathology compatible with chronic cholecystitis and cholelithiasis.  Patient is here for evaluation of loose stools. Her to becoming ill with gallbladder problems last year patient had normal solid stools. Months proceeding cholecystectomy his stools became less solid. Following cholecystectomy stools became watery occurring 3-4 times a day. Bowel movements are random times, some nocturnal. No solid bowel movements since cholecystectomy. Stools smell like acid. Patient has tried Imodium one tablet every day but that hasn't helped. He does get some lower abdominal cramps BMs which usually subside within 10 minutes after bowel movement. She not had any weight loss and diabetes gained 20 pounds since cholecystectomy.  Patient complains of heartburn. He tried Protonix for month following cholecystectomy but it was not helpful. Daily Zantac has not helped very much either. Heartburn is not related to eating, he randomly wakes up during the night with it which is interfering with sleep.   Past Medical History:  Diagnosis Date  . Allergy   . Anxiety   . Asthma   . Chest pain last few months  . Depression   . GERD (gastroesophageal reflux disease)   . Pancreatitis last 2015  . Substance abuse     Patient's surgical history, family medical history, social history, and allergies were all reviewed in Epic    Physical Exam: BP 118/78   Pulse 92   Ht 5\' 7"  (1.702 m)   Wt 211 lb 3.2 oz (95.8 kg)   BMI 33.08 kg/m   GENERAL: Pleasant white male in NAD PSYCH:  :Pleasant, cooperative, normal affect HEENT: Normocephalic, conjunctiva pink, mucous membranes moist, neck supple without masses CARDIAC:  RRR, no murmur heard, no peripheral edema PULM: Normal respiratory effort, lungs CTA bilaterally, no wheezing ABDOMEN:  soft, nontender, nondistended, no obvious masses, no hepatomegaly,  normal bowel sounds SKIN:  turgor, no lesions seen NEURO: Alert and oriented x 3, no focal neurologic deficits  ASSESSMENT and PLAN:  29. 25 year old male with chronic loose stool. Stool consistency changed a few months prior to cholecystectomy when patient was being worked up for abdominal pain. Following cholecystectomy stools became even more watery and frequent occurring at to 4 times a day at random times. He has associated lower abdominal cramping which usually resolves within 10 minutes after defecation. Patient has IBS or maybe all related to bile acid.  -Trial of cholestyramine, 3 times a day for now -TTG, IgA though celiac seems less likely   2. GERD, breakthrough heartburn on daily Zantac. PPI was not helpful -Increase Zantac to twice a day -GERD literature given

## 2015-12-01 LAB — TISSUE TRANSGLUTAMINASE, IGG: Tissue Transglut Ab: 1 U/mL (ref <6)

## 2015-12-02 NOTE — Progress Notes (Signed)
I agree with the above note, plan 

## 2015-12-04 ENCOUNTER — Ambulatory Visit (INDEPENDENT_AMBULATORY_CARE_PROVIDER_SITE_OTHER): Payer: Medicare Other

## 2015-12-04 DIAGNOSIS — R55 Syncope and collapse: Secondary | ICD-10-CM | POA: Diagnosis not present

## 2015-12-21 ENCOUNTER — Other Ambulatory Visit: Payer: Self-pay

## 2015-12-21 ENCOUNTER — Ambulatory Visit (INDEPENDENT_AMBULATORY_CARE_PROVIDER_SITE_OTHER): Payer: Medicare Other

## 2015-12-21 DIAGNOSIS — R55 Syncope and collapse: Secondary | ICD-10-CM | POA: Diagnosis not present
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
## 2016-01-04 ENCOUNTER — Encounter: Payer: Self-pay | Admitting: Internal Medicine

## 2016-01-04 ENCOUNTER — Ambulatory Visit (INDEPENDENT_AMBULATORY_CARE_PROVIDER_SITE_OTHER): Payer: Medicare Other | Admitting: Internal Medicine

## 2016-01-04 VITALS — BP 140/100 | HR 96 | Ht 67.0 in | Wt 217.8 lb

## 2016-01-04 DIAGNOSIS — R079 Chest pain, unspecified: Secondary | ICD-10-CM

## 2016-01-04 DIAGNOSIS — R030 Elevated blood-pressure reading, without diagnosis of hypertension: Secondary | ICD-10-CM | POA: Diagnosis not present

## 2016-01-04 DIAGNOSIS — R0602 Shortness of breath: Secondary | ICD-10-CM | POA: Diagnosis not present

## 2016-01-04 DIAGNOSIS — Z720 Tobacco use: Secondary | ICD-10-CM | POA: Diagnosis not present

## 2016-01-04 DIAGNOSIS — R55 Syncope and collapse: Secondary | ICD-10-CM

## 2016-01-04 MED ORDER — METOPROLOL TARTRATE 25 MG PO TABS: 25.0000 mg | ORAL_TABLET | Freq: Two times a day (BID) | ORAL | 3 refills | Status: DC

## 2016-01-04 NOTE — Patient Instructions (Addendum)
Medication Instructions:  Your physician has recommended you make the following change in your medication:  1. START Metoprolol 25 mg Twice a day.   Testing/Procedures: Your physician has requested that you have an exercise tolerance test. For further information please visit [email or web address]. Please also follow instruction sheet, as given.   Do not drink or eat foods with caffeine for 24 hours before the test. (Chocolate, coffee, tea, or energy drinks)  If you use an inhaler, bring it with you to the test.  Do not smoke for 4 hours before the test.  Wear comfortable shoes and clothing.  Follow-Up: Your physician recommends that you schedule a follow-up appointment in: 4 weeks with Dr. Saunders Revel.  DO NOT DRIVE!!  It was a pleasure seeing you today here in the office. Please do not hesitate to give [place] a call back if you have any further questions. [place], BSN      Exercise Stress Electrocardiogram, Care After These instructions give you information on caring for yourself after your procedure. Your doctor may also give you more specific instructions. Call your doctor if you have any problems or questions after your procedure. Follow these instructions at home:  You may return to your normal diet, activities, and medicines or as told by your doctor.  Follow-up with your doctor as told. Contact a doctor if:  You keep feeling dizzy or lightheaded.  You get more dizzy or lightheaded.  You feel like your heart is beating fast.  You feel like your heart is not beating in a normal pattern.  You keep feeling sick to your stomach or throwing up. Get help right away if:  You have pain or pressure in any of these areas:  Your chest.  Your jaw or neck.  Between your shoulder blades.  Down your left arm.  You pass out.  You have trouble breathing. This information is not intended to replace advice given to you by your health care provider. Make sure you  discuss any questions you have with your health care provider. Document Released: 11/27/2012 Document Revised: 07/15/2015 Document Reviewed: 10/14/2012 Elsevier Interactive Patient Education  2017 [employer].

## 2016-01-18 ENCOUNTER — Telehealth: Payer: Self-pay | Admitting: Internal Medicine

## 2016-01-18 NOTE — Telephone Encounter (Signed)
Pt scheduled for 11/29 GXT.  Left message on pt home VM as pt is to hold metoprolol in the morning. Provided CB number.

## 2016-01-19 ENCOUNTER — Ambulatory Visit (INDEPENDENT_AMBULATORY_CARE_PROVIDER_SITE_OTHER): Payer: Medicare Other

## 2016-01-19 DIAGNOSIS — R55 Syncope and collapse: Secondary | ICD-10-CM

## 2016-01-19 LAB — EXERCISE TOLERANCE TEST
CHL CUP MPHR: 195 {beats}/min
CSEPHR: 87 %
CSEPPHR: 171 {beats}/min
Estimated workload: 11.9 METS
Exercise duration (min): 10 min
Exercise duration (sec): 2 s
Rest HR: 101 {beats}/min

## 2016-01-26 ENCOUNTER — Ambulatory Visit (INDEPENDENT_AMBULATORY_CARE_PROVIDER_SITE_OTHER): Payer: Medicare Other | Admitting: Internal Medicine

## 2016-01-26 ENCOUNTER — Encounter: Payer: Self-pay | Admitting: Internal Medicine

## 2016-01-26 VITALS — BP 136/96 | HR 83 | Ht 67.0 in | Wt 222.0 lb

## 2016-01-26 DIAGNOSIS — R002 Palpitations: Secondary | ICD-10-CM | POA: Diagnosis not present

## 2016-01-26 DIAGNOSIS — I10 Essential (primary) hypertension: Secondary | ICD-10-CM | POA: Diagnosis not present

## 2016-01-26 DIAGNOSIS — R55 Syncope and collapse: Secondary | ICD-10-CM | POA: Diagnosis not present

## 2016-01-26 DIAGNOSIS — R079 Chest pain, unspecified: Secondary | ICD-10-CM | POA: Diagnosis not present

## 2016-01-26 DIAGNOSIS — R0789 Other chest pain: Secondary | ICD-10-CM | POA: Insufficient documentation

## 2016-01-26 MED ORDER — METOPROLOL TARTRATE 50 MG PO TABS: 50.0000 mg | ORAL_TABLET | Freq: Two times a day (BID) | ORAL | 3 refills | Status: DC
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
## 2016-01-26 NOTE — Patient Instructions (Signed)
Medication Instructions:  Your physician has recommended you make the following change in your medication:  INCREASE metoprolol to 50mg  twice daily   Labwork: none  Testing/Procedures: Coronary CTA at [facility] will receive a call from scheduling to set up test.    Follow-Up: Your physician recommends that you schedule a follow-up appointment in 3 months with Dr. Saunders Revel.   Any Other Special Instructions Will Be Listed Below (If Applicable).     If you need a refill on your cardiac medications before your next appointment, please call your pharmacy.

## 2016-02-01 ENCOUNTER — Ambulatory Visit: Payer: Medicare Other | Admitting: Gastroenterology

## 2016-02-07 ENCOUNTER — Telehealth: Payer: Self-pay | Admitting: Internal Medicine

## 2016-02-07 NOTE — Telephone Encounter (Signed)
02-07-16 LVMOM to call and schedule Cardiac Ct

## 2016-02-17 ENCOUNTER — Encounter: Payer: Self-pay | Admitting: Internal Medicine

## 2016-02-23 ENCOUNTER — Telehealth: Payer: Self-pay | Admitting: Family Medicine

## 2016-02-23 NOTE — Telephone Encounter (Signed)
Pt Mom called and stated that pt needs a new referral for Dr. Nicolasa Ducking, please advise, thank you!  Call Mom @ [phone]

## 2016-03-03 ENCOUNTER — Encounter (HOSPITAL_COMMUNITY): Payer: Self-pay

## 2016-03-03 ENCOUNTER — Ambulatory Visit (HOSPITAL_COMMUNITY)
Admission: RE | Admit: 2016-03-03 | Discharge: 2016-03-03 | Disposition: A | Discharge status: Home / Self Care | Payer: Medicare Other | Source: Ambulatory Visit | Attending: Internal Medicine | Admitting: Internal Medicine

## 2016-03-03 DIAGNOSIS — R079 Chest pain, unspecified: Secondary | ICD-10-CM | POA: Insufficient documentation

## 2016-03-03 MED ORDER — METOPROLOL TARTRATE 5 MG/5ML IV SOLN
5.0000 mg | INTRAVENOUS | Status: AC | PRN
  Administered 2016-03-03 (×5): 5 mg via INTRAVENOUS

## 2016-03-03 MED ORDER — METOPROLOL TARTRATE 5 MG/5ML IV SOLN
INTRAVENOUS | Status: AC
  Administered 2016-03-03: 5 mg via INTRAVENOUS
  Filled 2016-03-03: qty 20

## 2016-03-03 MED ORDER — NITROGLYCERIN 0.4 MG SL SUBL
SUBLINGUAL_TABLET | SUBLINGUAL | Status: AC
  Administered 2016-03-03: 0.8 mg via SUBLINGUAL
  Filled 2016-03-03: qty 2

## 2016-03-03 MED ORDER — METOPROLOL TARTRATE 5 MG/5ML IV SOLN
INTRAVENOUS | Status: AC
  Administered 2016-03-03: 5 mg via INTRAVENOUS
  Filled 2016-03-03: qty 5

## 2016-03-03 MED ORDER — IOPAMIDOL (ISOVUE-370) INJECTION 76%
INTRAVENOUS | Status: AC
  Administered 2016-03-03: 80 mL
  Filled 2016-03-03: qty 100

## 2016-03-03 MED ORDER — NITROGLYCERIN 0.4 MG SL SUBL
0.8000 mg | SUBLINGUAL_TABLET | Freq: Once | SUBLINGUAL | Status: AC
  Administered 2016-03-03: 0.8 mg via SUBLINGUAL

## 2016-03-13 ENCOUNTER — Ambulatory Visit (INDEPENDENT_AMBULATORY_CARE_PROVIDER_SITE_OTHER): Payer: Medicare Other | Admitting: Family Medicine

## 2016-03-13 ENCOUNTER — Encounter: Payer: Self-pay | Admitting: Family Medicine

## 2016-03-13 DIAGNOSIS — F419 Anxiety disorder, unspecified: Secondary | ICD-10-CM

## 2016-03-13 DIAGNOSIS — K529 Noninfective gastroenteritis and colitis, unspecified: Secondary | ICD-10-CM | POA: Diagnosis not present

## 2016-03-13 DIAGNOSIS — R002 Palpitations: Secondary | ICD-10-CM | POA: Diagnosis not present

## 2016-03-13 DIAGNOSIS — K635 Polyp of colon: Secondary | ICD-10-CM | POA: Insufficient documentation

## 2016-03-13 NOTE — Assessment & Plan Note (Signed)
Per phone note from GI these were potentially precancerous. Advised recall in 5 years. This is already in the health maintenance tab.

## 2016-03-13 NOTE — Patient Instructions (Signed)
Nice to see you. Please complete the stool cards. Please continue the cholestyramine packets. If you have recurrence of bleeding or abdominal pain please seek medical attention immediately. If your anxiety gets worse let us know. If you develop depression, or recurrence of palpitations or feels that you're going to pass out please seek medical attention medially.

## 2016-03-13 NOTE — Assessment & Plan Note (Signed)
Has been evaluated by GI. He was placed on cholestyramine packets. These have been beneficial. He does report a small streak of blood when he was constipated. He declined rectal exam today. We'll provide him with stool cards to determine if any further workup needs to be done after stool cards return. He will continue the cholestyramine packets. Given return precautions.

## 2016-03-13 NOTE — Assessment & Plan Note (Signed)
Patient with significant anxiety. No depression at this time. No SI or HI. I discussed having him continue to try to get in to see a psychiatrist locally and advised that he needs to contact them to try to get an appointment given his psychiatric history. I advised that if he ever develops SI or HI he needs to seek medical attention immediately in the emergency room. He voiced understanding. Given return precautions.

## 2016-03-13 NOTE — Progress Notes (Signed)
Pre visit review using our clinic review tool, if applicable. No additional management support is needed unless otherwise documented below in the visit note. 

## 2016-03-13 NOTE — Progress Notes (Signed)
  Tommi Rumps, MD Phone: [phone]  Michael [name] is a 26 y.o. male who presents today for follow-up.  Patient presents with issues of diarrhea previously following gallbladder removal. He saw GI and they placed him on cholestyramine packets which have been beneficial. At one point he did take too many of them and did have some constipation though this resolved. When he had the constipation he had a small streak of blood though has had no bleeding since then. No abdominal pain with this. No diarrhea at this time. Also had colon polyps previously on colonoscopy.  He notes his palpitations have improved and he has not had any syncopal episodes since going on metoprolol. He had quite an extensive workup through cardiology and had a negative coronary CT scan.  He does note some increased stress and anxiety recently. Notes he feels as though he is bouncing all over with this. He denies depression. He notes no SI or HI. He does get angry and get in fights at times though has no suicidal ideation or homicidal ideation. No intent or plan to harm himself or others. He has been trying to get into see the psychiatrist though he has had to work through his insurance with this.  PMH: Smoker   ROS see history of present illness  Objective  Physical Exam Vitals:   03/13/16 1559  BP: 120/82  Pulse: (!) 103  Temp: 98.1 F (36.7 C)    BP Readings from Last 3 Encounters:  03/13/16 120/82  03/03/16 (!) 142/95  01/26/16 (!) 136/96   Wt Readings from Last 3 Encounters:  03/13/16 222 lb 9.6 oz (101 kg)  03/03/16 217 lb (98.4 kg)  01/26/16 222 lb (100.7 kg)    Physical Exam  Constitutional: No distress.  HENT:  Head: Normocephalic and atraumatic.  Cardiovascular: Normal rate, regular rhythm and normal heart sounds.   Pulmonary/Chest: Effort normal and breath sounds normal.  Abdominal: Soft. Bowel sounds are normal. He exhibits no distension. There is no tenderness. There is no rebound  and no guarding.  Genitourinary:  Genitourinary Comments: Patient declined rectal exam  Neurological: He is alert. Gait normal.  Skin: Skin is warm and dry. He is not diaphoretic.  Psychiatric:  Mood anxious, affect normal     Assessment/Plan: Please see individual problem list.  Palpitations Much improved now that he is on metoprolol. Had reassuring cardiac workup. Discussed continuing metoprolol and continue to monitor.  Anxiety Patient with significant anxiety. No depression at this time. No SI or HI. I discussed having him continue to try to get in to see a psychiatrist locally and advised that he needs to contact them to try to get an appointment given his psychiatric history. I advised that if he ever develops SI or HI he needs to seek medical attention immediately in the emergency room. He voiced understanding. Given return precautions.  Chronic diarrhea Has been evaluated by GI. He was placed on cholestyramine packets. These have been beneficial. He does report a small streak of blood when he was constipated. He declined rectal exam today. We'll provide him with stool cards to determine if any further workup needs to be done after stool cards return. He will continue the cholestyramine packets. Given return precautions.  Colon polyps Per phone note from GI these were potentially precancerous. Advised recall in 5 years. This is already in the health maintenance tab.   Tommi Rumps, MD [place]

## 2016-03-13 NOTE — Assessment & Plan Note (Signed)
Much improved now that he is on metoprolol. Had reassuring cardiac workup. Discussed continuing metoprolol and continue to monitor.

## 2016-04-26 ENCOUNTER — Ambulatory Visit: Payer: Medicare Other | Admitting: Internal Medicine

## 2016-06-22 ENCOUNTER — Telehealth: Payer: Self-pay

## 2016-06-22 ENCOUNTER — Encounter: Payer: Self-pay | Admitting: Family Medicine

## 2016-06-22 ENCOUNTER — Ambulatory Visit (INDEPENDENT_AMBULATORY_CARE_PROVIDER_SITE_OTHER): Payer: Medicare Other

## 2016-06-22 ENCOUNTER — Ambulatory Visit (INDEPENDENT_AMBULATORY_CARE_PROVIDER_SITE_OTHER): Payer: Medicare Other | Admitting: Family Medicine

## 2016-06-22 VITALS — BP 132/86 | HR 94 | Temp 98.4°F | Wt 221.0 lb

## 2016-06-22 DIAGNOSIS — R059 Cough, unspecified: Secondary | ICD-10-CM

## 2016-06-22 DIAGNOSIS — R05 Cough: Secondary | ICD-10-CM | POA: Diagnosis not present

## 2016-06-22 DIAGNOSIS — R1013 Epigastric pain: Secondary | ICD-10-CM

## 2016-06-22 LAB — COMPREHENSIVE METABOLIC PANEL
ALBUMIN: 4.6 g/dL (ref 3.5–5.2)
ALT: 28 U/L (ref 0–53)
AST: 17 U/L (ref 0–37)
Alkaline Phosphatase: 69 U/L (ref 39–117)
BUN: 11 mg/dL (ref 6–23)
CHLORIDE: 106 meq/L (ref 96–112)
CO2: 24 mEq/L (ref 19–32)
Calcium: 9.6 mg/dL (ref 8.4–10.5)
Creatinine, Ser: 0.93 mg/dL (ref 0.40–1.50)
GFR: 104.84 mL/min (ref >60.00)
Glucose, Bld: 103 mg/dL — ABNORMAL HIGH (ref 70–99)
POTASSIUM: 3.9 meq/L (ref 3.5–5.1)
SODIUM: 137 meq/L (ref 135–145)
Total Bilirubin: 0.4 mg/dL (ref 0.2–1.2)
Total Protein: 7.2 g/dL (ref 6.0–8.3)

## 2016-06-22 LAB — CBC
HEMATOCRIT: 45.8 % (ref 39.0–52.0)
HEMOGLOBIN: 15.7 g/dL (ref 13.0–17.0)
MCHC: 34.2 g/dL (ref 30.0–36.0)
MCV: 93.2 fl (ref 78.0–100.0)
Platelets: 217 10*3/uL (ref 150.0–400.0)
RBC: 4.91 Mil/uL (ref 4.22–5.81)
RDW: 13.1 % (ref 11.5–15.5)
WBC: 15.7 10*3/uL — AB (ref 4.0–10.5)

## 2016-06-22 LAB — LIPASE: Lipase: 10 U/L — ABNORMAL LOW (ref 11.0–59.0)

## 2016-06-22 MED ORDER — BENZONATATE 200 MG PO CAPS: 200.0000 mg | ORAL_CAPSULE | Freq: Two times a day (BID) | ORAL | 0 refills | Status: DC | PRN

## 2016-06-22 MED ORDER — ALBUTEROL SULFATE HFA 108 (90 BASE) MCG/ACT IN AERS: 2.0000 | INHALATION_SPRAY | Freq: Four times a day (QID) | RESPIRATORY_TRACT | 0 refills | Status: DC | PRN

## 2016-06-22 MED ORDER — AZITHROMYCIN 250 MG PO TABS: ORAL_TABLET | ORAL | 0 refills | Status: DC

## 2016-06-22 NOTE — Assessment & Plan Note (Signed)
Patient with a month and a half of cough and chest congestion with some sharp musculoskeletal chest discomfort following coughing. Suspect bronchitis. Will obtain a chest x-ray to rule out pneumonia. Once the chest x-ray returns we'll determine what antibiotic place him on. Tessalon for cough. Albuterol inhaler refilled. Given return precautions.

## 2016-06-22 NOTE — Addendum Note (Signed)
Addended by: Caryl Bis, Ariane Ditullio G on: 06/22/2016 12:00 PM   Modules accepted: Orders

## 2016-06-22 NOTE — Progress Notes (Addendum)
Tommi Rumps, MD Phone: [phone]  Michael [name] is a 26 y.o. male who presents today for same-day visit.  Patient notes over the last 1.5 months he has had cough productive of white/yellow mucus, wheezing, and some fatigue. He notes some achy and sharp central chest discomfort and some right-sided chest discomfort after he coughs. Lasts for about a minute and goes away on its own. He notes no exertional chest pain or exertional shortness of breath. He notes no fevers. He has a history of bronchitis in the past. He notes after coughing at times he gets epigastric abdominal spasm and right low rib cramping sensation. He notes some nausea and vomiting and some diarrhea with this. No blood in the vomit or diarrhea. No dysuria. No known sick contacts. He has not tried any medications for this.  PMH: Smoker   ROS see history of present illness  Objective  Physical Exam Vitals:   06/22/16 1032  BP: 132/86  Pulse: 94  Temp: 98.4 F (36.9 C)    BP Readings from Last 3 Encounters:  06/22/16 132/86  03/13/16 120/82  03/03/16 (!) 142/95   Wt Readings from Last 3 Encounters:  06/22/16 221 lb (100.2 kg)  03/13/16 222 lb 9.6 oz (101 kg)  03/03/16 217 lb (98.4 kg)    Physical Exam  Constitutional: No distress.  Cardiovascular: Normal rate, regular rhythm and normal heart sounds.   Pulmonary/Chest: Effort normal. No respiratory distress. He has no wheezes. He exhibits tenderness (positive for costochondral tenderness bilaterally, also right lower rib tenderness, no bony defects noted, no overlying bruising).  Mildly coarse breath sounds  Abdominal: Soft. Bowel sounds are normal. He exhibits no distension. There is tenderness (mild epigastric tenderness). There is no rebound and no guarding.  Neurological: He is alert. Gait normal.  Skin: Skin is warm and dry. He is not diaphoretic.     Assessment/Plan: Please see individual problem list.  Cough Patient with a month and a  half of cough and chest congestion with some sharp musculoskeletal chest discomfort following coughing. Suspect bronchitis. Will obtain a chest x-ray to rule out pneumonia. Once the chest x-ray returns we'll determine what antibiotic place him on. Tessalon for cough. Albuterol inhaler refilled. Given return precautions.  Abdominal pain Suspect muscular strain of his abdominal muscles and intercostal muscles related to his coughing. Given tenderness epigastrically we will check lab work to rule out other causes. Given return precautions.   Orders Placed This Encounter  Procedures  . DG Chest 2 View    Standing Status:   Future    Number of Occurrences:   1    Standing Expiration Date:   08/22/2017    Order Specific Question:   Reason for Exam (SYMPTOM  OR DIAGNOSIS REQUIRED)    Answer:   cough, chest congestion, costocondral and right lower rib tenderness    Order Specific Question:   Preferred imaging location?    Answer:   [facility] Specific Question:   Radiology Contrast Protocol - do NOT remove file path    Answer:   _0 [email or web address]\Radiant\DXFluoroContrastProtocols.pdf  . Lipase  . Comp Met (CMET)  . CBC    Meds ordered this encounter  Medications  . albuterol (PROVENTIL HFA;VENTOLIN HFA) 108 (90 Base) MCG/ACT inhaler    Sig: Inhale 2 puffs into the lungs every 6 (six) hours as needed for wheezing or shortness of breath.    Dispense:  1 Inhaler    Refill:  0  .  benzonatate (TESSALON) 200 MG capsule    Sig: Take 1 capsule (200 mg total) by mouth 2 (two) times daily as needed for cough.    Dispense:  20 capsule    Refill:  0     Addendum: Chest x-ray with no pneumonia. We'll cover with azithromycin for bronchitis.   Tommi Rumps, MD [place]

## 2016-06-22 NOTE — Telephone Encounter (Signed)
-----   Message from [name], MD sent at 06/22/2016 12:00 PM EDT ----- Please let the patient know his x-ray did not reveal any pneumonia. I will send and azithromycin for him to start on. He should take a probiotic or eat yogurt while on this medication. The antibiotic was sent to his pharmacy. Thanks.

## 2016-06-22 NOTE — Assessment & Plan Note (Signed)
Suspect muscular strain of his abdominal muscles and intercostal muscles related to his coughing. Given tenderness epigastrically we will check lab work to rule out other causes. Given return precautions.

## 2016-06-22 NOTE — Telephone Encounter (Signed)
Left message to return call 

## 2016-06-22 NOTE — Progress Notes (Signed)
Pre visit review using our clinic review tool, if applicable. No additional management support is needed unless otherwise documented below in the visit note. 

## 2016-06-22 NOTE — Patient Instructions (Signed)
Nice to see you. We're going to do a chest x-ray and obtain some lab work. We will contact you with the results and then determine what antibiotics to place you on. If you develop shortness of breath, cough productive of blood, chest pressure, or any new or changing symptoms please seek medical attention medially.

## 2016-06-23 NOTE — Telephone Encounter (Signed)
-----   Message from [name], MD sent at 06/23/2016  9:11 AM EDT ----- Please let the patient know that his white blood cell count is slightly elevated. This could be related to his bronchitis. His other lab work is acceptable. He should proceed with the treatment as advised yesterday. Thanks.

## 2016-06-23 NOTE — Telephone Encounter (Signed)
Pt called back returning your call and stated that you can leave results on his voicemail. Thank you!

## 2016-06-23 NOTE — Telephone Encounter (Signed)
left message to return call

## 2016-06-23 NOTE — Telephone Encounter (Signed)
Patient notified of labs.   

## 2016-07-18 ENCOUNTER — Emergency Department
Admission: EM | Admit: 2016-07-18 | Discharge: 2016-07-18 | Disposition: A | Discharge status: Home / Self Care | Payer: Medicare Other | Attending: Student in an Organized Health Care Education/Training Program | Admitting: Student in an Organized Health Care Education/Training Program

## 2016-07-18 ENCOUNTER — Encounter: Payer: Self-pay | Admitting: Emergency Medicine

## 2016-07-18 ENCOUNTER — Telehealth: Payer: Self-pay | Admitting: Family Medicine

## 2016-07-18 DIAGNOSIS — F17210 Nicotine dependence, cigarettes, uncomplicated: Secondary | ICD-10-CM | POA: Insufficient documentation

## 2016-07-18 DIAGNOSIS — Z5321 Procedure and treatment not carried out due to patient leaving prior to being seen by health care provider: Secondary | ICD-10-CM | POA: Diagnosis not present

## 2016-07-18 DIAGNOSIS — R1030 Lower abdominal pain, unspecified: Secondary | ICD-10-CM | POA: Diagnosis present

## 2016-07-18 DIAGNOSIS — J45909 Unspecified asthma, uncomplicated: Secondary | ICD-10-CM | POA: Diagnosis not present

## 2016-07-18 LAB — URINALYSIS, COMPLETE (UACMP) WITH MICROSCOPIC
Bacteria, UA: NONE SEEN
Bilirubin Urine: NEGATIVE
GLUCOSE, UA: NEGATIVE mg/dL
Hgb urine dipstick: NEGATIVE
KETONES UR: NEGATIVE mg/dL
LEUKOCYTES UA: NEGATIVE
Nitrite: NEGATIVE
PROTEIN: NEGATIVE mg/dL
SQUAMOUS EPITHELIAL / LPF: NONE SEEN
Specific Gravity, Urine: 1.003 — ABNORMAL LOW (ref 1.005–1.030)
pH: 6 (ref 5.0–8.0)

## 2016-07-18 LAB — CBC
HEMATOCRIT: 46.8 % (ref 40.0–52.0)
HEMOGLOBIN: 16.2 g/dL (ref 13.0–18.0)
MCH: 31.2 pg (ref 26.0–34.0)
MCHC: 34.7 g/dL (ref 32.0–36.0)
MCV: 89.7 fL (ref 80.0–100.0)
PLATELETS: 213 10*3/uL (ref 150–440)
RBC: 5.21 MIL/uL (ref 4.40–5.90)
RDW: 13.1 % (ref 11.5–14.5)
WBC: 9.7 10*3/uL (ref 3.8–10.6)

## 2016-07-18 LAB — COMPREHENSIVE METABOLIC PANEL
ALT: 28 U/L (ref 17–63)
ANION GAP: 8 (ref 5–15)
AST: 21 U/L (ref 15–41)
Albumin: 5 g/dL (ref 3.5–5.0)
Alkaline Phosphatase: 93 U/L (ref 38–126)
BUN: 10 mg/dL (ref 6–20)
CO2: 24 mmol/L (ref 22–32)
CREATININE: 0.94 mg/dL (ref 0.61–1.24)
Calcium: 9.9 mg/dL (ref 8.9–10.3)
Chloride: 104 mmol/L (ref 101–111)
GFR calc Af Amer: >60 mL/min (ref >60)
GFR calc non Af Amer: >60 mL/min (ref >60)
Glucose, Bld: 97 mg/dL (ref 65–99)
POTASSIUM: 3.9 mmol/L (ref 3.5–5.1)
SODIUM: 136 mmol/L (ref 135–145)
Total Bilirubin: 0.9 mg/dL (ref 0.3–1.2)
Total Protein: 8.3 g/dL — ABNORMAL HIGH (ref 6.5–8.1)

## 2016-07-18 LAB — LIPASE, BLOOD: Lipase: 18 U/L (ref 11–51)

## 2016-07-18 NOTE — Telephone Encounter (Signed)
Leave message on home number and cell phone number.

## 2016-07-18 NOTE — ED Triage Notes (Signed)
C/O lower abdominal pain that radiates bilaterally to lower back x 1 month.  Denies emesis, only nausea accompanies symptoms.  Last BM:  5/29-- WNL.  Denies dysuria.

## 2016-07-18 NOTE — Telephone Encounter (Signed)
Patients mother advised of below per [facility] and she stated that she would advise patient to go to urgent care for evaluation.

## 2016-07-18 NOTE — ED Notes (Signed)
PT PAGED IN LOBBY. NO ANSWER 

## 2016-07-18 NOTE — Telephone Encounter (Signed)
Pt the following Mychart appointment request:  I am having a hard time swallowing food and have severe pain in the left abdominal area near liver and mid chest area near my hiatal hernia. I need an appointment ASAP with Dr. Caryl Bis, please. Thank you,  Michael [name]

## 2016-07-18 NOTE — Telephone Encounter (Signed)
Patient needs to be evaluated in the emergency room in light of his symptoms. If he is unwilling to do this he should be evaluated at urgent care today.

## 2016-07-18 NOTE — Telephone Encounter (Signed)
Reason for call:trouble swallowing solids,  Symptoms: trouble swallowing, lost 8 lbs in 1 week, taking 1 hours to swallow two pieces of cheese and [place], history hiatal hernia, trouble eating meat and vegetables, abdominal pain mid chest near hiatal hernia rates pain 5/10, pain comes and goes , pain comes and goes with exertion in center of chest where hiatal hernia when lifting boxes and left abdomen is . Duration 2 weeks,  Per mother Medications: Last seen for this problem: Seen by: Hasn't contacted GI MD,  Referred to ED, please advise spoke with mom and states patient will refusing to go to ED  Please advise

## 2016-07-19 ENCOUNTER — Telehealth: Payer: Self-pay | Admitting: Family Medicine

## 2016-07-19 NOTE — Telephone Encounter (Signed)
Noted and previously discussed with LPN.

## 2016-07-19 NOTE — Telephone Encounter (Signed)
Tried to return call was disconnected.

## 2016-07-19 NOTE — Telephone Encounter (Signed)
Patient scheduled for tomorrow at 10:30 c/o left sided low back pain that radiates to front through groin area , with Polyuria and spasms at times per patient. Pain scale now at a 4 with spasm pain is at 8. Patient was advised if he develops a fever , nausea , or pain increases he should return to the ER or urgent care and call office concerning appointment.

## 2016-07-19 NOTE — Telephone Encounter (Signed)
Please call pt at [phone]

## 2016-07-19 NOTE — Telephone Encounter (Signed)
Left message for patient to return my call, concerning my chart message, patient went to ED on 07/18/16 but according to note in chart when patient was paged he never answered call had left ED.

## 2016-07-20 ENCOUNTER — Encounter: Payer: Self-pay | Admitting: Family Medicine

## 2016-07-20 ENCOUNTER — Ambulatory Visit (INDEPENDENT_AMBULATORY_CARE_PROVIDER_SITE_OTHER): Payer: Medicare Other | Admitting: Family Medicine

## 2016-07-20 ENCOUNTER — Ambulatory Visit
Admission: RE | Admit: 2016-07-20 | Discharge: 2016-07-20 | Disposition: A | Discharge status: Home / Self Care | Payer: Medicare Other | Source: Ambulatory Visit | Attending: Family Medicine | Admitting: Family Medicine

## 2016-07-20 ENCOUNTER — Telehealth: Payer: Self-pay | Admitting: Family Medicine

## 2016-07-20 VITALS — BP 156/94 | HR 108 | Temp 98.3°F | Wt 211.8 lb

## 2016-07-20 DIAGNOSIS — R109 Unspecified abdominal pain: Secondary | ICD-10-CM

## 2016-07-20 DIAGNOSIS — R1011 Right upper quadrant pain: Secondary | ICD-10-CM

## 2016-07-20 DIAGNOSIS — K439 Ventral hernia without obstruction or gangrene: Secondary | ICD-10-CM | POA: Diagnosis not present

## 2016-07-20 DIAGNOSIS — J9811 Atelectasis: Secondary | ICD-10-CM | POA: Insufficient documentation

## 2016-07-20 DIAGNOSIS — R55 Syncope and collapse: Secondary | ICD-10-CM

## 2016-07-20 DIAGNOSIS — K219 Gastro-esophageal reflux disease without esophagitis: Secondary | ICD-10-CM | POA: Insufficient documentation

## 2016-07-20 LAB — POCT URINALYSIS DIPSTICK
Bilirubin, UA: NEGATIVE
Blood, UA: NEGATIVE
Glucose, UA: NEGATIVE
KETONES UA: NEGATIVE
LEUKOCYTES UA: NEGATIVE
Nitrite, UA: NEGATIVE
PH UA: 6 (ref 5.0–8.0)
Protein, UA: NEGATIVE
Spec Grav, UA: 1.02 (ref 1.010–1.025)
Urobilinogen, UA: 0.2 E.U./dL

## 2016-07-20 LAB — COMPREHENSIVE METABOLIC PANEL
ALK PHOS: 89 U/L (ref 39–117)
ALT: 27 U/L (ref 0–53)
AST: 18 U/L (ref 0–37)
Albumin: 5 g/dL (ref 3.5–5.2)
BILIRUBIN TOTAL: 0.6 mg/dL (ref 0.2–1.2)
BUN: 9 mg/dL (ref 6–23)
CO2: 27 mEq/L (ref 19–32)
Calcium: 9.8 mg/dL (ref 8.4–10.5)
Chloride: 103 mEq/L (ref 96–112)
Creatinine, Ser: 0.99 mg/dL (ref 0.40–1.50)
GFR: 97.48 mL/min (ref >60.00)
Glucose, Bld: 73 mg/dL (ref 70–99)
Potassium: 3.7 mEq/L (ref 3.5–5.1)
Sodium: 137 mEq/L (ref 135–145)
TOTAL PROTEIN: 7.7 g/dL (ref 6.0–8.3)

## 2016-07-20 MED ORDER — CIPROFLOXACIN HCL 500 MG PO TABS: 500.0000 mg | ORAL_TABLET | Freq: Two times a day (BID) | ORAL | 0 refills | Status: DC

## 2016-07-20 MED ORDER — PANTOPRAZOLE SODIUM 40 MG PO TBEC: 40.0000 mg | DELAYED_RELEASE_TABLET | Freq: Every day | ORAL | 3 refills | Status: DC

## 2016-07-20 MED ORDER — METRONIDAZOLE 500 MG PO TABS: 500.0000 mg | ORAL_TABLET | Freq: Three times a day (TID) | ORAL | 0 refills | Status: DC

## 2016-07-20 NOTE — Assessment & Plan Note (Signed)
Had extensive cardiac workup previously. Symptoms of palpitations improved with metoprolol. He has not had any recurrent syncope. He'll continue to monitor.

## 2016-07-20 NOTE — Assessment & Plan Note (Signed)
Patient with reflux symptoms. Responds to Zantac though he is taking 5 Zantac tablets daily. Does report sensation that food may stick though does not actually stick. We will start him on Protonix. We'll refer to GI.

## 2016-07-20 NOTE — Patient Instructions (Signed)
Nice to see you. We'll get a CT scan to evaluate your discomfort. We'll contact you with the results. Also get lab work. If you develop worsening discomfort or vomit with blood or blood in her stool or any new or changing symptoms please seek medical attention immediately.

## 2016-07-20 NOTE — Assessment & Plan Note (Signed)
Patient with worsening of symptoms over the last week. History sounds somewhat like a kidney stone though he has no history of this and no blood on his dipstick. Flank pain and low back pain radiating to the abdomen. He has back tenderness and also has abdominal tenderness. Given his persistent symptoms we will obtain a CT renal stone study to evaluate for kidney stone and also to get a better idea of intra-abdominal processes. We'll check a CMP. We'll determine the next step in management once these studies return. He is given return precautions.

## 2016-07-20 NOTE — Telephone Encounter (Signed)
Please let the patient know that his CT scan revealed likely enteritis which is likely related to infection and inflammation of part of his small intestine. There were no kidney stones. There were no signs of abscess. We typically treat with antibiotics for this versus monitoring. I have sent in ciprofloxacin and Flagyl for him to start on to treat for potential infectious cause. He needs to stick to a clear liquid diet until his discomfort is improved and he follows up with me. He should be rechecked on Monday. If he develops worsening abdominal discomfort or blood in his stool or fevers he should be evaluated prior to Monday.

## 2016-07-20 NOTE — Telephone Encounter (Signed)
Called patient informed let him know all information had him repeat back to me. He will start meds and follow up on Monday. If any change in condition he will go to be evaluated.

## 2016-07-20 NOTE — Progress Notes (Signed)
Tommi Rumps, MD Phone: [phone]  Michael [name] is a 26 y.o. male who presents today for same-day visit.  Patient notes symptoms over the last month though has worsened over last week. They have been stable over the last week. He notes some low back discomfort that radiates around to his abdomen. It is bilateral. He has right upper quadrant, right lower quadrant, left lower quadrant, and left upper quadrant discomfort in his abdomen. He notes increased urination and urinary urgency. No dysuria. No fevers. Does note some sweats. No diarrhea. Has had some nausea and vomiting with no blood. No radiation to his legs. No numbness or weakness. No loss of bowel or bladder function. No saddle anesthesia. Has a history of cholecystectomy. Does still have his appendix. Does note some reflux symptoms with burning. Feels as though food starts to get stuck though doesn't get stuck. No trouble swallowing liquids. Hasn't had much of an appetite since all this started to worsen over the last week. Has been taking Zantac for reflux for about 8 months. He had cardiac evaluation previously. Had a negative CT coronary test. He reports no further syncopal episodes. Currently taking metoprolol.  PMH: Smoker.   ROS see history of present illness  Objective  Physical Exam Vitals:   07/20/16 1028  BP: (!) 156/94  Pulse: (!) 108  Temp: 98.3 F (36.8 C)    BP Readings from Last 3 Encounters:  07/20/16 (!) 156/94  07/18/16 135/81  06/22/16 132/86   Wt Readings from Last 3 Encounters:  07/20/16 211 lb 12.8 oz (96.1 kg)  07/18/16 212 lb (96.2 kg)  06/22/16 221 lb (100.2 kg)    Physical Exam  Constitutional: No distress.  Cardiovascular: Regular rhythm and normal heart sounds.  Tachycardia present.   Pulmonary/Chest: Effort normal and breath sounds normal.  Abdominal: Soft. Bowel sounds are normal. He exhibits no distension. There is tenderness (scattered tenderness throughout). There is no rebound  and no guarding.  Musculoskeletal: He exhibits no edema.  No midline spine tenderness, no midline spine step-off, there is right lumbar and SI joint discomfort on palpation though no overlying skin changes  Neurological: He is alert. Gait normal.  5/5 strength in bilateral quads, hamstrings, plantar and dorsiflexion, sensation to light touch intact in bilateral LE, normal gait  Skin: Skin is warm and dry. He is not diaphoretic.     Assessment/Plan: Please see individual problem list.  Abdominal pain Patient with worsening of symptoms over the last week. History sounds somewhat like a kidney stone though he has no history of this and no blood on his dipstick. Flank pain and low back pain radiating to the abdomen. He has back tenderness and also has abdominal tenderness. Given his persistent symptoms we will obtain a CT renal stone study to evaluate for kidney stone and also to get a better idea of intra-abdominal processes. We'll check a CMP. We'll determine the next step in management once these studies return. He is given return precautions.  Syncope Had extensive cardiac workup previously. Symptoms of palpitations improved with metoprolol. He has not had any recurrent syncope. He'll continue to monitor.  GERD (gastroesophageal reflux disease) Patient with reflux symptoms. Responds to Zantac though he is taking 5 Zantac tablets daily. Does report sensation that food may stick though does not actually stick. We will start him on Protonix. We'll refer to GI.   Orders Placed This Encounter  Procedures  . CT RENAL STONE STUDY    Standing Status:   Future  Standing Expiration Date:   10/20/2017    Order Specific Question:   Reason for Exam (SYMPTOM  OR DIAGNOSIS REQUIRED)    Answer:   back pain radiating to abdomen, increased urine frequency and urgency    Order Specific Question:   Preferred imaging location?    Answer:   [facility]    Order Specific Question:   Radiology Contrast  Protocol - do NOT remove file path    Answer:   \\[email or web address]\Radiant\CTProtocols.pdf  . Comp Met (CMET)  . Ambulatory referral to Gastroenterology    Referral Priority:   Routine    Referral Type:   Consultation    Referral Reason:   Specialty Services Required    Number of Visits Requested:   1  . POCT Urinalysis Dipstick    Meds ordered this encounter  Medications  . pantoprazole (PROTONIX) 40 MG tablet    Sig: Take 1 tablet (40 mg total) by mouth daily.    Dispense:  30 tablet    Refill:  [place], MD [place]

## 2016-07-24 ENCOUNTER — Ambulatory Visit (INDEPENDENT_AMBULATORY_CARE_PROVIDER_SITE_OTHER): Payer: Medicare Other | Admitting: Family Medicine

## 2016-07-24 ENCOUNTER — Encounter: Payer: Self-pay | Admitting: Family Medicine

## 2016-07-24 VITALS — BP 138/88 | HR 74 | Temp 98.4°F | Wt 214.0 lb

## 2016-07-24 DIAGNOSIS — R109 Unspecified abdominal pain: Secondary | ICD-10-CM

## 2016-07-24 LAB — COMPREHENSIVE METABOLIC PANEL
ALK PHOS: 80 U/L (ref 39–117)
ALT: 31 U/L (ref 0–53)
AST: 21 U/L (ref 0–37)
Albumin: 4.4 g/dL (ref 3.5–5.2)
BUN: 5 mg/dL — AB (ref 6–23)
CO2: 23 mEq/L (ref 19–32)
Calcium: 9.3 mg/dL (ref 8.4–10.5)
Chloride: 106 mEq/L (ref 96–112)
Creatinine, Ser: 0.9 mg/dL (ref 0.40–1.50)
GFR: 108.81 mL/min (ref >60.00)
Glucose, Bld: 103 mg/dL — ABNORMAL HIGH (ref 70–99)
POTASSIUM: 3.5 meq/L (ref 3.5–5.1)
SODIUM: 138 meq/L (ref 135–145)
TOTAL PROTEIN: 6.9 g/dL (ref 6.0–8.3)
Total Bilirubin: 0.4 mg/dL (ref 0.2–1.2)

## 2016-07-24 LAB — LIPASE: LIPASE: 9 U/L — AB (ref 11.0–59.0)

## 2016-07-24 NOTE — Assessment & Plan Note (Signed)
Patient continues to have issues with abdominal pain. He's had symptoms for about a month though have progressively been worsening. Initially concern was for a kidney stone given prior description though CT scan did not reveal this. CT scan did reveal possible enteritis and mesenteric adenitis. He has been trialed on ciprofloxacin and Flagyl though has not had any improvement. We will trial Aleve as an anti-inflammatory for the mesenteric adenitis. We'll have him complete the course of ciprofloxacin and Flagyl. Given his prior history of pancreatitis we'll check a lipase though CT scan was reassuring with regards to this. We'll check his kidney function to ensure adequate hydration. We'll get him back into see his GI physician. He is given return precautions.

## 2016-07-24 NOTE — Patient Instructions (Signed)
Nice to see you. I'm sorry you are not feeling any better. We will try to treat your discomfort with Aleve. You can take 440 mg of over-the-counter Aleve twice daily for the next 3-4 days and then as needed. Please complete the antibiotics. We'll get you in to see your GI physician. If you develop worsening abdominal pain or you develop blood in your stool or fevers or any worsening symptoms please seek medical attention immediately.

## 2016-07-24 NOTE — Progress Notes (Signed)
  Tommi Rumps, MD Phone: [phone]  Michael [name] is a 26 y.o. male who presents today for follow-up.  Patient seen last week for abdominal pain. Found to have potential mild enteritis in his jejunum and possible mesenteric adenitis on CT scan. Overall no other abnormal findings. He was placed on a trial of Cipro and Flagyl. He has been taking these and notes no significant improvement. He has stuck to a clear liquid diet. Notes he continues to have discomfort. No nausea or vomiting. Does note loose stools. No blood in his stool. Does feel as though food sticks at times when swallowing though he has been able to fully swallow. Notes most of his discomfort is epigastric currently. Does have a history of pancreatitis though CT scan did not reveal any inflammatory process of the pancreas.  ROS see history of present illness  Objective  Physical Exam Vitals:   07/24/16 0830  BP: 138/88  Pulse: 74  Temp: 98.4 F (36.9 C)    BP Readings from Last 3 Encounters:  07/24/16 138/88  07/20/16 (!) 156/94  07/18/16 135/81   Wt Readings from Last 3 Encounters:  07/24/16 214 lb (97.1 kg)  07/20/16 211 lb 12.8 oz (96.1 kg)  07/18/16 212 lb (96.2 kg)    Physical Exam  Constitutional: No distress.  Cardiovascular: Normal rate, regular rhythm and normal heart sounds.   Pulmonary/Chest: Effort normal and breath sounds normal.  Abdominal: Soft. Bowel sounds are normal. He exhibits no distension. There is tenderness (scattered tenderness). There is no rebound and no guarding.  Neurological: He is alert.  Skin: Skin is warm and dry. He is not diaphoretic.     Assessment/Plan: Please see individual problem list.  Abdominal pain Patient continues to have issues with abdominal pain. He's had symptoms for about a month though have progressively been worsening. Initially concern was for a kidney stone given prior description though CT scan did not reveal this. CT scan did reveal possible  enteritis and mesenteric adenitis. He has been trialed on ciprofloxacin and Flagyl though has not had any improvement. We will trial Aleve as an anti-inflammatory for the mesenteric adenitis. We'll have him complete the course of ciprofloxacin and Flagyl. Given his prior history of pancreatitis we'll check a lipase though CT scan was reassuring with regards to this. We'll check his kidney function to ensure adequate hydration. We'll get him back into see his GI physician. He is given return precautions.   Orders Placed This Encounter  Procedures  . Comp Met (CMET)  . Lipase   Tommi Rumps, MD [place]

## 2016-07-25 ENCOUNTER — Ambulatory Visit (INDEPENDENT_AMBULATORY_CARE_PROVIDER_SITE_OTHER): Payer: Medicare Other | Admitting: Gastroenterology

## 2016-07-25 ENCOUNTER — Encounter: Payer: Self-pay | Admitting: Gastroenterology

## 2016-07-25 ENCOUNTER — Other Ambulatory Visit (INDEPENDENT_AMBULATORY_CARE_PROVIDER_SITE_OTHER): Payer: Medicare Other

## 2016-07-25 VITALS — BP 118/68 | HR 78 | Ht 67.0 in | Wt 214.0 lb

## 2016-07-25 DIAGNOSIS — R109 Unspecified abdominal pain: Secondary | ICD-10-CM

## 2016-07-25 DIAGNOSIS — B379 Candidiasis, unspecified: Secondary | ICD-10-CM

## 2016-07-25 DIAGNOSIS — R1310 Dysphagia, unspecified: Secondary | ICD-10-CM | POA: Diagnosis not present

## 2016-07-25 DIAGNOSIS — R197 Diarrhea, unspecified: Secondary | ICD-10-CM

## 2016-07-25 DIAGNOSIS — K219 Gastro-esophageal reflux disease without esophagitis: Secondary | ICD-10-CM | POA: Diagnosis not present

## 2016-07-25 LAB — COMPREHENSIVE METABOLIC PANEL
ALBUMIN: 4.6 g/dL (ref 3.5–5.2)
ALT: 33 U/L (ref 0–53)
AST: 21 U/L (ref 0–37)
Alkaline Phosphatase: 82 U/L (ref 39–117)
BUN: 5 mg/dL — ABNORMAL LOW (ref 6–23)
CHLORIDE: 107 meq/L (ref 96–112)
CO2: 26 meq/L (ref 19–32)
Calcium: 9.6 mg/dL (ref 8.4–10.5)
Creatinine, Ser: 0.92 mg/dL (ref 0.40–1.50)
GFR: 106.08 mL/min (ref >60.00)
Glucose, Bld: 96 mg/dL (ref 70–99)
Potassium: 3.9 mEq/L (ref 3.5–5.1)
Sodium: 140 mEq/L (ref 135–145)
Total Bilirubin: 0.4 mg/dL (ref 0.2–1.2)
Total Protein: 7.4 g/dL (ref 6.0–8.3)

## 2016-07-25 LAB — CBC WITH DIFFERENTIAL/PLATELET
BASOS PCT: 0.6 % (ref 0.0–3.0)
Basophils Absolute: 0.1 10*3/uL (ref 0.0–0.1)
EOS ABS: 0.8 10*3/uL — AB (ref 0.0–0.7)
EOS PCT: 6.1 % — AB (ref 0.0–5.0)
HCT: 45 % (ref 39.0–52.0)
HEMOGLOBIN: 15.3 g/dL (ref 13.0–17.0)
LYMPHS PCT: 20.3 % (ref 12.0–46.0)
Lymphs Abs: 2.8 10*3/uL (ref 0.7–4.0)
MCHC: 34 g/dL (ref 30.0–36.0)
MCV: 91.6 fl (ref 78.0–100.0)
MONOS PCT: 7.2 % (ref 3.0–12.0)
Monocytes Absolute: 1 10*3/uL (ref 0.1–1.0)
NEUTROS ABS: 9.2 10*3/uL — AB (ref 1.4–7.7)
Neutrophils Relative %: 65.8 % (ref 43.0–77.0)
Platelets: 241 10*3/uL (ref 150.0–400.0)
RBC: 4.92 Mil/uL (ref 4.22–5.81)
RDW: 13.2 % (ref 11.5–15.5)
WBC: 14 10*3/uL — AB (ref 4.0–10.5)

## 2016-07-25 MED ORDER — FLUCONAZOLE 100 MG PO TABS: 100.0000 mg | ORAL_TABLET | Freq: Every day | ORAL | 0 refills | Status: AC

## 2016-07-25 NOTE — Patient Instructions (Addendum)
You will have labs checked today in the basement lab.  Please head down after you check out with the front desk  (Gi pathogen panel stool, cbc, cmet, HIV test).  Start diflucan (116m pill, take one pill once daily for 10 days), empiric for possible yeast infection.  Stop the cirpo/flagyl for now.  You will be set up for a CT scan of abdomen and pelvis with IV and oral contrast for abdominal pain.   You have been scheduled for a CT [facility] the [facility] at [facility]([id number]this is in the same building as [facility].   You are scheduled on 08/01/16 at 130 pm. You should arrive 15 minutes prior to your appointment time for registration. Please follow the written instructions below on the day of your exam:  WARNING: IF YOU ARE ALLERGIC TO IODINE/X-RAY DYE, PLEASE NOTIFY RADIOLOGY IMMEDIATELY AT [phone] YOU WILL BE GIVEN A 13 HOUR PREMEDICATION PREP.  1) Do not eat or drink anything after 930 am (4 hours prior to your test) 2) You have been given 2 bottles of oral contrast to drink. The solution may taste               better if refrigerated, but do NOT add ice or any other liquid to this solution. Shake             well before drinking.    Drink 1 bottle of contrast @ 1130 am (2 hours prior to your exam)  Drink 1 bottle of contrast @ 1230 pm (1 hour prior to your exam)  You may take any medications as prescribed with a small amount of water except for the following: Metformin, Glucophage, Glucovance, Avandamet, Riomet, Fortamet, Actoplus Met, Janumet, Glumetza or Metaglip. The above medications must be held the day of the exam AND 48 hours after the exam.  The purpose of you drinking the oral contrast is to aid in the visualization of your intestinal tract. The contrast solution may cause some diarrhea. Before your exam is started, you will be given a small amount of fluid to drink. Depending on your individual set of symptoms, you may also receive  an intravenous injection of x-ray contrast/dye. [facility], depending on the type of exam you are having performed.  This test typically takes 30-45 minutes to complete.  If you have any questions regarding your exam or if you need to reschedule, you may call the CT department at [phone] the hours of 8:00 am and 5:00 pm, Monday-Friday.  ________________________________________________________________________   YDennis Bastwill be set up for an upper endoscopy ([facility] this Thursday with MAC) for dysphagia.  Liquids for now.

## 2016-07-25 NOTE — Progress Notes (Signed)
Review of pertinent gastrointestinal problems: 1. Symptomatic Gallstones. June 2017 laparoscopic cholecystectomy. Pathology showed cholelithiasis and chronic cholecystitis. 2. Chronic loose stools: Seen October 2017 felt possibly related to bile acid related diarrhea, Questran was started.  Cholestyramine helped. 3. Sessile serrated polyps (two subCM), colonoscopy 07/2015 Dr. Ardis Hughs; recall at 5 years.  4. Hiatal hernia: noted on EGD 07/2015 done for RUQ pain   HPI: This is a pleasant 26 year old man who is here with his mother today. They have quite an antagonistic relationship. There was a lot of swearing on his part towards his mom until I asked her to just about all complete the visit.  Chief complaint is abdominal pain, dysphagia, weight loss, diarrhea  Pain throughotu abd.  Started about a month ago, getting worse. No vomiting. Pain worse R and Left upper quadrants.  Has been having diarrhea for about two weeks.  Has been losing 8-10 pounds.  Drugs: smokes pot at least twice a day.  dyspahgia to solids and getting worse with liquids.  This has been going on for about a month. He was on antibiotics 1-2 months ago months ago, a Z-Pak for what sounds like a URI.   Ct scan (no IV contrast) 07/20/2016 (stone protocol): There are loops of proximal jejunum in the left upper quadrant with bowel wall thickening. Question a degree of enteritis. No surrounding mesenteric inflammation. No bowel obstruction. Other bowel loops appear unremarkable.  Small lymph nodes are noted in the abdomen, most notably in the right mid abdominal region. In the appropriate clinical setting, this appearance may be seen with mesenteric adenitis. There is no frank adenopathy in the abdomen or pelvis by size criteria.     ROS: complete GI ROS as described in HPI, all other review negative.  Constitutional:  No unintentional weight loss   Past Medical History:  Diagnosis Date  . Allergy   . Anxiety    . Asthma   . Chest pain last few months  . Depression   . GERD (gastroesophageal reflux disease)   . Pancreatitis last 2015  . Substance abuse     Past Surgical History:  Procedure Laterality Date  . CHOLECYSTECTOMY  08/16/2015   Procedure: LAPAROSCOPIC CHOLECYSTECTOMY;  Surgeon: Alphonsa Overall, MD;  Location: [facility];  Service: General;;  . TONSILLECTOMY AND ADENOIDECTOMY  as child   and adenoids    Current Outpatient Prescriptions  Medication Sig Dispense Refill  . albuterol (PROVENTIL HFA;VENTOLIN HFA) 108 (90 Base) MCG/ACT inhaler Inhale 2 puffs into the lungs every 6 (six) hours as needed for wheezing or shortness of breath. 1 Inhaler 0  . cholestyramine (QUESTRAN) 4 g packet Take 1 packet (4 g total) by mouth 3 (three) times daily with meals. Take 2 hours away from other medications. 90 each 1  . ciprofloxacin (CIPRO) 500 MG tablet Take 1 tablet (500 mg total) by mouth 2 (two) times daily. 14 tablet 0  . metroNIDAZOLE (FLAGYL) 500 MG tablet Take 1 tablet (500 mg total) by mouth 3 (three) times daily. 21 tablet 0  . pantoprazole (PROTONIX) 40 MG tablet Take 1 tablet (40 mg total) by mouth daily. 30 tablet 3  . ranitidine (ZANTAC) 150 MG tablet Take 1 tablet (150 mg total) by mouth 2 (two) times daily. 60 tablet 1  . metoprolol tartrate (LOPRESSOR) 50 MG tablet Take 1 tablet (50 mg total) by mouth 2 (two) times daily. 60 tablet 3   No current facility-administered medications for this visit.     Allergies as of  07/25/2016 - Review Complete 07/25/2016  Allergen Reaction Noted  . Bee venom Anaphylaxis 06/14/2015  . Quetiapine Rash 06/21/2015  . Serotonin reuptake inhibitors (ssris) Rash 10/09/2014  . Gabapentin Nausea Only and Nausea And Vomiting 10/09/2014  . Lurasidone  06/21/2015  . Prednisone Other (See Comments) 10/09/2014  . Latuda [lurasidone hcl] Rash and Nausea And Vomiting 10/09/2014    Family History  Problem Relation Age of Onset  . Adopted: Yes  . Diabetes  Paternal Grandfather   . Lung cancer Paternal Grandfather   . Ulcers Father   . Colon cancer Neg Hx     Social History   Social History  . Marital status: Single    Spouse name: N/A  . Number of children: 2  . Years of education: N/A   Occupational History  . Not on file.   Social History Main Topics  . Smoking status: Current Every Day Smoker    Packs/day: 1.50    Types: Cigarettes  . Smokeless tobacco: Former Systems developer  . Alcohol use 0.0 oz/week     Comment: socially; only drinks on the weekend but to the point of passing out  . Drug use: No     Comment: heroin, cocaine, acid, pain killers; last used 3 years ago  . Sexual activity: Yes    Partners: Female   Other Topics Concern  . Not on file   Social History Narrative   Lives at home with parents.      Work - none at present, has worked Lawyer - Completed high school      Hobbies - computers, outdoors, fishing      Diet - regular      Exercise - none     Physical Exam: BP 118/68 (BP Location: Left Arm, Patient Position: Sitting, Cuff Size: Normal)   Pulse 78   Ht 5\' 7"  (1.702 m)   Wt 214 lb (97.1 kg)   BMI 33.52 kg/m  Constitutional: generally well-appearing No obvious thrush in his mouth, throat Psychiatric: alert and oriented x3 Abdomen: soft, Moderately tender throughout his abdomen nondistended, no obvious ascites, no peritoneal signs, normal bowel sounds No peripheral edema noted in lower extremities  Assessment and plan: 26 y.o. male with multiple GI complaints.  First he is having significant dysphagia to solids and even liquids. This is been over about a month and is getting worse. He was on antibiotics for what sounds like an upper respiratory infection recently. I didn't see any thrush in his mouth but this certainly could be candida infection of the esophagus. I am going to start him empirically on Diflucan 100 mg pills, one pill daily for 10 days.  I'm also proceed with  upper endoscopy in 2 days from now to check for other potential causes.  He also has pretty intense abdominal pain both right upper quadrant and left upper quadrant that seems to be getting worse. He had a renal stone study protocol CT scan about a week ago and it suggested some possible enteritis. This is non-IV contrast and so of limited value in evaluating for GI related abdominal pain. Going to get a repeat CT scan of his abdomen and pelvis with IV contrast. I'm also repeating some blood work on him today including CBC, complete metabolic profile, GI pathogen panel. He is pretty heavy pot user twice daily. He also used to smoke heroin. I asked him if these are been tested for HIV and he does not think  he has. I'm ordering an HIV test for him today as well.  He is going to stop cipro/flagyl for now.  Has completed 5 days.  Please see the "Patient Instructions" section for addition details about the plan.  Owens Loffler, MD [facility] 07/25/2016, 2:38 PM

## 2016-07-26 ENCOUNTER — Encounter (HOSPITAL_COMMUNITY): Payer: Self-pay | Admitting: *Deleted

## 2016-07-26 ENCOUNTER — Telehealth: Payer: Self-pay | Admitting: Gastroenterology

## 2016-07-26 LAB — HIV ANTIBODY (ROUTINE TESTING W REFLEX): HIV 1&2 Ab, 4th Generation: NONREACTIVE

## 2016-07-26 NOTE — Telephone Encounter (Signed)
See result note.  

## 2016-07-27 ENCOUNTER — Ambulatory Visit (HOSPITAL_COMMUNITY)
Admission: RE | Admit: 2016-07-27 | Discharge: 2016-07-27 | Disposition: A | Discharge status: Home / Self Care | Payer: Medicare Other | Source: Ambulatory Visit | Attending: Gastroenterology | Admitting: Gastroenterology

## 2016-07-27 ENCOUNTER — Encounter (HOSPITAL_COMMUNITY): Payer: Self-pay | Admitting: *Deleted

## 2016-07-27 ENCOUNTER — Ambulatory Visit (HOSPITAL_COMMUNITY): Payer: Medicare Other | Admitting: Certified Registered Nurse Anesthetist

## 2016-07-27 ENCOUNTER — Encounter (HOSPITAL_COMMUNITY)
Admission: RE | Disposition: A | Discharge status: Home / Self Care | Payer: Self-pay | Source: Ambulatory Visit | Attending: Gastroenterology

## 2016-07-27 DIAGNOSIS — F17210 Nicotine dependence, cigarettes, uncomplicated: Secondary | ICD-10-CM | POA: Insufficient documentation

## 2016-07-27 DIAGNOSIS — K219 Gastro-esophageal reflux disease without esophagitis: Secondary | ICD-10-CM | POA: Diagnosis not present

## 2016-07-27 DIAGNOSIS — Z833 Family history of diabetes mellitus: Secondary | ICD-10-CM | POA: Insufficient documentation

## 2016-07-27 DIAGNOSIS — F1290 Cannabis use, unspecified, uncomplicated: Secondary | ICD-10-CM | POA: Diagnosis not present

## 2016-07-27 DIAGNOSIS — Z9889 Other specified postprocedural states: Secondary | ICD-10-CM | POA: Diagnosis not present

## 2016-07-27 DIAGNOSIS — J45909 Unspecified asthma, uncomplicated: Secondary | ICD-10-CM | POA: Diagnosis not present

## 2016-07-27 DIAGNOSIS — Z888 Allergy status to other drugs, medicaments and biological substances status: Secondary | ICD-10-CM | POA: Diagnosis not present

## 2016-07-27 DIAGNOSIS — Z7951 Long term (current) use of inhaled steroids: Secondary | ICD-10-CM | POA: Diagnosis not present

## 2016-07-27 DIAGNOSIS — B379 Candidiasis, unspecified: Secondary | ICD-10-CM

## 2016-07-27 DIAGNOSIS — Z9049 Acquired absence of other specified parts of digestive tract: Secondary | ICD-10-CM | POA: Insufficient documentation

## 2016-07-27 DIAGNOSIS — Z91030 Bee allergy status: Secondary | ICD-10-CM | POA: Insufficient documentation

## 2016-07-27 DIAGNOSIS — K222 Esophageal obstruction: Secondary | ICD-10-CM | POA: Diagnosis not present

## 2016-07-27 DIAGNOSIS — R1310 Dysphagia, unspecified: Secondary | ICD-10-CM

## 2016-07-27 DIAGNOSIS — R197 Diarrhea, unspecified: Secondary | ICD-10-CM

## 2016-07-27 DIAGNOSIS — Z801 Family history of malignant neoplasm of trachea, bronchus and lung: Secondary | ICD-10-CM | POA: Diagnosis not present

## 2016-07-27 DIAGNOSIS — Z8719 Personal history of other diseases of the digestive system: Secondary | ICD-10-CM | POA: Insufficient documentation

## 2016-07-27 DIAGNOSIS — Z79899 Other long term (current) drug therapy: Secondary | ICD-10-CM | POA: Insufficient documentation

## 2016-07-27 DIAGNOSIS — R109 Unspecified abdominal pain: Secondary | ICD-10-CM

## 2016-07-27 HISTORY — DX: Family history of other specified conditions: Z84.89

## 2016-07-27 HISTORY — PX: ESOPHAGOGASTRODUODENOSCOPY (EGD) WITH PROPOFOL: SHX5813

## 2016-07-27 SURGERY — ESOPHAGOGASTRODUODENOSCOPY (EGD) WITH PROPOFOL
Anesthesia: Monitor Anesthesia Care

## 2016-07-27 MED ORDER — LIDOCAINE 2% (20 MG/ML) 5 ML SYRINGE
INTRAMUSCULAR | Status: AC
  Filled 2016-07-27: qty 5

## 2016-07-27 MED ORDER — SODIUM CHLORIDE 0.9 % IV SOLN: INTRAVENOUS | Status: DC

## 2016-07-27 MED ORDER — ONDANSETRON HCL 4 MG/2ML IJ SOLN
INTRAMUSCULAR | Status: DC | PRN
  Administered 2016-07-27: 4 mg via INTRAVENOUS

## 2016-07-27 MED ORDER — PROPOFOL 500 MG/50ML IV EMUL
INTRAVENOUS | Status: DC | PRN
  Administered 2016-07-27: 125 ug/kg/min via INTRAVENOUS

## 2016-07-27 MED ORDER — LACTATED RINGERS IV SOLN
INTRAVENOUS | Status: DC
  Administered 2016-07-27: 13:00:00 via INTRAVENOUS

## 2016-07-27 MED ORDER — PROPOFOL 10 MG/ML IV BOLUS
INTRAVENOUS | Status: DC | PRN
Start: 2016-07-27 — End: 2016-07-27
  Administered 2016-07-27: 10 mg via INTRAVENOUS
  Administered 2016-07-27 (×2): 20 mg via INTRAVENOUS
  Administered 2016-07-27 (×2): 40 mg via INTRAVENOUS
  Administered 2016-07-27: 30 mg via INTRAVENOUS
  Administered 2016-07-27: 40 mg via INTRAVENOUS

## 2016-07-27 MED ORDER — LIDOCAINE 2% (20 MG/ML) 5 ML SYRINGE
INTRAMUSCULAR | Status: DC | PRN
  Administered 2016-07-27: 100 mg via INTRAVENOUS

## 2016-07-27 MED ORDER — ONDANSETRON HCL 4 MG/2ML IJ SOLN
INTRAMUSCULAR | Status: AC
  Filled 2016-07-27: qty 2

## 2016-07-27 MED ORDER — PROPOFOL 10 MG/ML IV BOLUS
INTRAVENOUS | Status: AC
  Filled 2016-07-27: qty 60

## 2016-07-27 SURGICAL SUPPLY — 14 items

## 2016-07-27 NOTE — Op Note (Addendum)
[facility] Patient Name: Michael [name] Procedure Date: 07/27/2016 MRN: [id number] Attending MD: Milus Banister , MD Date of Birth: [birth year] CSN: [id number] Age: 26 Admit Type: Outpatient Procedure:                Upper GI endoscopy Indications:              Dysphagia Providers:                Milus Banister, MD, Carmie End, RN, Marcene Duos, Technician Referring MD:              Medicines:                Monitored Anesthesia Care Complications:            No immediate complications. Estimated blood loss:                            None. Estimated Blood Loss:     Estimated blood loss: none. Procedure:                Pre-Anesthesia Assessment:                           - Prior to the procedure, a History and Physical                            was performed, and patient medications and                            allergies were reviewed. The patient's tolerance of                            previous anesthesia was also reviewed. The risks                            and benefits of the procedure and the sedation                            options and risks were discussed with the patient.                            All questions were answered, and informed consent                            was obtained. Prior Anticoagulants: The patient has                            taken no previous anticoagulant or antiplatelet                            agents. ASA Grade Assessment: II - A patient with                            mild systemic disease.  After reviewing the risks                            and benefits, the patient was deemed in                            satisfactory condition to undergo the procedure.                           After obtaining informed consent, the endoscope was                            passed under direct vision. Throughout the                            procedure, the patient's blood pressure, pulse, and                             oxygen saturations were monitored continuously. The                            [id number]) scope was introduced through the                            mouth, and advanced to the second part of duodenum.                            The upper GI endoscopy was accomplished without                            difficulty. The patient tolerated the procedure                            well. Scope In: Scope Out: Findings:      One mild benign-appearing, intrinsic stenosis was found at the       gastroesophageal junction. And was traversed. A TTS dilator was passed       through the scope. Dilation with an 18-19-20 mm balloon dilator was       performed to 20 mm.      The exam was otherwise without abnormality. Impression:               - Benign-appearing, mild esophageal stenosis.                            Dilated.                           - The examination was otherwise normal.                           - No specimens collected. Moderate Sedation:      N/A- Per Anesthesia Care Recommendation:           - Patient has a contact number available for  emergencies. The signs and symptoms of potential                            delayed complications were discussed with the                            patient. Return to normal activities tomorrow.                            Written discharge instructions were provided to the                            patient.                           - Continue present medications. Please complete the                            diflucan which you started 2 days ago.                           - Liquid diet, advance as tolerated.                           - IT IS MOST IMPORTANT THAT YOU STOP ALL ILLICIT                            DRUGS. I THINK THESE MAY BE CAUSING SOME OR                            POSSIBLY ALL OF YOUR GI SYMPTOMS.                           - Continue with plans for repeat CT scan abd/pelvis                             with IV and oral contrast. Procedure Code(s):        --- Professional ---                           [phone], Esophagogastroduodenoscopy, flexible,                            transoral; with transendoscopic balloon dilation of                            esophagus (less than 30 mm diameter) Diagnosis Code(s):        --- Professional ---                           K22.2, Esophageal obstruction                           R13.10, Dysphagia, unspecified CPT copyright 2016 American Medical Association. All rights reserved. The codes documented in this report are  preliminary and upon coder review may  be revised to meet current compliance requirements. Milus Banister, MD 07/27/2016 2:05:08 PM This report has been signed electronically. Number of Addenda: 0

## 2016-07-27 NOTE — Anesthesia Preprocedure Evaluation (Addendum)
Anesthesia Evaluation  Patient identified by MRN, date of birth, ID band Patient awake    Reviewed: Allergy & Precautions, NPO status , Patient's Chart, lab work & pertinent test results  Airway Mallampati: I       Dental no notable dental hx.    Pulmonary asthma , Current Smoker,    Pulmonary exam normal        Cardiovascular hypertension,  Rhythm:Regular Rate:Normal     Neuro/Psych PSYCHIATRIC DISORDERS Anxiety Depression Bipolar Disorder negative neurological ROS     GI/Hepatic Neg liver ROS, GERD  Medicated,  Endo/Other  negative endocrine ROS  Renal/GU negative Renal ROS  negative genitourinary   Musculoskeletal negative musculoskeletal ROS (+)   Abdominal   Peds negative pediatric ROS (+)  Hematology negative hematology ROS (+)   Anesthesia Other Findings Day of surgery medications reviewed with the patient.  Reproductive/Obstetrics negative OB ROS                            Anesthesia Physical Anesthesia Plan  ASA: II  Anesthesia Plan: MAC   Post-op Pain Management:    Induction: Intravenous  PONV Risk Score and Plan: 0 and Propofol  Airway Management Planned:   Additional Equipment:   Intra-op Plan:   Post-operative Plan:   Informed Consent: I have reviewed the patients History and Physical, chart, labs and discussed the procedure including the risks, benefits and alternatives for the proposed anesthesia with the patient or authorized representative who has indicated his/her understanding and acceptance.     Plan Discussed with: CRNA  Anesthesia Plan Comments:         Anesthesia Quick Evaluation

## 2016-07-27 NOTE — Interval H&P Note (Signed)
History and Physical Interval Note:  07/27/2016 1:08 PM  Michael [name]  has presented today for surgery, with the diagnosis of dysphagia   The various methods of treatment have been discussed with the patient and family. After consideration of risks, benefits and other options for treatment, the patient has consented to  Procedure(s): ESOPHAGOGASTRODUODENOSCOPY (EGD) WITH PROPOFOL (N/A) as a surgical intervention .  The patient's history has been reviewed, patient examined, no change in status, stable for surgery.  I have reviewed the patient's chart and labs.  Questions were answered to the patient's satisfaction.     Milus Banister

## 2016-07-27 NOTE — Discharge Instructions (Signed)
YOU HAD AN ENDOSCOPIC PROCEDURE TODAY: Refer to the procedure report and other information in the discharge instructions given to you for any specific questions about what was found during the examination. If this information does not answer your questions, please call [facility] office at [phone] to clarify.  ° °YOU SHOULD EXPECT: Some feelings of bloating in the abdomen. Passage of more gas than usual. Walking can help get rid of the air that was put into your GI tract during the procedure and reduce the bloating. If you had a lower endoscopy (such as a colonoscopy or flexible sigmoidoscopy) you may notice spotting of blood in your stool or on the toilet paper. Some abdominal soreness may be present for a day or two, also. ° °DIET: Your first meal following the procedure should be a light meal and then it is ok to progress to your normal diet. A half-sandwich or bowl of soup is an example of a good first meal. Heavy or fried foods are harder to digest and may make you feel nauseous or bloated. Drink plenty of fluids but you should avoid alcoholic beverages for 24 hours. If you had a esophageal dilation, please see attached instructions for diet.   ° °ACTIVITY: Your care partner should take you home directly after the procedure. You should plan to take it easy, moving slowly for the rest of the day. You can resume normal activity the day after the procedure however YOU SHOULD NOT DRIVE, use power tools, machinery or perform tasks that involve climbing or major physical exertion for 24 hours (because of the sedation medicines used during the test).  ° °SYMPTOMS TO REPORT IMMEDIATELY: °A gastroenterologist can be reached at any hour. Please call [phone]  for any of the following symptoms:  °Following lower endoscopy (colonoscopy, flexible sigmoidoscopy) °Excessive amounts of blood in the stool  °Significant tenderness, worsening of abdominal pains  °Swelling of the abdomen that is new, acute  °Fever of 100° or  higher  °Following upper endoscopy (EGD, EUS, ERCP, esophageal dilation) °Vomiting of blood or coffee ground material  °New, significant abdominal pain  °New, significant chest pain or pain under the shoulder blades  °Painful or persistently difficult swallowing  °New shortness of breath  °Black, tarry-looking or red, bloody stools ° °FOLLOW UP:  °If any biopsies were taken you will be contacted by phone or by letter within the next 1-3 weeks. Call [phone]  if you have not heard about the biopsies in 3 weeks.  °Please also call with any specific questions about appointments or follow up tests. ° °

## 2016-07-27 NOTE — Transfer of Care (Signed)
Immediate Anesthesia Transfer of Care Note  Patient: Michael [name]  Procedure(s) Performed: Procedure(s): ESOPHAGOGASTRODUODENOSCOPY (EGD) WITH PROPOFOL (N/A)  Patient Location: ENDO  Anesthesia Type:MAC  Level of Consciousness:  sedated, patient cooperative and responds to stimulation  Airway & Oxygen Therapy:Patient Spontanous Breathing and Patient connected to face mask oxgen  Post-op Assessment:  Report given to ENDO RN and Post -op Vital signs reviewed and stable  Post vital signs:  Reviewed and stable  Last Vitals:  Vitals:   07/27/16 1407 07/27/16 1410  BP:  115/89  Pulse: 77 70  Resp:  (!) 21  Temp:      Complications: No apparent anesthesia complications

## 2016-07-27 NOTE — Progress Notes (Signed)
After the endoscopy I spoke with him in recovery about my concern for his drug use.  He admitted that lately he's been smoking MUCH MORE marijuana that he usually does (lately 1-2 ounces per week). This is very likely contributing to his GI symptoms.

## 2016-07-27 NOTE — H&P (View-Only) (Signed)
Review of pertinent gastrointestinal problems: 1. Symptomatic Gallstones. June 2017 laparoscopic cholecystectomy. Pathology showed cholelithiasis and chronic cholecystitis. 2. Chronic loose stools: Seen October 2017 felt possibly related to bile acid related diarrhea, Questran was started.  Cholestyramine helped. 3. Sessile serrated polyps (two subCM), colonoscopy 07/2015 Dr. Ardis Hughs; recall at 5 years.  4. Hiatal hernia: noted on EGD 07/2015 done for RUQ pain   HPI: This is a pleasant 26 year old man who is here with his mother today. They have quite an antagonistic relationship. There was a lot of swearing on his part towards his mom until I asked her to just about all complete the visit.  Chief complaint is abdominal pain, dysphagia, weight loss, diarrhea  Pain throughotu abd.  Started about a month ago, getting worse. No vomiting. Pain worse R and Left upper quadrants.  Has been having diarrhea for about two weeks.  Has been losing 8-10 pounds.  Drugs: smokes pot at least twice a day.  dyspahgia to solids and getting worse with liquids.  This has been going on for about a month. He was on antibiotics 1-2 months ago months ago, a Z-Pak for what sounds like a URI.   Ct scan (no IV contrast) 07/20/2016 (stone protocol): There are loops of proximal jejunum in the left upper quadrant with bowel wall thickening. Question a degree of enteritis. No surrounding mesenteric inflammation. No bowel obstruction. Other bowel loops appear unremarkable.  Small lymph nodes are noted in the abdomen, most notably in the right mid abdominal region. In the appropriate clinical setting, this appearance may be seen with mesenteric adenitis. There is no frank adenopathy in the abdomen or pelvis by size criteria.     ROS: complete GI ROS as described in HPI, all other review negative.  Constitutional:  No unintentional weight loss   Past Medical History:  Diagnosis Date  . Allergy   . Anxiety    . Asthma   . Chest pain last few months  . Depression   . GERD (gastroesophageal reflux disease)   . Pancreatitis last 2015  . Substance abuse     Past Surgical History:  Procedure Laterality Date  . CHOLECYSTECTOMY  08/16/2015   Procedure: LAPAROSCOPIC CHOLECYSTECTOMY;  Surgeon: Alphonsa Overall, MD;  Location: [facility];  Service: General;;  . TONSILLECTOMY AND ADENOIDECTOMY  as child   and adenoids    Current Outpatient Prescriptions  Medication Sig Dispense Refill  . albuterol (PROVENTIL HFA;VENTOLIN HFA) 108 (90 Base) MCG/ACT inhaler Inhale 2 puffs into the lungs every 6 (six) hours as needed for wheezing or shortness of breath. 1 Inhaler 0  . cholestyramine (QUESTRAN) 4 g packet Take 1 packet (4 g total) by mouth 3 (three) times daily with meals. Take 2 hours away from other medications. 90 each 1  . ciprofloxacin (CIPRO) 500 MG tablet Take 1 tablet (500 mg total) by mouth 2 (two) times daily. 14 tablet 0  . metroNIDAZOLE (FLAGYL) 500 MG tablet Take 1 tablet (500 mg total) by mouth 3 (three) times daily. 21 tablet 0  . pantoprazole (PROTONIX) 40 MG tablet Take 1 tablet (40 mg total) by mouth daily. 30 tablet 3  . ranitidine (ZANTAC) 150 MG tablet Take 1 tablet (150 mg total) by mouth 2 (two) times daily. 60 tablet 1  . metoprolol tartrate (LOPRESSOR) 50 MG tablet Take 1 tablet (50 mg total) by mouth 2 (two) times daily. 60 tablet 3   No current facility-administered medications for this visit.     Allergies as of  07/25/2016 - Review Complete 07/25/2016  Allergen Reaction Noted  . Bee venom Anaphylaxis 06/14/2015  . Quetiapine Rash 06/21/2015  . Serotonin reuptake inhibitors (ssris) Rash 10/09/2014  . Gabapentin Nausea Only and Nausea And Vomiting 10/09/2014  . Lurasidone  06/21/2015  . Prednisone Other (See Comments) 10/09/2014  . Latuda [lurasidone hcl] Rash and Nausea And Vomiting 10/09/2014    Family History  Problem Relation Age of Onset  . Adopted: Yes  . Diabetes  Paternal Grandfather   . Lung cancer Paternal Grandfather   . Ulcers Father   . Colon cancer Neg Hx     Social History   Social History  . Marital status: Single    Spouse name: N/A  . Number of children: 2  . Years of education: N/A   Occupational History  . Not on file.   Social History Main Topics  . Smoking status: Current Every Day Smoker    Packs/day: 1.50    Types: Cigarettes  . Smokeless tobacco: Former Systems developer  . Alcohol use 0.0 oz/week     Comment: socially; only drinks on the weekend but to the point of passing out  . Drug use: No     Comment: heroin, cocaine, acid, pain killers; last used 3 years ago  . Sexual activity: Yes    Partners: Female   Other Topics Concern  . Not on file   Social History Narrative   Lives at home with parents.      Work - none at present, has worked Lawyer - Completed high school      Hobbies - computers, outdoors, fishing      Diet - regular      Exercise - none     Physical Exam: BP 118/68 (BP Location: Left Arm, Patient Position: Sitting, Cuff Size: Normal)   Pulse 78   Ht 5\' 7"  (1.702 m)   Wt 214 lb (97.1 kg)   BMI 33.52 kg/m  Constitutional: generally well-appearing No obvious thrush in his mouth, throat Psychiatric: alert and oriented x3 Abdomen: soft, Moderately tender throughout his abdomen nondistended, no obvious ascites, no peritoneal signs, normal bowel sounds No peripheral edema noted in lower extremities  Assessment and plan: 26 y.o. male with multiple GI complaints.  First he is having significant dysphagia to solids and even liquids. This is been over about a month and is getting worse. He was on antibiotics for what sounds like an upper respiratory infection recently. I didn't see any thrush in his mouth but this certainly could be candida infection of the esophagus. I am going to start him empirically on Diflucan 100 mg pills, one pill daily for 10 days.  I'm also proceed with  upper endoscopy in 2 days from now to check for other potential causes.  He also has pretty intense abdominal pain both right upper quadrant and left upper quadrant that seems to be getting worse. He had a renal stone study protocol CT scan about a week ago and it suggested some possible enteritis. This is non-IV contrast and so of limited value in evaluating for GI related abdominal pain. Going to get a repeat CT scan of his abdomen and pelvis with IV contrast. I'm also repeating some blood work on him today including CBC, complete metabolic profile, GI pathogen panel. He is pretty heavy pot user twice daily. He also used to smoke heroin. I asked him if these are been tested for HIV and he does not think  he has. I'm ordering an HIV test for him today as well.  He is going to stop cipro/flagyl for now.  Has completed 5 days.  Please see the "Patient Instructions" section for addition details about the plan.  Owens Loffler, MD [facility] 07/25/2016, 2:38 PM

## 2016-07-28 ENCOUNTER — Encounter: Payer: Self-pay | Admitting: Family Medicine

## 2016-07-28 ENCOUNTER — Encounter (HOSPITAL_COMMUNITY): Payer: Self-pay | Admitting: Gastroenterology

## 2016-07-28 ENCOUNTER — Other Ambulatory Visit: Payer: Self-pay | Admitting: Family Medicine

## 2016-07-28 MED ORDER — EPINEPHRINE 0.3 MG/0.3ML IJ SOAJ: 0.3000 mg | Freq: Once | INTRAMUSCULAR | 0 refills | Status: AC

## 2016-07-28 NOTE — Anesthesia Postprocedure Evaluation (Signed)
Anesthesia Post Note  Patient: Michael [name]  Procedure(s) Performed: Procedure(s) (LRB): ESOPHAGOGASTRODUODENOSCOPY (EGD) WITH PROPOFOL (N/A)     Patient location during evaluation: PACU Anesthesia Type: MAC Level of consciousness: awake and alert Pain management: pain level controlled Vital Signs Assessment: post-procedure vital signs reviewed and stable Respiratory status: spontaneous breathing, nonlabored ventilation, respiratory function stable and patient connected to nasal cannula oxygen Cardiovascular status: stable and blood pressure returned to baseline Anesthetic complications: no    Last Vitals:  Vitals:   07/27/16 1435 07/27/16 1440  BP:    Pulse: 64 66  Resp: 15 13  Temp:      Last Pain:  Vitals:   07/27/16 1407  TempSrc:   PainSc: [place] Cristabel Bicknell

## 2016-08-01 ENCOUNTER — Ambulatory Visit (INDEPENDENT_AMBULATORY_CARE_PROVIDER_SITE_OTHER)
Admission: RE | Admit: 2016-08-01 | Discharge: 2016-08-01 | Disposition: A | Discharge status: Home / Self Care | Payer: Medicare Other | Source: Ambulatory Visit | Attending: Gastroenterology | Admitting: Gastroenterology

## 2016-08-01 ENCOUNTER — Encounter: Payer: Self-pay | Admitting: Gastroenterology

## 2016-08-01 DIAGNOSIS — R109 Unspecified abdominal pain: Secondary | ICD-10-CM | POA: Diagnosis not present

## 2016-08-01 DIAGNOSIS — K219 Gastro-esophageal reflux disease without esophagitis: Secondary | ICD-10-CM

## 2016-08-01 DIAGNOSIS — R1310 Dysphagia, unspecified: Secondary | ICD-10-CM

## 2016-08-01 DIAGNOSIS — R197 Diarrhea, unspecified: Secondary | ICD-10-CM

## 2016-08-01 DIAGNOSIS — B379 Candidiasis, unspecified: Secondary | ICD-10-CM

## 2016-08-01 MED ORDER — IOPAMIDOL (ISOVUE-300) INJECTION 61%
100.0000 mL | Freq: Once | INTRAVENOUS | Status: AC | PRN
  Administered 2016-08-01: 100 mL via INTRAVENOUS

## 2016-08-02 ENCOUNTER — Telehealth: Payer: Self-pay | Admitting: Gastroenterology

## 2016-08-02 NOTE — Telephone Encounter (Signed)
Michael Banister, MD sent to Michael Massed, RN          Please call the patient. The CT scan is essentially normal. Should continue with the suggestions outlined at recent visit (that is he needs to significantly cut back or stop smoking marijuana altogether, has been smoking extreme amounts recently).

## 2016-08-02 NOTE — Telephone Encounter (Signed)
Left message on machine to call back  

## 2016-08-03 NOTE — Telephone Encounter (Signed)
Pt's mother advised of test results.  (she is on the [facility])

## 2016-08-03 NOTE — Telephone Encounter (Signed)
Notes recorded by Milus Banister, MD on 07/26/2016 at 8:00 AM EDT   Please call the patient. The labs were all normal (HIV negative), except slightly elevated WBC. Should continue with the suggestions outlined at recent visit.

## 2016-08-07 ENCOUNTER — Other Ambulatory Visit: Payer: Self-pay | Admitting: Family Medicine

## 2016-08-07 ENCOUNTER — Encounter: Payer: Self-pay | Admitting: Family Medicine

## 2016-08-07 DIAGNOSIS — F419 Anxiety disorder, unspecified: Secondary | ICD-10-CM

## 2016-08-18 ENCOUNTER — Encounter: Payer: Self-pay | Admitting: Family Medicine

## 2016-08-31 ENCOUNTER — Ambulatory Visit (INDEPENDENT_AMBULATORY_CARE_PROVIDER_SITE_OTHER): Payer: 59 | Admitting: Licensed Clinical Social Worker

## 2016-08-31 DIAGNOSIS — F411 Generalized anxiety disorder: Secondary | ICD-10-CM | POA: Diagnosis not present

## 2016-08-31 DIAGNOSIS — F331 Major depressive disorder, recurrent, moderate: Secondary | ICD-10-CM | POA: Diagnosis not present

## 2016-09-08 ENCOUNTER — Telehealth: Payer: Self-pay

## 2016-09-08 NOTE — Telephone Encounter (Signed)
pt mother called left message on answering services that her son was seen last thursday and that you were going to send him to [place],  she needs to find out when and where was this set up yet,.

## 2016-09-11 ENCOUNTER — Telehealth (HOSPITAL_COMMUNITY): Payer: Self-pay | Admitting: Psychiatry

## 2016-09-11 NOTE — Telephone Encounter (Signed)
Patient was referred to [facility]
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
## 2016-09-19 ENCOUNTER — Ambulatory Visit: Payer: Medicare Other | Admitting: Psychology

## 2016-10-24 ENCOUNTER — Encounter: Payer: Self-pay | Admitting: Family

## 2016-10-24 ENCOUNTER — Ambulatory Visit (INDEPENDENT_AMBULATORY_CARE_PROVIDER_SITE_OTHER): Payer: Medicare Other | Admitting: Family

## 2016-10-24 ENCOUNTER — Other Ambulatory Visit: Payer: Self-pay | Admitting: Family Medicine

## 2016-10-24 VITALS — BP 146/94 | HR 81 | Temp 97.9°F | Ht 67.0 in | Wt 196.4 lb

## 2016-10-24 DIAGNOSIS — R05 Cough: Secondary | ICD-10-CM

## 2016-10-24 DIAGNOSIS — R059 Cough, unspecified: Secondary | ICD-10-CM

## 2016-10-24 MED ORDER — ALBUTEROL SULFATE HFA 108 (90 BASE) MCG/ACT IN AERS: 2.0000 | INHALATION_SPRAY | Freq: Four times a day (QID) | RESPIRATORY_TRACT | 0 refills | Status: DC | PRN

## 2016-10-24 MED ORDER — DOXYCYCLINE HYCLATE 100 MG PO TABS: 100.0000 mg | ORAL_TABLET | Freq: Two times a day (BID) | ORAL | 0 refills | Status: DC

## 2016-10-24 MED ORDER — ALBUTEROL SULFATE HFA 108 (90 BASE) MCG/ACT IN AERS: 2.0000 | INHALATION_SPRAY | Freq: Four times a day (QID) | RESPIRATORY_TRACT | 2 refills | Status: DC | PRN

## 2016-10-24 NOTE — Patient Instructions (Signed)
Continue mucinex STOP sudafed as likely it is raising your blood pressure  Start doxycycline  Use albuterol every 6 hours for first 24 hours to get good medication into the lungs and loosen congestion; after, you may use as needed and eventually stop all together when cough resolves.  Ensure to take probiotics while on antibiotics and also for 2 weeks after completion. It is important to re-colonize the gut with good bacteria and also to prevent any diarrheal infections associated with antibiotic use.   Referral to pulmonology for further testing.   Smoking cessation as advised.   If there is no improvement in your symptoms, or if there is any worsening of symptoms, or if you have any additional concerns, please return for re-evaluation; or, if we are closed, consider going to the Emergency Room for evaluation if symptoms urgent.

## 2016-10-24 NOTE — Assessment & Plan Note (Signed)
Afebrile. Well appearing and not labored in speech. Sa02 98%. Discussed at length my concerns with smoking and being around welding smoke. No formal lung testing. Referral to pulmonology. Due to worsening nature of symptoms, advised to start doxcycline. Return precautions given

## 2016-10-24 NOTE — Progress Notes (Signed)
Pre visit review using our clinic review tool, if applicable. No additional management support is needed unless otherwise documented below in the visit note. 

## 2016-10-24 NOTE — Telephone Encounter (Signed)
Last filled 25271/29 1 0rf last OV 07/24/16

## 2016-10-24 NOTE — Progress Notes (Signed)
Subjective:    Patient ID: Michael [name], male    DOB: [birth year], 26 y.o.   MRN: [id number]  CC: Michael [name] is a 26 y.o. male who presents today for an acute visit.    HPI:  CC: sinus congestion and productive cough x one week, worsening  Endorses chills, sob, wheezing, dull ear pain on right side, sore throat. No fever,vision changes, chest pain, HA  Uses inhaler without relief.   Tried mucinex, sudafed without relief.  Smoker  Works in Lobbyist  H/o asthma   Seen for cough 06/2016; given Zpak HISTORY:  Past Medical History:  Diagnosis Date  . Allergy   . Anxiety   . Asthma   . Chest pain off and on  . Depression   . Family history of adverse reaction to anesthesia    adopted history unknown  . GERD (gastroesophageal reflux disease)   . Pancreatitis last 2015  . Substance abuse    Past Surgical History:  Procedure Laterality Date  . CHOLECYSTECTOMY  08/16/2015   Procedure: LAPAROSCOPIC CHOLECYSTECTOMY;  Surgeon: Alphonsa Overall, MD;  Location: [facility];  Service: General;;  . ESOPHAGOGASTRODUODENOSCOPY (EGD) WITH PROPOFOL N/A 07/27/2016   Procedure: ESOPHAGOGASTRODUODENOSCOPY (EGD) WITH PROPOFOL;  Surgeon: Milus Banister, MD;  Location: [facility];  Service: Endoscopy;  Laterality: N/A;  . TONSILLECTOMY AND ADENOIDECTOMY  as child   and adenoids   Family History  Problem Relation Age of Onset  . Adopted: Yes  . Diabetes Paternal Grandfather   . Lung cancer Paternal Grandfather   . Ulcers Father   . Colon cancer Neg Hx     Allergies: Bee venom; Quetiapine; Serotonin reuptake inhibitors (ssris); Amoxicillin; Gabapentin; Prednisone; and Latuda [lurasidone hcl] Current Outpatient Prescriptions on File Prior to Visit  Medication Sig Dispense Refill  . pantoprazole (PROTONIX) 40 MG tablet Take 1 tablet (40 mg total) by mouth daily. 30 tablet 3  . ranitidine (ZANTAC) 150 MG tablet Take 1 tablet (150 mg total) by mouth 2 (two) times daily. (Patient taking  differently: Take 150 mg by mouth 2 (two) times daily as needed for heartburn. ) 60 tablet 1  . Skin Protectants, Misc. (CERAVE) OINT Apply 1 application topically daily as needed (itching).     No current facility-administered medications on file prior to visit.     Social History  Substance Use Topics  . Smoking status: Current Every Day Smoker    Packs/day: 1.50    Types: Cigarettes  . Smokeless tobacco: Former Systems developer    Quit date: 07/22/2015  . Alcohol use No     Comment: hx alcolol use quit march 2017    Review of Systems  Constitutional: Positive for chills. Negative for fever.  HENT: Positive for congestion.   Respiratory: Positive for cough.   Cardiovascular: Negative for chest pain and palpitations.  Gastrointestinal: Negative for nausea and vomiting.      Objective:    BP (!) 146/94   Pulse 81   Temp 97.9 F (36.6 C) (Oral)   Ht 5\' 7"  (1.702 m)   Wt 196 lb 6.4 oz (89.1 kg)   SpO2 98%   BMI 30.76 kg/m    Physical Exam  Constitutional: Vital signs are normal. He appears well-developed and well-nourished.  HENT:  Head: Normocephalic and atraumatic.  Right Ear: Hearing, tympanic membrane, external ear and ear canal normal. No drainage, swelling or tenderness. Tympanic membrane is not injected, not erythematous and not bulging. No middle ear effusion. No decreased hearing is  noted.  Left Ear: Hearing, tympanic membrane, external ear and ear canal normal. No drainage, swelling or tenderness. Tympanic membrane is not injected, not erythematous and not bulging.  No middle ear effusion. No decreased hearing is noted.  Nose: Nose normal. Right sinus exhibits no maxillary sinus tenderness and no frontal sinus tenderness. Left sinus exhibits no maxillary sinus tenderness and no frontal sinus tenderness.  Mouth/Throat: Uvula is midline, oropharynx is clear and moist and mucous membranes are normal. No oropharyngeal exudate, posterior oropharyngeal edema, posterior oropharyngeal  erythema or tonsillar abscesses.  Eyes: Conjunctivae are normal.  Cardiovascular: Regular rhythm and normal heart sounds.   Pulmonary/Chest: Effort normal. No respiratory distress. He has wheezes in the right lower field. He has no rhonchi. He has no rales.      Few expiratory wheezes heard RLF - resolved after a couple of deep breaths.  Lymphadenopathy:       Head (right side): No submental, no submandibular, no tonsillar, no preauricular, no posterior auricular and no occipital adenopathy present.       Head (left side): No submental, no submandibular, no tonsillar, no preauricular, no posterior auricular and no occipital adenopathy present.    He has no cervical adenopathy.  Neurological: He is alert.  Skin: Skin is warm and dry.  Psychiatric: He has a normal mood and affect. His speech is normal and behavior is normal.  Vitals reviewed.      Assessment & Plan:   Problem List Items Addressed This Visit      Other   Cough - Primary    Afebrile. Well appearing and not labored in speech. Sa02 98%. Discussed at length my concerns with smoking and being around welding smoke. No formal lung testing. Referral to pulmonology. Due to worsening nature of symptoms, advised to start doxcycline. Return precautions given      Relevant Medications   albuterol (PROVENTIL HFA;VENTOLIN HFA) 108 (90 Base) MCG/ACT inhaler   doxycycline (VIBRA-TABS) 100 MG tablet   Other Relevant Orders   Ambulatory referral to Pulmonology        I am having Michael [name] start on doxycycline. I am also having him maintain his ranitidine, pantoprazole, CERAVE, and albuterol.   Meds ordered this encounter  Medications  . albuterol (PROVENTIL HFA;VENTOLIN HFA) 108 (90 Base) MCG/ACT inhaler    Sig: Inhale 2 puffs into the lungs every 6 (six) hours as needed for wheezing or shortness of breath.    Dispense:  1 Inhaler    Refill:  2    Order Specific Question:   Supervising Provider    Answer:   Derrel Nip, TERESA  L [[id number]]  . doxycycline (VIBRA-TABS) 100 MG tablet    Sig: Take 1 tablet (100 mg total) by mouth 2 (two) times daily.    Dispense:  14 tablet    Refill:  0    Order Specific Question:   Supervising Provider    Answer:   Crecencio Mc [[id number]]    Return precautions given.   Risks, benefits, and alternatives of the medications and treatment plan prescribed today were discussed, and patient expressed understanding.   Education regarding symptom management and diagnosis given to patient on AVS.  Continue to follow with [name], MD for routine health maintenance.   Michael [name] and I agreed with plan.   Mable Paris, FNP

## 2017-01-17 ENCOUNTER — Telehealth: Payer: Self-pay

## 2017-01-17 NOTE — Telephone Encounter (Signed)
Patient has been scheduled with NP.

## 2017-01-17 NOTE — Telephone Encounter (Signed)
Copied from [place]. Topic: Appointment Scheduling - Scheduling Inquiry for Clinic >> Jan 17, 2017  2:00 PM Hewitt Shorts wrote: Reason for CRM: pt is needing to see sonnenberg but he is completely booked and patient is complaining of side pain on his appendix side   Best number

## 2017-01-22 ENCOUNTER — Ambulatory Visit (INDEPENDENT_AMBULATORY_CARE_PROVIDER_SITE_OTHER): Payer: Medicare Other | Admitting: Family Medicine

## 2017-01-22 ENCOUNTER — Other Ambulatory Visit: Payer: Self-pay | Admitting: Family Medicine

## 2017-01-22 ENCOUNTER — Ambulatory Visit
Admission: RE | Admit: 2017-01-22 | Discharge: 2017-01-22 | Disposition: A | Discharge status: Home / Self Care | Payer: Medicare Other | Source: Ambulatory Visit | Attending: Family Medicine | Admitting: Family Medicine

## 2017-01-22 ENCOUNTER — Encounter: Payer: Self-pay | Admitting: Family Medicine

## 2017-01-22 DIAGNOSIS — R1031 Right lower quadrant pain: Secondary | ICD-10-CM

## 2017-01-22 MED ORDER — IOPAMIDOL (ISOVUE-300) INJECTION 61%
100.0000 mL | Freq: Once | INTRAVENOUS | Status: AC | PRN
  Administered 2017-01-22: 100 mL via INTRAVENOUS

## 2017-01-22 NOTE — Progress Notes (Signed)
Subjective:    Patient ID: Michael [name], male    DOB: Jul 13, 1990, 26 y.o.   MRN: [id number]  HPI  Michael [name] is a 26 yo male who presents today with right lower quadrant pain.  Abdominal Pain: Right lower quadrant Location: right sided Onset/Timing: One month ago but has experienced recent sharp pain and cramping over the past weekend.  Duration: intermittent sharp pain and cramping Severity/Quality: 6 and aching or sharp Worse/Better by: Worse with bowel movement or after eating pain will occur one to two hours later but this is not always associated Associated Symptoms:  nausea without vomiting No treatments have been tried at home. He states he follows a high fiber diet with vegetables and fruit.  History of GERD: He reports taking zantac a day that has provided limited benefit. Protonix has provided less benefit so he has stopped protonix and takes zantac solely  Colonoscopy 07/23/15: polyps noted; upper endoscopy noted benign with mild esophageal stenosis.  History noted in chart as patient is a marijuana user and also smokes daily. He has a history of heroin use.  ROS Loss of appetite: Yes Vomiting: No; + for nausea Diarrhea: loose stools; this is not a new finding. He reports loose bowels since gall bladder removal approximately 2 years ago per patient. Rectal bleeding: No Fever: No Weight loss: No  Meds, Vitals, and allergies reviewed   Review of Systems  Constitutional: Negative for chills, fatigue and fever.  Respiratory: Negative for cough, shortness of breath and wheezing.   Cardiovascular: Negative for chest pain and palpitations.  Gastrointestinal: Positive for abdominal pain, diarrhea, nausea and vomiting.  Genitourinary: Negative for dysuria, frequency and hematuria.  Musculoskeletal: Negative for myalgias.  Skin: Negative for rash.  Neurological: Negative for dizziness, weakness, light-headedness and headaches.  Psychiatric/Behavioral:       He  denies depressed or anxious mood today   Past Medical History:  Diagnosis Date  . Allergy   . Anxiety   . Asthma   . Chest pain off and on  . Depression   . Family history of adverse reaction to anesthesia    adopted history unknown  . GERD (gastroesophageal reflux disease)   . Pancreatitis last 2015  . Substance abuse ([place])      Social History   Socioeconomic History  . Marital status: Single    Spouse name: Not on file  . Number of children: 2  . Years of education: Not on file  . Highest education level: Not on file  Social Needs  . Financial resource strain: Not on file  . Food insecurity - worry: Not on file  . Food insecurity - inability: Not on file  . Transportation needs - medical: Not on file  . Transportation needs - non-medical: Not on file  Occupational History  . Not on file  Tobacco Use  . Smoking status: Current Every Day Smoker    Packs/day: 1.50    Types: Cigarettes  . Smokeless tobacco: Former Systems developer    Quit date: 07/22/2015  Substance and Sexual Activity  . Alcohol use: No    Alcohol/week: 0.0 oz    Comment: hx alcolol use quit march 2017  . Drug use: Yes    Types: Marijuana    Comment: heroin, cocaine, acid, pain killers; last use yrs ago marijuana daily use  . Sexual activity: Yes    Partners: Female  Other Topics Concern  . Not on file  Social History Narrative   Lives at home  with parents.      Work - none at present, has worked Lawyer - Completed high school      Hobbies - computers, outdoors, fishing      Diet - regular      Exercise - none    Past Surgical History:  Procedure Laterality Date  . CHOLECYSTECTOMY  08/16/2015   Procedure: LAPAROSCOPIC CHOLECYSTECTOMY;  Surgeon: Alphonsa Overall, MD;  Location: [facility];  Service: General;;  . ESOPHAGOGASTRODUODENOSCOPY (EGD) WITH PROPOFOL N/A 07/27/2016   Procedure: ESOPHAGOGASTRODUODENOSCOPY (EGD) WITH PROPOFOL;  Surgeon: Milus Banister, MD;  [facility];  Service: Endoscopy;  Laterality: N/A;  . TONSILLECTOMY AND ADENOIDECTOMY  as child   and adenoids    Family History  Adopted: Yes  Problem Relation Age of Onset  . Diabetes Paternal Grandfather   . Lung cancer Paternal Grandfather   . Ulcers Father   . Colon cancer Neg Hx     Allergies  Allergen Reactions  . Bee Venom Anaphylaxis  . Quetiapine Rash  . Serotonin Reuptake Inhibitors (Ssris) Rash  . Amoxicillin Diarrhea    Has patient had a PCN reaction causing immediate rash, facial/tongue/throat swelling, SOB or lightheadedness with hypotension: No Has patient had a PCN reaction causing severe rash involving mucus membranes or skin necrosis: No Has patient had a PCN reaction that required hospitalization: No Has patient had a PCN reaction occurring within the last 10 years: Yes If all of the above answers are "NO", then may proceed with Cephalosporin use.   . Gabapentin Nausea And Vomiting  . Prednisone Other (See Comments)    Difficulty breathing  . Anette Guarneri [Lurasidone Hcl] Rash and Nausea And Vomiting    Current Outpatient Medications on File Prior to Visit  Medication Sig Dispense Refill  . albuterol (PROVENTIL HFA;VENTOLIN HFA) 108 (90 Base) MCG/ACT inhaler Inhale 2 puffs into the lungs every 6 (six) hours as needed for wheezing or shortness of breath. 1 Inhaler 2  . doxycycline (VIBRA-TABS) 100 MG tablet Take 1 tablet (100 mg total) by mouth 2 (two) times daily. 14 tablet 0  . pantoprazole (PROTONIX) 40 MG tablet Take 1 tablet (40 mg total) by mouth daily. 30 tablet 3  . ranitidine (ZANTAC) 150 MG tablet Take 1 tablet (150 mg total) by mouth 2 (two) times daily. (Patient taking differently: Take 150 mg by mouth 2 (two) times daily as needed for heartburn. ) 60 tablet 1  . Skin Protectants, Misc. (CERAVE) OINT Apply 1 application topically daily as needed (itching).     No current facility-administered medications on file prior to visit.     BP (!) 124/94    Pulse 89   Temp 98.2 F (36.8 C) (Oral)   Ht 5\' 7"  (1.702 m)   Wt 195 lb (88.5 kg)   SpO2 98%   BMI 30.54 kg/m        Objective:   Physical Exam  Constitutional: He is oriented to person, place, and time. He appears well-developed and well-nourished.  Eyes: Pupils are equal, round, and reactive to light. No scleral icterus.  Neck: Neck supple.  Cardiovascular: Normal rate, regular rhythm and intact distal pulses.  Pulmonary/Chest: Effort normal and breath sounds normal. He has no wheezes. He has no rales.  Abdominal: Soft. There is tenderness in the right lower quadrant. There is rebound.  +Psoas and heel jar test  Musculoskeletal: He exhibits no edema.  Lymphadenopathy:    He has no cervical adenopathy.  Neurological: He is alert and oriented to person, place, and time. Coordination normal.  Skin: Skin is warm and dry. No rash noted.  Psychiatric: He has a normal mood and affect. His behavior is normal. Judgment and thought content normal.      Assessment & Plan:  1. Right lower quadrant abdominal pain Right lower pain that patient reports that has been intermittent and sharp. +Tenderness, and +heel jar test that worsened pain per patient. Will sent for urgent scanning to [facility]. If CT scan is unremarkable; will obtain CBC, BMP, and UA for further evaluation and also advise follow up evaluation with GI provider as abdominal pain has been noted as a chronic issue previously.  - CT Abdomen Pelvis W Contrast; Future

## 2017-01-22 NOTE — Progress Notes (Signed)
Pre visit review using our clinic review tool, if applicable. No additional management support is needed unless otherwise documented below in the visit note. 

## 2017-01-23 ENCOUNTER — Other Ambulatory Visit (INDEPENDENT_AMBULATORY_CARE_PROVIDER_SITE_OTHER): Payer: Medicare Other

## 2017-01-23 DIAGNOSIS — R1031 Right lower quadrant pain: Secondary | ICD-10-CM | POA: Diagnosis not present

## 2017-01-23 LAB — BASIC METABOLIC PANEL
BUN: 7 mg/dL (ref 6–23)
CHLORIDE: 103 meq/L (ref 96–112)
CO2: 30 mEq/L (ref 19–32)
Calcium: 9.5 mg/dL (ref 8.4–10.5)
Creatinine, Ser: 0.81 mg/dL (ref 0.40–1.50)
GFR: 122.39 mL/min (ref >60.00)
Glucose, Bld: 82 mg/dL (ref 70–99)
Potassium: 3.9 mEq/L (ref 3.5–5.1)
Sodium: 141 mEq/L (ref 135–145)

## 2017-01-23 LAB — CBC WITH DIFFERENTIAL/PLATELET
BASOS PCT: 0.7 % (ref 0.0–3.0)
Basophils Absolute: 0.1 10*3/uL (ref 0.0–0.1)
EOS PCT: 8.3 % — AB (ref 0.0–5.0)
Eosinophils Absolute: 0.7 10*3/uL (ref 0.0–0.7)
HEMATOCRIT: 46.1 % (ref 39.0–52.0)
HEMOGLOBIN: 15.6 g/dL (ref 13.0–17.0)
LYMPHS PCT: 28.4 % (ref 12.0–46.0)
Lymphs Abs: 2.5 10*3/uL (ref 0.7–4.0)
MCHC: 33.9 g/dL (ref 30.0–36.0)
MCV: 93.7 fl (ref 78.0–100.0)
Monocytes Absolute: 0.8 10*3/uL (ref 0.1–1.0)
Monocytes Relative: 8.7 % (ref 3.0–12.0)
Neutro Abs: 4.7 10*3/uL (ref 1.4–7.7)
Neutrophils Relative %: 53.9 % (ref 43.0–77.0)
Platelets: 226 10*3/uL (ref 150.0–400.0)
RBC: 4.92 Mil/uL (ref 4.22–5.81)
RDW: 13.8 % (ref 11.5–15.5)
WBC: 8.7 10*3/uL (ref 4.0–10.5)

## 2017-02-07 ENCOUNTER — Encounter: Payer: Self-pay | Admitting: *Deleted

## 2017-03-23 ENCOUNTER — Encounter: Payer: Self-pay | Admitting: Family Medicine

## 2017-03-23 ENCOUNTER — Ambulatory Visit (INDEPENDENT_AMBULATORY_CARE_PROVIDER_SITE_OTHER): Payer: Medicare Other | Admitting: Family Medicine

## 2017-03-23 VITALS — BP 114/80 | HR 76 | Temp 98.6°F | Wt 196.5 lb

## 2017-03-23 DIAGNOSIS — K611 Rectal abscess: Secondary | ICD-10-CM | POA: Diagnosis not present

## 2017-03-23 NOTE — Patient Instructions (Addendum)
I suspect you have an infection of your rectum, please go to the emergency room from here

## 2017-03-23 NOTE — Progress Notes (Signed)
Subjective:    Patient ID: Michael [name], male    DOB: 27-Aug-1990, 27 y.o.   MRN: [id number]  HPI  This is a 27 yo male who presents today with rectal pain and RLQ pain. Rectal pain started several days ago and has gotten worse. He is having a great deal of pain with sitting and straining.  No constipation, loose bowel movement since he had gallbladder removed. Has been avoiding having a bowel movement. No fever.  No blood in stool. No nausea or vomiting. No medication or treatment.  Right lower quadrant pain is chronic, had CT abdomen and pelvis 08/01/16 and 01/22/17 negative.   Past Medical History:  Diagnosis Date  . Allergy   . Anxiety   . Asthma   . Chest pain off and on  . Depression   . Family history of adverse reaction to anesthesia    adopted history unknown  . GERD (gastroesophageal reflux disease)   . Pancreatitis last 2015  . Substance abuse [facility])    Past Surgical History:  Procedure Laterality Date  . CHOLECYSTECTOMY  08/16/2015   Procedure: LAPAROSCOPIC CHOLECYSTECTOMY;  Surgeon: Alphonsa Overall, MD;  Location: [facility];  Service: General;;  . ESOPHAGOGASTRODUODENOSCOPY (EGD) WITH PROPOFOL N/A 07/27/2016   Procedure: ESOPHAGOGASTRODUODENOSCOPY (EGD) WITH PROPOFOL;  Surgeon: Milus Banister, MD;  [facility]: [facility];  Service: Endoscopy;  Laterality: N/A;  . TONSILLECTOMY AND ADENOIDECTOMY  as child   and adenoids   Family History  Adopted: Yes  Problem Relation Age of Onset  . Diabetes Paternal Grandfather   . Lung cancer Paternal Grandfather   . Ulcers Father   . Colon cancer Neg Hx    Social History   Tobacco Use  . Smoking status: Current Every Day Smoker    Packs/day: 1.50    Types: Cigarettes  . Smokeless tobacco: Former Systems developer    Quit date: 07/22/2015  Substance Use Topics  . Alcohol use: No    Alcohol/week: 0.0 oz    Comment: hx alcolol use quit march 2017  . Drug use: Yes    Types: Marijuana    Comment: heroin, cocaine, acid, pain killers; last  use yrs ago marijuana daily use     Review of Systems Per HPI    Objective:   Physical Exam  Constitutional: He is oriented to person, place, and time. He appears well-developed and well-nourished. No distress.  Appears uncomfortable.   HENT:  Head: Normocephalic and atraumatic.  Cardiovascular: Normal rate.  Pulmonary/Chest: Effort normal.  Genitourinary:     Neurological: He is alert and oriented to person, place, and time.  Skin: Skin is warm and dry. He is not diaphoretic.  Psychiatric: He has a normal mood and affect. His behavior is normal. Judgment and thought content normal.  Vitals reviewed.     BP 114/80   Pulse 76   Temp 98.6 F (37 C) (Oral)   Wt 196 lb 8 oz (89.1 kg)   SpO2 98%   BMI 30.78 kg/m  Wt Readings from Last 3 Encounters:  03/23/17 196 lb 8 oz (89.1 kg)  01/22/17 195 lb (88.5 kg)  10/24/16 196 lb 6.4 oz (89.1 kg)       Assessment & Plan:  1. Rectal abscess - unable to get patient in with out patient surgery, he was advised to go to the ER, he verbalized understanding and agreed with plan.    Clarene Reamer, FNP-BC  [facility], [place]  03/23/2017 4:27 PM

## 2017-03-26 ENCOUNTER — Encounter: Payer: Self-pay | Admitting: Emergency Medicine

## 2017-03-26 ENCOUNTER — Emergency Department
Admission: EM | Admit: 2017-03-26 | Discharge: 2017-03-26 | Disposition: A | Discharge status: Home / Self Care | Payer: Medicare Other | Attending: Emergency Medicine | Admitting: Emergency Medicine

## 2017-03-26 ENCOUNTER — Telehealth: Payer: Self-pay | Admitting: Family Medicine

## 2017-03-26 ENCOUNTER — Other Ambulatory Visit: Payer: Self-pay

## 2017-03-26 DIAGNOSIS — J45909 Unspecified asthma, uncomplicated: Secondary | ICD-10-CM | POA: Insufficient documentation

## 2017-03-26 DIAGNOSIS — K6289 Other specified diseases of anus and rectum: Secondary | ICD-10-CM | POA: Diagnosis not present

## 2017-03-26 DIAGNOSIS — R110 Nausea: Secondary | ICD-10-CM | POA: Diagnosis not present

## 2017-03-26 DIAGNOSIS — R0981 Nasal congestion: Secondary | ICD-10-CM | POA: Diagnosis not present

## 2017-03-26 DIAGNOSIS — F17210 Nicotine dependence, cigarettes, uncomplicated: Secondary | ICD-10-CM | POA: Diagnosis not present

## 2017-03-26 LAB — CBC WITH DIFFERENTIAL/PLATELET
BASOS ABS: 0.1 10*3/uL (ref 0–0.1)
BASOS PCT: 1 %
EOS PCT: 9 %
Eosinophils Absolute: 0.8 10*3/uL — ABNORMAL HIGH (ref 0–0.7)
HEMATOCRIT: 45 % (ref 40.0–52.0)
Hemoglobin: 15.2 g/dL (ref 13.0–18.0)
LYMPHS PCT: 30 %
Lymphs Abs: 2.8 10*3/uL (ref 1.0–3.6)
MCH: 31.3 pg (ref 26.0–34.0)
MCHC: 33.8 g/dL (ref 32.0–36.0)
MCV: 92.6 fL (ref 80.0–100.0)
MONO ABS: 0.8 10*3/uL (ref 0.2–1.0)
Monocytes Relative: 8 %
NEUTROS ABS: 4.7 10*3/uL (ref 1.4–6.5)
Neutrophils Relative %: 52 %
PLATELETS: 211 10*3/uL (ref 150–440)
RBC: 4.86 MIL/uL (ref 4.40–5.90)
RDW: 13.4 % (ref 11.5–14.5)
WBC: 9.1 10*3/uL (ref 3.8–10.6)

## 2017-03-26 LAB — COMPREHENSIVE METABOLIC PANEL
ALBUMIN: 4.6 g/dL (ref 3.5–5.0)
ALT: 23 U/L (ref 17–63)
AST: 17 U/L (ref 15–41)
Alkaline Phosphatase: 67 U/L (ref 38–126)
Anion gap: 8 (ref 5–15)
BUN: 17 mg/dL (ref 6–20)
CHLORIDE: 104 mmol/L (ref 101–111)
CO2: 26 mmol/L (ref 22–32)
CREATININE: 0.94 mg/dL (ref 0.61–1.24)
Calcium: 9.2 mg/dL (ref 8.9–10.3)
GFR calc Af Amer: >60 mL/min (ref >60)
GFR calc non Af Amer: >60 mL/min (ref >60)
GLUCOSE: 96 mg/dL (ref 65–99)
POTASSIUM: 4.1 mmol/L (ref 3.5–5.1)
Sodium: 138 mmol/L (ref 135–145)
Total Bilirubin: 0.3 mg/dL (ref 0.3–1.2)
Total Protein: 7.4 g/dL (ref 6.5–8.1)

## 2017-03-26 MED ORDER — LIDOCAINE (ANORECTAL) 5 % EX CREA: TOPICAL_CREAM | CUTANEOUS | 0 refills | Status: DC

## 2017-03-26 NOTE — Telephone Encounter (Signed)
Patient having rectal pain was seen Carlean Purl NP on Friday @ [place] and was advised to go to ER to have lanced patient did not go to ER and is now requesting urgent referral to surgery to have drained infected area to rectum.

## 2017-03-26 NOTE — Discharge Instructions (Signed)
These use your prescribed medication for the next 1 week.  Please follow-up with Dr. Rosana Hoes in 1 week for recheck/reevaluation.  Return to the emergency department for any significant worsening of pain, development of fever, or any other symptom personally concerning to yourself.

## 2017-03-26 NOTE — Consult Note (Signed)
SURGICAL CONSULTATION NOTE (initial) - cpt: 09470  HISTORY OF PRESENT ILLNESS (HPI):  27 y.o. male presented to [facility] ED upon referral by PMD for evaluation of Right-sided perianal pain. Patient reports he first noticed pain and a "ridge" along the Right side of his anus 4.5 days ago (this past Thursday) and saw his PMD the following day. He describes he's had frequent loose BM's 4 days per week since undergoing laparoscopic cholecystectomy at [facility] (2017) and strains with hard BM's the remaining 2-3 days per week. He says cholecystectomy provided relief from his prior Right-sided abdominal pain except GERD-associated symptoms relieved only by Zantac prn. He otherwise denies N/V, fever/chills, blood with BM's, prior similar symptoms, CP, or SOB. Of note, patient says he quit heroin 4 years ago, quit alcohol 2 years ago, has cut back tobacco smoking to 1/2 ppd, and continues marijuana.  Surgery is consulted by ED physician Dr. Kerman Passey in this context for evaluation and management of perianal pain.  PAST MEDICAL HISTORY (PMH):  Past Medical History:  Diagnosis Date  . Allergy   . Anxiety   . Asthma   . Chest pain off and on  . Depression   . Family history of adverse reaction to anesthesia    adopted history unknown  . GERD (gastroesophageal reflux disease)   . Pancreatitis last 2015  . Substance abuse ([place])      PAST SURGICAL HISTORY ([place]):  Past Surgical History:  Procedure Laterality Date  . CHOLECYSTECTOMY  08/16/2015   Procedure: LAPAROSCOPIC CHOLECYSTECTOMY;  Surgeon: Alphonsa Overall, MD;  Location: [facility];  Service: General;;  . ESOPHAGOGASTRODUODENOSCOPY (EGD) WITH PROPOFOL N/A 07/27/2016   Procedure: ESOPHAGOGASTRODUODENOSCOPY (EGD) WITH PROPOFOL;  Surgeon: Milus Banister, MD;  Location: [facility];  Service: Endoscopy;  Laterality: N/A;  . TONSILLECTOMY AND ADENOIDECTOMY  as child   and adenoids     MEDICATIONS:  Prior to Admission medications   Medication  Sig Start Date End Date Taking? Authorizing Provider  albuterol (PROVENTIL HFA;VENTOLIN HFA) 108 (90 Base) MCG/ACT inhaler Inhale 2 puffs into the lungs every 6 (six) hours as needed for wheezing or shortness of breath. 10/24/16   Burnard Hawthorne, FNP  ranitidine (ZANTAC) 150 MG tablet Take 1 tablet (150 mg total) by mouth 2 (two) times daily. Patient taking differently: Take 150 mg by mouth 2 (two) times daily as needed for heartburn.  11/30/15   Willia Craze, NP  Skin Protectants, Misc. (CERAVE) OINT Apply 1 application topically daily as needed (itching).    [provider]     ALLERGIES:  Allergies  Allergen Reactions  . Bee Venom Anaphylaxis  . Quetiapine Rash  . Serotonin Reuptake Inhibitors (Ssris) Rash  . Amoxicillin Diarrhea    Has patient had a PCN reaction causing immediate rash, facial/tongue/throat swelling, SOB or lightheadedness with hypotension: No Has patient had a PCN reaction causing severe rash involving mucus membranes or skin necrosis: No Has patient had a PCN reaction that required hospitalization: No Has patient had a PCN reaction occurring within the last 10 years: Yes If all of the above answers are "NO", then may proceed with Cephalosporin use.   . Gabapentin Nausea And Vomiting  . Prednisone Other (See Comments)    Difficulty breathing  . Latuda [Lurasidone Hcl] Rash and Nausea And Vomiting     SOCIAL HISTORY:  Social History   Socioeconomic History  . Marital status: Single    Spouse name: Not on file  . Number of children: 2  .  Years of education: Not on file  . Highest education level: Not on file  Social Needs  . Financial resource strain: Not on file  . Food insecurity - worry: Not on file  . Food insecurity - inability: Not on file  . Transportation needs - medical: Not on file  . Transportation needs - non-medical: Not on file  Occupational History  . Not on file  Tobacco Use  . Smoking status: Current Every Day Smoker     Packs/day: 0.50    Types: Cigarettes  . Smokeless tobacco: Former Systems developer    Quit date: 07/22/2015  Substance and Sexual Activity  . Alcohol use: No    Alcohol/week: 0.0 oz    Comment: hx alcolol use quit march 2017  . Drug use: Yes    Types: Marijuana    Comment: heroin, cocaine, acid, pain killers; last use yrs ago marijuana daily use  . Sexual activity: Yes    Partners: Female  Other Topics Concern  . Not on file  Social History Narrative   Lives at home with parents.      Work - none at present, has worked Lawyer - Completed high school      Hobbies - computers, outdoors, fishing      Diet - regular      Exercise - none    The patient currently resides (home / rehab facility / nursing home): Home The patient normally is (ambulatory / bedbound): Ambulatory   FAMILY HISTORY:  Family History  Adopted: Yes  Problem Relation Age of Onset  . Diabetes Paternal Grandfather   . Lung cancer Paternal Grandfather   . Ulcers Father   . Colon cancer Neg Hx      REVIEW OF SYSTEMS:  Constitutional: denies weight loss, fever, chills, or sweats  Eyes: denies any other vision changes, history of eye injury  ENT: denies sore throat, hearing problems  Respiratory: denies shortness of breath, wheezing  Cardiovascular: denies chest pain, palpitations  Gastrointestinal: abdominal and perianal pain, N/V, and bowel function as per HPI Genitourinary: denies burning with urination or urinary frequency Musculoskeletal: denies any other joint pains or cramps  Skin: denies any other rashes or skin discolorations  Neurological: denies any other headache, dizziness, weakness  Psychiatric: denies any other depression, anxiety   All other review of systems were negative   VITAL SIGNS:  Temp:  [98.2 F (36.8 C)] 98.2 F (36.8 C) (02/04 1440) Pulse Rate:  [92] 92 (02/04 1440) Resp:  [20] 20 (02/04 1440) BP: (129)/(90) 129/90 (02/04 1440) SpO2:  [97 %] 97 % (02/04  1440) Weight:  [196 lb (88.9 kg)] 196 lb (88.9 kg) (02/04 1442)     Height: 5\' 7"  (170.2 cm) Weight: 196 lb (88.9 kg) BMI (Calculated): 30.69   INTAKE/OUTPUT:  This shift: No intake/output data recorded.  Last 2 shifts: @IOLAST2SHIFTS @   PHYSICAL EXAM:  Constitutional:  -- Normal body habitus  -- Awake, alert, and oriented x3, no apparent distress Eyes:  -- Pupils equally round and reactive to light  -- No scleral icterus, B/L no occular discharge Ear, nose, throat: -- Neck is FROM WNL -- No jugular venous distension  Pulmonary:  -- No wheezes or rhales -- Equal breath sounds bilaterally -- Breathing non-labored at rest Cardiovascular:  -- S1, S2 present  -- No pericardial rubs  Gastrointestinal:  -- Abdomen soft, nontender, non-distended, no guarding or rebound tenderness -- No abdominal masses appreciated, pulsatile or otherwise  Anorectal: -- No appreciable fluctuance or erythema, though exquisite Right perianal tenderness to palpation with possible subtle ulceration of anoderm without mucosal fissure(s), sphincter tone WNL, no gross blood or intra-rectal tenderness to palpation Musculoskeletal and Integumentary:  -- Wounds or skin discoloration: None appreciated except as described above (anorectal) -- Extremities: B/L UE and LE FROM, hands and feet warm, no edema  Neurologic:  -- Motor function: Intact and symmetric -- Sensation: Intact and symmetric Psychiatric:  -- Mood and affect WNL  Labs:  CBC Latest Ref Rng & Units 03/26/2017 01/23/2017 07/25/2016  WBC 3.8 - 10.6 K/uL 9.1 8.7 14.0(H)  Hemoglobin 13.0 - 18.0 g/dL 15.2 15.6 15.3  Hematocrit 40.0 - 52.0 % 45.0 46.1 45.0  Platelets 150 - 440 K/uL 211 226.0 241.0   CMP Latest Ref Rng & Units 03/26/2017 01/23/2017 07/25/2016  Glucose 65 - 99 mg/dL 96 82 96  BUN 6 - 20 mg/dL 17 7 5(L)  Creatinine 0.61 - 1.24 mg/dL 0.94 0.81 0.92  Sodium 135 - 145 mmol/L 138 141 140  Potassium 3.5 - 5.1 mmol/L 4.1 3.9 3.9  Chloride 101 -  111 mmol/L 104 103 107  CO2 22 - 32 mmol/L 26 30 26   Calcium 8.9 - 10.3 mg/dL 9.2 9.5 9.6  Total Protein 6.5 - 8.1 g/dL 7.4 - 7.4  Total Bilirubin 0.3 - 1.2 mg/dL 0.3 - 0.4  Alkaline Phos 38 - 126 U/L 67 - 82  AST 15 - 41 U/L 17 - 21  ALT 17 - 63 U/L 23 - 33   Imaging studies: No new pertinent imaging studies available for review  Assessment/Plan: (ICD-10's: K65.89) 27 y.o. male with perianal pain, possibly attributable to subtle/possible Right perianal ulceration or non-palpable peri-anorectal abscess (less likely) with WBC WNL and complicated by frequent alternating constipation and diarrhea s/p cholecystectomy, along with comorbidities including GERD, asthma, polysubstance abuse, generalized anxiety disorder, and major depression disorder.   - consider CT imaging to evaluate for non-palpable peri-anorectal abscess  - maintain hydration, high-fiber diet, and moderate fat content of foods to minimize constipation and diarrhea  - if no peri-anorectal abscess, recommend topical lidocaine cream (Recticare, etc) and sitz baths  - patient applauded/recognized for abstinence from heroin and alcohol  - outpatient surgical follow-up in 1 - 2 weeks   - smoking cessation discussed, advised  All of the above findings and recommendations were discussed with the patient and ED physician, and all of patient's questions were answered to his expressed satisfaction.  Thank you for the opportunity to participate in this patient's care.   -- Marilynne Drivers Rosana Hoes, MD, [place]: [facility] - Partnering for exceptional care. Office: [phone]

## 2017-03-26 NOTE — Telephone Encounter (Signed)
Spoke with Dr Caryl Bis and he advised patient to go to ER to be evaluated.   Also informed patients mother that Dr Caryl Bis doesn't perform this type of procedure in the office.   Advised patient mother per DPR that patient needed to go to ER to be evaluated and he they could take care of abscess and help patient be comfortable .  Informed patients mother that there is no guarantee that patient would be able to get into surgical office today and have procedure done.   Patients mother would see if she could convince patient to go to ER to be evaluated.  I informed her that I would follow chart to see if went to ER to be evaulated.  If he refused to get  Evaluated  he would need to contact [facility] to be referred to general surgery .  Or Dr Caryl Bis go refer to general surgeon.

## 2017-03-26 NOTE — Telephone Encounter (Signed)
FYI to Glenda Chroman FNP.

## 2017-03-26 NOTE — ED Notes (Signed)
Pt ambulatory without difficulty to POV. VSS. NAD. Discharge instructions, RX, and follow up reviewed. All questions answered.

## 2017-03-26 NOTE — Telephone Encounter (Signed)
Spoke with mom Michael [name] [phone]  needs referral to General surgeon .  Still in a lot of pain form rectal abscess, helps to lay down, no drainage .

## 2017-03-26 NOTE — Telephone Encounter (Signed)
Copied from [place] [id number]. Topic: General - Other >> Mar 26, 2017  8:49 AM Cecelia Byars, NT wrote: Reason for CRM:  Patients mom called and would like to know if rectal abccess can be drained  at the practice if not he would like a referral to have it done please advise  [phone] , he was told to go to the ed on Friday ,did not go

## 2017-03-26 NOTE — Telephone Encounter (Signed)
I spoke with Benjamine Mola [facility] signed) and pt did not go to ED after seeing Glenda Chroman FNP on 03/23/17. Did warm transfer to [facility] for [employer] with [place] at [facility].

## 2017-03-26 NOTE — ED Triage Notes (Signed)
Rectal pain x13 days. Denies injury. States was seen at MD office and sent here for possible rectal abscess.

## 2017-03-26 NOTE — ED Provider Notes (Signed)
[facility] Emergency Department Provider Note  Time seen: 5:51 PM  I have reviewed the triage vital signs and the nursing notes.   HISTORY  Chief Complaint Rectal Pain    HPI Michael [name] is a 27 y.o. male with a past medical history of reflux who presents to the emergency department for rectal pain.  According to the patient for the past 12-13 days he has had significant rectal pain on the right side of his anus.  States significant pain with ambulating or attempted bowel movements.  Denies any fever, states nausea but denies vomiting.  Mild abdominal cramping.  Denies diarrhea black or bloody stool.  No dysuria.  No history of rectal abscess in the past.  No history of Crohn's or ulcerative colitis per patient.  States he is adopted and does not know his family history.  Describes the pain as moderate, severe with attempted bowel movement or walking.   Past Medical History:  Diagnosis Date  . Allergy   . Anxiety   . Asthma   . Chest pain off and on  . Depression   . Family history of adverse reaction to anesthesia    adopted history unknown  . GERD (gastroesophageal reflux disease)   . Pancreatitis last 2015  . Substance abuse [facility])     Patient Active Problem List   Diagnosis Date Noted  . Dysphagia   . Benign esophageal stricture   . GERD (gastroesophageal reflux disease) 07/20/2016  . Cough 06/22/2016  . Colon polyps 03/13/2016  . Hypertension 01/26/2016  . Chest pain 01/26/2016  . Palpitations 01/26/2016  . Anxiety 11/19/2015  . Chronic diarrhea 11/19/2015  . Syncope 11/19/2015  . Bronchitis 10/26/2015  . Hand injury 10/18/2015  . Abdominal pain 06/21/2015  . Depressed 06/21/2015  . Substance induced mood disorder ([place]) 10/09/2014  . Alcohol abuse 10/09/2014  . Marijuana abuse 10/09/2014  . Bipolar disorder ([place]) 10/09/2014    Past Surgical History:  Procedure Laterality Date  . CHOLECYSTECTOMY  08/16/2015   Procedure:  LAPAROSCOPIC CHOLECYSTECTOMY;  Surgeon: Alphonsa Overall, MD;  Location: [facility];  Service: General;;  . ESOPHAGOGASTRODUODENOSCOPY (EGD) WITH PROPOFOL N/A 07/27/2016   Procedure: ESOPHAGOGASTRODUODENOSCOPY (EGD) WITH PROPOFOL;  Surgeon: Milus Banister, MD;  Location: [facility];  Service: Endoscopy;  Laterality: N/A;  . TONSILLECTOMY AND ADENOIDECTOMY  as child   and adenoids    Prior to Admission medications   Medication Sig Start Date End Date Taking? Authorizing Provider  albuterol (PROVENTIL HFA;VENTOLIN HFA) 108 (90 Base) MCG/ACT inhaler Inhale 2 puffs into the lungs every 6 (six) hours as needed for wheezing or shortness of breath. 10/24/16   Burnard Hawthorne, FNP  ranitidine (ZANTAC) 150 MG tablet Take 1 tablet (150 mg total) by mouth 2 (two) times daily. Patient taking differently: Take 150 mg by mouth 2 (two) times daily as needed for heartburn.  11/30/15   Willia Craze, NP  Skin Protectants, Misc. (CERAVE) OINT Apply 1 application topically daily as needed (itching).    [provider]    Allergies  Allergen Reactions  . Bee Venom Anaphylaxis  . Quetiapine Rash  . Serotonin Reuptake Inhibitors (Ssris) Rash  . Amoxicillin Diarrhea    Has patient had a PCN reaction causing immediate rash, facial/tongue/throat swelling, SOB or lightheadedness with hypotension: No Has patient had a PCN reaction causing severe rash involving mucus membranes or skin necrosis: No Has patient had a PCN reaction that required hospitalization: No Has patient had a PCN  reaction occurring within the last 10 years: Yes If all of the above answers are "NO", then may proceed with Cephalosporin use.   . Gabapentin Nausea And Vomiting  . Prednisone Other (See Comments)    Difficulty breathing  . Anette Guarneri [Lurasidone Hcl] Rash and Nausea And Vomiting    Family History  Adopted: Yes  Problem Relation Age of Onset  . Diabetes Paternal Grandfather   . Lung cancer Paternal Grandfather   . Ulcers  Father   . Colon cancer Neg Hx     Social History Social History   Tobacco Use  . Smoking status: Current Every Day Smoker    Packs/day: 0.50    Types: Cigarettes  . Smokeless tobacco: Former Systems developer    Quit date: 07/22/2015  Substance Use Topics  . Alcohol use: No    Alcohol/week: 0.0 oz    Comment: hx alcolol use quit march 2017  . Drug use: Yes    Types: Marijuana    Comment: heroin, cocaine, acid, pain killers; last use yrs ago marijuana daily use    Review of Systems Constitutional: Negative for fever. Eyes: Negative for visual complaints ENT: States mild congestion times 1 month Cardiovascular: Negative for chest pain. Respiratory: Negative for shortness of breath. Gastrointestinal: Mild abdominal cramping with nausea.  No focal pain.  Denies diarrhea, black or bloody stool. Genitourinary: Negative for urinary compaints Musculoskeletal: Negative for musculoskeletal complaints Skin: Negative for skin complaints  Neurological: Negative for headache All other ROS negative  ____________________________________________   PHYSICAL EXAM:  VITAL SIGNS: ED Triage Vitals  Enc Vitals Group     BP 03/26/17 1440 129/90     Pulse Rate 03/26/17 1440 92     Resp 03/26/17 1440 20     Temp 03/26/17 1440 98.2 F (36.8 C)     Temp Source 03/26/17 1440 Oral     SpO2 03/26/17 1440 97 %     Weight 03/26/17 1442 196 lb (88.9 kg)     Height 03/26/17 1442 5\' 7"  (1.702 m)     Head Circumference --      Peak Flow --      Pain Score 03/26/17 1441 8     Pain Loc --      Pain Edu? --      Excl. in [place]? --    Constitutional: Alert and oriented. Well appearing and in no distress. Eyes: Normal exam ENT   Head: Normocephalic and atraumatic.   Mouth/Throat: Mucous membranes are moist. Cardiovascular: Normal rate, regular rhythm. No murmur Respiratory: Normal respiratory effort without tachypnea nor retractions. Breath sounds are clear  Gastrointestinal: Soft, slight diffuse  tenderness, without any focal tenderness identified.  No rebound or guarding.  No distention. Musculoskeletal: Nontender with normal range of motion in all extremities.  Neurologic:  Normal speech and language. No gross focal neurologic deficits  Skin:  Skin is warm, dry and intact.  Psychiatric: Mood and affect are normal.   ____________________________________________   INITIAL IMPRESSION / ASSESSMENT AND PLAN / ED COURSE  Pertinent labs & imaging results that were available during my care of the patient were reviewed by me and considered in my medical decision making (see chart for details).  Patient presents to the emergency department for possible rectal abscess.  On exam patient has skin tenderness to the left side of his anus.  But no obvious mass, no obvious fluctuance or significant fluctuance.  Nothing seen from the external exam consistent with abscess.  With insertive rectal exam patient  does have significant pain to the left side of the anus but again no obvious sign of abscess. I discussed patient with Dr. Rosana Hoes of general surgery who will be down to see the patient to examine.  Dr. Rosana Hoes has seen the patient, agrees no definitive abscess but the patient does have significant tenderness with exam.  He states he could possibly have a little mucosal ulceration.  He recommends using Recticaide.  I discussed with the patient the pros and cons of CT imaging to further evaluate, patient states he has had multiple CT scans in the past and wishes to hold off on CT imaging at this time.  We will discharge with rectal lidocaine.  Dr. Shanon Brow states he will see the patient in the office in 1 week for reevaluation.  I discussed return precautions for any increased pain, swelling, or development of fever.  Patient agreeable to this plan of care.  ____________________________________________   FINAL CLINICAL IMPRESSION(S) / ED DIAGNOSES  Rectal pain    Harvest Dark, MD 03/26/17  [phone]

## 2017-04-05 ENCOUNTER — Ambulatory Visit (INDEPENDENT_AMBULATORY_CARE_PROVIDER_SITE_OTHER): Payer: Medicare Other | Admitting: Surgery

## 2017-04-05 ENCOUNTER — Encounter: Payer: Self-pay | Admitting: Surgery

## 2017-04-05 VITALS — BP 120/65 | HR 86 | Temp 97.7°F | Ht 67.0 in | Wt 200.0 lb

## 2017-04-05 DIAGNOSIS — K602 Anal fissure, unspecified: Secondary | ICD-10-CM | POA: Diagnosis not present

## 2017-04-05 NOTE — Patient Instructions (Signed)
We will not need to see you back in office at this time.  However we advice that you try Metamucil in place of fiber. If this does not help please follow up with your GI doctor.  Listed below are some helpful tips for Constipation and Diarrhea:    Constipation, Adult Constipation is when a person:  Poops (has a bowel movement) fewer times in a week than normal.  Has a hard time pooping.  Has poop that is dry, hard, or bigger than normal.  Follow these instructions at home: Eating and drinking   Eat foods that have a lot of fiber, such as: ? Fresh fruits and vegetables. ? Whole grains. ? Beans.  Eat less of foods that are high in fat, low in fiber, or overly processed, such as: ? [place] fries. ? Hamburgers. ? Cookies. ? Candy. ? Soda.  Drink enough fluid to keep your pee (urine) clear or pale yellow. General instructions  Exercise regularly or as told by your doctor.  Go to the restroom when you feel like you need to poop. Do not hold it in.  Take over-the-counter and prescription medicines only as told by your doctor. These include any fiber supplements.  Do pelvic floor retraining exercises, such as: ? Doing deep breathing while relaxing your lower belly (abdomen). ? Relaxing your pelvic floor while pooping.  Watch your condition for any changes.  Keep all follow-up visits as told by your doctor. This is important. Contact a doctor if:  You have pain that gets worse.  You have a fever.  You have not pooped for 4 days.  You throw up (vomit).  You are not hungry.  You lose weight.  You are bleeding from the anus.  You have thin, pencil-like poop (stool). Get help right away if:  You have a fever, and your symptoms suddenly get worse.  You leak poop or have blood in your poop.  Your belly feels hard or bigger than normal (is bloated).  You have very bad belly pain.  You feel dizzy or you faint. This information is not intended to replace  advice given to you by your health care provider. Make sure you discuss any questions you have with your health care provider. Document Released: 07/26/2007 Document Revised: 08/27/2015 Document Reviewed: 07/28/2015 Elsevier Interactive Patient Education  2018 [employer].    Diarrhea, Adult Diarrhea is when you have loose and water poop (stool) often. Diarrhea can make you feel weak and cause you to get dehydrated. Dehydration can make you tired and thirsty, make you have a dry mouth, and make it so you pee (urinate) less often. Diarrhea often lasts 2-3 days. However, it can last longer if it is a sign of something more serious. It is important to treat your diarrhea as told by your doctor. Follow these instructions at home: Eating and drinking  Follow these recommendations as told by your doctor:  Take an oral rehydration solution (ORS). This is a drink that is sold at pharmacies and stores.  Drink clear fluids, such as: ? Water. ? Ice chips. ? Diluted fruit juice. ? Low-calorie sports drinks.  Eat bland, easy-to-digest foods in small amounts as you are able. These foods include: ? Bananas. ? Applesauce. ? Rice. ? Low-fat (lean) meats. ? Toast. ? Crackers.  Avoid drinking fluids that have a lot of sugar or caffeine in them.  Avoid alcohol.  Avoid spicy or fatty foods.  General instructions   Drink enough fluid to keep your  pee (urine) clear or pale yellow.  Wash your hands often. If you cannot use soap and water, use hand sanitizer.  Make sure that all people in your home wash their hands well and often.  Take over-the-counter and prescription medicines only as told by your doctor.  Rest at home while you get better.  Watch your condition for any changes.  Take a warm bath to help with any burning or pain from having diarrhea.  Keep all follow-up visits as told by your doctor. This is important. Contact a doctor if:  You have a fever.  Your diarrhea gets  worse.  You have new symptoms.  You cannot keep fluids down.  You feel light-headed or dizzy.  You have a headache.  You have muscle cramps. Get help right away if:  You have chest pain.  You feel very weak or you pass out (faint).  You have bloody or black poop or poop that look like tar.  You have very bad pain, cramping, or bloating in your belly (abdomen).  You have trouble breathing or you are breathing very quickly.  Your heart is beating very quickly.  Your skin feels cold and clammy.  You feel confused.  You have signs of dehydration, such as: ? Dark pee, hardly any pee, or no pee. ? Cracked lips. ? Dry mouth. ? Sunken eyes. ? Sleepiness. ? Weakness. This information is not intended to replace advice given to you by your health care provider. Make sure you discuss any questions you have with your health care provider. Document Released: 07/26/2007 Document Revised: 08/27/2015 Document Reviewed: 10/13/2014 Elsevier Interactive Patient Education  2018 [employer].

## 2017-04-05 NOTE — Progress Notes (Signed)
Surgical Clinic Progress/Follow-up Note   HPI:  27 y.o. Male presents to clinic for follow-up evaluation of his peri-anal pain. Patient reports the topical lidocaine cream and dietary changes appear to have helped, and he describes he no longer feels the perianal "ridge" he previously reported, denies abdominal pain, N/V, fever/chills, CP, or SOB. He does still say he has alternated between diarrhea and constipation since undergoing cholecystectomy, at least partially depending on what he eats.  Review of Systems:  Constitutional: denies any other weight loss, fever, chills, or sweats  Eyes: denies any other vision changes, history of eye injury  ENT: denies sore throat, hearing problems  Respiratory: denies shortness of breath, wheezing  Cardiovascular: denies chest pain, palpitations  Gastrointestinal: abdominal pain, N/V, and bowel function as per HPI Musculoskeletal: denies any other joint pains or cramps  Skin: Denies any other rashes or skin discolorations  Neurological: denies any other headache, dizziness, weakness  Psychiatric: denies any other depression, anxiety  All other review of systems: otherwise negative   Vital Signs:  BP 120/65   Pulse 86   Temp 97.7 F (36.5 C) (Oral)   Ht 5\' 7"  (1.702 m)   Wt 200 lb (90.7 kg)   BMI 31.32 kg/m    Physical Exam:  Constitutional:  -- Normal body habitus  -- Awake, alert, and oriented x3  Eyes:  -- Pupils equally round and reactive to light  -- No scleral icterus  Ear, nose, throat:  -- No jugular venous distension  -- No nasal drainage, bleeding Pulmonary:  -- No crackles -- Equal breath sounds bilaterally -- Breathing non-labored at rest Cardiovascular:  -- S1, S2 present  -- No pericardial rubs  Gastrointestinal:  -- Soft, nontender, non-distended, no guarding/rebound  -- No abdominal masses appreciated, pulsatile or otherwise Anorectal: -- Nearly resolved anal fissure/ulceration, no longer tender to  palpation -- Anal sphincter tone WNL and no gross blood, no fistula, no prolapsed hemorrhoids Musculoskeletal / Integumentary:  -- Wounds or skin discoloration: None appreciated except as above (anorectal)  -- Extremities: B/L UE and LE FROM, hands and feet warm, no edema  Neurologic:  -- Motor function: intact and symmetric  -- Sensation: intact and symmetric   Assessment:  27 y.o. yo Male with a problem list including...  Patient Active Problem List   Diagnosis Date Noted  . Dysphagia   . Benign esophageal stricture   . GERD (gastroesophageal reflux disease) 07/20/2016  . Cough 06/22/2016  . Colon polyps 03/13/2016  . Hypertension 01/26/2016  . Chest pain 01/26/2016  . Palpitations 01/26/2016  . Anxiety 11/19/2015  . Chronic diarrhea 11/19/2015  . Syncope 11/19/2015  . Bronchitis 10/26/2015  . Hand injury 10/18/2015  . Abdominal pain 06/21/2015  . Depressed 06/21/2015  . Substance induced mood disorder ([place]) 10/09/2014  . Alcohol abuse 10/09/2014  . Marijuana abuse 10/09/2014  . Bipolar disorder ([place]) 10/09/2014  . Psychosis ([place]) 06/03/2012    presents to clinic for follow-up evaluation of anal fissure, doing well with topical lidocaine and dietary modification.  Plan:   - continue dietary modification to minimize constipation (and diarrhea)   - may benefit from GI consultation/referral to help normalize bowel function  - also discussed with patient surgical management if anal fissure recurs/persists  - return to clinic as needed, instructed to call office if any questions or concerns  - smoking cessation again encouraged and discussed  - applauded for polysubstance cessation  All of the above recommendations were discussed with the patient, and all  of patient's questions were answered to his expressed satisfaction.  -- Marilynne Drivers Rosana Hoes, MD, [place]: [facility] - Partnering for exceptional care. Office: [phone]

## 2017-04-06 ENCOUNTER — Encounter: Payer: Self-pay | Admitting: Surgery

## 2017-04-06 DIAGNOSIS — K602 Anal fissure, unspecified: Secondary | ICD-10-CM

## 2017-04-06 HISTORY — DX: Anal fissure, unspecified: K60.2

## 2017-08-06 ENCOUNTER — Telehealth: Payer: Self-pay

## 2017-08-06 NOTE — Telephone Encounter (Signed)
I am happy to refer him, though please find out if he is having any symptoms. Is he having pain, absence of bowel movements, nausea, vomiting, or any other symptoms? Thanks.

## 2017-08-06 NOTE — Telephone Encounter (Signed)
Copied from [place] [id number]. Topic: Referral - Request >> Aug 06, 2017  3:34 PM Scherrie Gerlach wrote: Reason for CRM: mom calling to ask if Dr Josephina Gip will refer pt for his hiatal hernia. Mom wants to go to Dr Tama High general surgery

## 2017-08-06 NOTE — Telephone Encounter (Signed)
Please advise 

## 2017-08-07 NOTE — Telephone Encounter (Signed)
I agree that he needs to be evaluated today and agree with your other documentation.  Thank you for informing him of this.

## 2017-08-07 NOTE — Telephone Encounter (Signed)
I have informed patient he will need to be evaluated today. Patient states he would rather have an appointment and wait a couple days. I have scheduled patient with Dr.Mclean on Thursday but informed him to go to ER if symptoms worsen and he develops new symptoms. Patient verbalized understanding.

## 2017-08-07 NOTE — Telephone Encounter (Signed)
Patient states he is having left mid abdominal pain, nausea and vomiting at times. Patient states when his reflux gets bad he starts vomiting. Patient states he has been experiencing the pain, nausea and vomiting for about 3 days.

## 2017-08-09 ENCOUNTER — Encounter: Payer: Self-pay | Admitting: Internal Medicine

## 2017-08-09 ENCOUNTER — Ambulatory Visit (INDEPENDENT_AMBULATORY_CARE_PROVIDER_SITE_OTHER): Payer: Medicare Other | Admitting: Internal Medicine

## 2017-08-09 VITALS — BP 130/76 | HR 65 | Temp 98.3°F | Ht 67.0 in | Wt 198.4 lb

## 2017-08-09 DIAGNOSIS — R059 Cough, unspecified: Secondary | ICD-10-CM

## 2017-08-09 DIAGNOSIS — R05 Cough: Secondary | ICD-10-CM

## 2017-08-09 DIAGNOSIS — R140 Abdominal distension (gaseous): Secondary | ICD-10-CM

## 2017-08-09 DIAGNOSIS — K449 Diaphragmatic hernia without obstruction or gangrene: Secondary | ICD-10-CM

## 2017-08-09 DIAGNOSIS — K219 Gastro-esophageal reflux disease without esophagitis: Secondary | ICD-10-CM

## 2017-08-09 DIAGNOSIS — Z1283 Encounter for screening for malignant neoplasm of skin: Secondary | ICD-10-CM

## 2017-08-09 DIAGNOSIS — R1084 Generalized abdominal pain: Secondary | ICD-10-CM | POA: Diagnosis not present

## 2017-08-09 DIAGNOSIS — J4520 Mild intermittent asthma, uncomplicated: Secondary | ICD-10-CM | POA: Insufficient documentation

## 2017-08-09 DIAGNOSIS — D229 Melanocytic nevi, unspecified: Secondary | ICD-10-CM

## 2017-08-09 DIAGNOSIS — R112 Nausea with vomiting, unspecified: Secondary | ICD-10-CM | POA: Diagnosis not present

## 2017-08-09 LAB — COMPREHENSIVE METABOLIC PANEL
ALK PHOS: 69 U/L (ref 39–117)
ALT: 20 U/L (ref 0–53)
AST: 15 U/L (ref 0–37)
Albumin: 4.7 g/dL (ref 3.5–5.2)
BUN: 13 mg/dL (ref 6–23)
CO2: 25 mEq/L (ref 19–32)
Calcium: 9.7 mg/dL (ref 8.4–10.5)
Chloride: 106 mEq/L (ref 96–112)
Creatinine, Ser: 0.95 mg/dL (ref 0.40–1.50)
GFR: 101.4 mL/min (ref >60.00)
GLUCOSE: 102 mg/dL — AB (ref 70–99)
Potassium: 4 mEq/L (ref 3.5–5.1)
SODIUM: 139 meq/L (ref 135–145)
TOTAL PROTEIN: 7.3 g/dL (ref 6.0–8.3)
Total Bilirubin: 0.3 mg/dL (ref 0.2–1.2)

## 2017-08-09 LAB — CBC WITH DIFFERENTIAL/PLATELET
BASOS ABS: 0.1 10*3/uL (ref 0.0–0.1)
Basophils Relative: 1.1 % (ref 0.0–3.0)
Eosinophils Absolute: 0.8 10*3/uL — ABNORMAL HIGH (ref 0.0–0.7)
Eosinophils Relative: 9.6 % — ABNORMAL HIGH (ref 0.0–5.0)
HCT: 45.5 % (ref 39.0–52.0)
Hemoglobin: 15.7 g/dL (ref 13.0–17.0)
LYMPHS ABS: 3 10*3/uL (ref 0.7–4.0)
Lymphocytes Relative: 34.8 % (ref 12.0–46.0)
MCHC: 34.5 g/dL (ref 30.0–36.0)
MCV: 94.1 fl (ref 78.0–100.0)
MONO ABS: 0.8 10*3/uL (ref 0.1–1.0)
Monocytes Relative: 9.5 % (ref 3.0–12.0)
NEUTROS PCT: 45 % (ref 43.0–77.0)
Neutro Abs: 3.9 10*3/uL (ref 1.4–7.7)
Platelets: 215 10*3/uL (ref 150.0–400.0)
RBC: 4.84 Mil/uL (ref 4.22–5.81)
RDW: 13.5 % (ref 11.5–15.5)
WBC: 8.6 10*3/uL (ref 4.0–10.5)

## 2017-08-09 MED ORDER — ONDANSETRON HCL 4 MG PO TABS: 4.0000 mg | ORAL_TABLET | Freq: Three times a day (TID) | ORAL | 2 refills | Status: DC | PRN

## 2017-08-09 MED ORDER — SUCRALFATE 1 GM/10ML PO SUSP: 1.0000 g | Freq: Three times a day (TID) | ORAL | 2 refills | Status: DC

## 2017-08-09 MED ORDER — DEXLANSOPRAZOLE 60 MG PO CPDR: 60.0000 mg | DELAYED_RELEASE_CAPSULE | Freq: Every day | ORAL | 1 refills | Status: DC

## 2017-08-09 MED ORDER — ALBUTEROL SULFATE HFA 108 (90 BASE) MCG/ACT IN AERS: 1.0000 | INHALATION_SPRAY | Freq: Four times a day (QID) | RESPIRATORY_TRACT | 11 refills | Status: DC | PRN

## 2017-08-09 NOTE — Progress Notes (Signed)
Pre visit review using our clinic review tool, if applicable. No additional management support is needed unless otherwise documented below in the visit note. 

## 2017-08-09 NOTE — Patient Instructions (Addendum)
Dexilant is proton pump inhibitor in the am  Carafate/Sulcrafate with every meal and bedtime  Referred to [facility], for US abdomen and barium swallow  Try zofran for nausea  Use albuterol inhaler as needed 1-2 puffs every 4-6 hours please stop smoking   Consider Dr. Hassell Done ([facility]) or Dr. Bary Castilla if hernia needs repair   Gastroesophageal Reflux Disease, Adult Normally, food travels down the esophagus and stays in the stomach to be digested. If a person has gastroesophageal reflux disease (GERD), food and stomach acid move back up into the esophagus. When this happens, the esophagus becomes sore and swollen (inflamed). Over time, GERD can make small holes (ulcers) in the lining of the esophagus. Follow these instructions at home: Diet  Follow a diet as told by your doctor. You may need to avoid foods and drinks such as: ? Coffee and tea (with or without caffeine). ? Drinks that contain alcohol. ? Energy drinks and sports drinks. ? Carbonated drinks or sodas. ? Chocolate and cocoa. ? Peppermint and mint flavorings. ? Garlic and onions. ? Horseradish. ? Spicy and acidic foods, such as peppers, chili powder, curry powder, vinegar, hot sauces, and BBQ sauce. ? Citrus fruit juices and citrus fruits, such as oranges, lemons, and limes. ? Tomato-based foods, such as red sauce, chili, salsa, and pizza with red sauce. ? Fried and fatty foods, such as donuts, french fries, potato chips, and high-fat dressings. ? High-fat meats, such as hot dogs, rib eye steak, sausage, ham, and bacon. ? High-fat dairy items, such as whole milk, butter, and cream cheese.  Eat small meals often. Avoid eating large meals.  Avoid drinking large amounts of liquid with your meals.  Avoid eating meals during the 2-3 hours before bedtime.  Avoid lying down right after you eat.  Do not exercise right after you eat. General instructions  Pay attention to any changes in your symptoms.  Take over-the-counter and  prescription medicines only as told by your doctor. Do not take aspirin, ibuprofen, or other NSAIDs unless your doctor says it is okay.  Do not use any tobacco products, including cigarettes, chewing tobacco, and e-cigarettes. If you need help quitting, ask your doctor.  Wear loose clothes. Do not wear anything tight around your waist.  Raise (elevate) the head of your bed about 6 inches (15 cm).  Try to lower your stress. If you need help doing this, ask your doctor.  If you are overweight, lose an amount of weight that is healthy for you. Ask your doctor about a safe weight loss goal.  Keep all follow-up visits as told by your doctor. This is important. Contact a doctor if:  You have new symptoms.  You lose weight and you do not know why it is happening.  You have trouble swallowing, or it hurts to swallow.  You have wheezing or a cough that keeps happening.  Your symptoms do not get better with treatment.  You have a hoarse voice. Get help right away if:  You have pain in your arms, neck, jaw, teeth, or back.  You feel sweaty, dizzy, or light-headed.  You have chest pain or shortness of breath.  You throw up (vomit) and your throw up looks like blood or coffee grounds.  You pass out (faint).  Your poop (stool) is bloody or black.  You cannot swallow, drink, or eat. This information is not intended to replace advice given to you by your health care provider. Make sure you discuss any questions you  have with your health care provider. Document Released: 07/26/2007 Document Revised: 07/15/2015 Document Reviewed: 06/03/2014 Elsevier Interactive Patient Education  2018 [employer] for Gastroesophageal Reflux Disease, Adult When you have gastroesophageal reflux disease (GERD), the foods you eat and your eating habits are very important. Choosing the right foods can help ease your discomfort. What guidelines do I need to follow?  Choose fruits,  vegetables, whole grains, and low-fat dairy products.  Choose low-fat meat, fish, and poultry.  Limit fats such as oils, salad dressings, butter, nuts, and avocado.  Keep a food diary. This helps you identify foods that cause symptoms.  Avoid foods that cause symptoms. These may be different for everyone.  Eat small meals often instead of 3 large meals a day.  Eat your meals slowly, in a place where you are relaxed.  Limit fried foods.  Cook foods using methods other than frying.  Avoid drinking alcohol.  Avoid drinking large amounts of liquids with your meals.  Avoid bending over or lying down until 2-3 hours after eating. What foods are not recommended? These are some foods and drinks that may make your symptoms worse: Vegetables Tomatoes. Tomato juice. Tomato and spaghetti sauce. Chili peppers. Onion and garlic. Horseradish. Fruits Oranges, grapefruit, and lemon (fruit and juice). Meats High-fat meats, fish, and poultry. This includes hot dogs, ribs, ham, sausage, salami, and bacon. Dairy Whole milk and chocolate milk. Sour cream. Cream. Butter. Ice cream. Cream cheese. Drinks Coffee and tea. Bubbly (carbonated) drinks or energy drinks. Condiments Hot sauce. Barbecue sauce. Sweets/Desserts Chocolate and cocoa. Donuts. Peppermint and spearmint. Fats and Oils High-fat foods. This includes [place] fries and potato chips. Other Vinegar. Strong spices. This includes black pepper, white pepper, red pepper, cayenne, curry powder, cloves, ginger, and chili powder. The items listed above may not be a complete list of foods and drinks to avoid. Contact your dietitian for more information. This information is not intended to replace advice given to you by your health care provider. Make sure you discuss any questions you have with your health care provider. Document Released: 08/08/2011 Document Revised: 07/15/2015 Document Reviewed: 12/11/2012 Elsevier Interactive Patient  Education  2017 [employer]  Hiatal Hernia A hiatal hernia occurs when part of the stomach slides above the muscle that separates the abdomen from the chest (diaphragm). A person can be born with a hiatal hernia (congenital), or it may develop over time. In almost all cases of hiatal hernia, only the top part of the stomach pushes through the diaphragm. Many people have a hiatal hernia with no symptoms. The larger the hernia, the more likely it is that you will have symptoms. In some cases, a hiatal hernia allows stomach acid to flow back into the tube that carries food from your mouth to your stomach (esophagus). This may cause heartburn symptoms. Severe heartburn symptoms may mean that you have developed a condition called gastroesophageal reflux disease (GERD). What are the causes? This condition is caused by a weakness in the opening (hiatus) where the esophagus passes through the diaphragm to attach to the upper part of the stomach. A person may be born with a weakness in the hiatus, or a weakness can develop over time. What increases the risk? This condition is more likely to develop in:  Older people. Age is a major risk factor for a hiatal hernia, especially if you are over the age of 48.  Pregnant women.  People who are overweight.  People who have frequent constipation.  What are the signs or symptoms? Symptoms of this condition usually develop in the form of GERD symptoms. Symptoms include:  Heartburn.  Belching.  Indigestion.  Trouble swallowing.  Coughing or wheezing.  Sore throat.  Hoarseness.  Chest pain.  Nausea and vomiting.  How is this diagnosed? This condition may be diagnosed during testing for GERD. Tests that may be done include:  X-rays of your stomach or chest.  An upper gastrointestinal (GI) series. This is an X-ray exam of your GI tract that is taken after you swallow a chalky liquid that shows up clearly on the X-ray.  Endoscopy. This is a  procedure to look into your stomach using a thin, flexible tube that has a tiny camera and light on the end of it.  How is this treated? This condition may be treated by:  Dietary and lifestyle changes to help reduce GERD symptoms.  Medicines. These may include: ? Over-the-counter antacids. ? Medicines that make your stomach empty more quickly. ? Medicines that block the production of stomach acid (H2 blockers). ? Stronger medicines to reduce stomach acid (proton pump inhibitors).  Surgery to repair the hernia, if other treatments are not helping.  If you have no symptoms, you may not need treatment. Follow these instructions at home: Lifestyle and activity  Do not use any products that contain nicotine or tobacco, such as cigarettes and e-cigarettes. If you need help quitting, ask your health care provider.  Try to achieve and maintain a healthy body weight.  Avoid putting pressure on your abdomen. Anything that puts pressure on your abdomen increases the amount of acid that may be pushed up into your esophagus. ? Avoid bending over, especially after eating. ? Raise the head of your bed by putting blocks under the legs. This keeps your head and esophagus higher than your stomach. ? Do not wear tight clothing around your chest or stomach. ? Try not to strain when having a bowel movement, when urinating, or when lifting heavy objects. Eating and drinking  Avoid foods that can worsen GERD symptoms. These may include: ? Fatty foods, like fried foods. ? Citrus fruits, like oranges or lemon. ? Other foods and drinks that contain acid, like orange juice or tomatoes. ? Spicy food. ? Chocolate.  Eat frequent small meals instead of three large meals a day. This helps prevent your stomach from getting too full. ? Eat slowly. ? Do not lie down right after eating. ? Do not eat 1-2 hours before bed.  Do not drink beverages with caffeine. These include cola, coffee, cocoa, and tea.  Do  not drink alcohol. General instructions  Take over-the-counter and prescription medicines only as told by your health care provider.  Keep all follow-up visits as told by your health care provider. This is important. Contact a health care provider if:  Your symptoms are not controlled with medicines or lifestyle changes.  You are having trouble swallowing.  You have coughing or wheezing that will not go away. Get help right away if:  Your pain is getting worse.  Your pain spreads to your arms, neck, jaw, teeth, or back.  You have shortness of breath.  You sweat for no reason.  You feel sick to your stomach (nauseous) or you vomit.  You vomit blood.  You have bright red blood in your stools.  You have black, tarry stools. This information is not intended to replace advice given to you by your health care provider. Make sure you discuss any questions  you have with your health care provider. Document Released: 04/29/2003 Document Revised: 01/31/2016 Document Reviewed: 01/31/2016 Elsevier Interactive Patient Education  [employer].

## 2017-08-09 NOTE — Progress Notes (Signed)
Chief Complaint  Patient presents with  . Abdominal Pain  . Nausea  . Emesis   F/u with mom here  1. Acute ab bloating and gen pain GB removed 2 years ago. Increased GERD x 2 months worse but over 4 months had and having nausea, belching and bloating and nausea, reduced appetite, and cough. 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 He had tried peptobismol, zantac 150 taking 6-8 x per day. In the past protonix, pepcid, nexium, zantac have not helped. He is not constipated and had black stool this am. Reviewed EGD 07/2016 mild esophageal stenosis dilated and had EGD 07/2015 4 cm hiatal hernia Dr. Ardis Hughs pt does not want to see him any longer  Due above sx's c/o reduced sleep   Review of Systems  Constitutional: Negative for weight loss.  HENT: Negative for hearing loss.   Eyes: Negative for blurred vision.  Respiratory: Negative for shortness of breath.   Cardiovascular: Negative for chest pain.  Gastrointestinal: Positive for abdominal pain, heartburn, nausea and vomiting. Negative for constipation.  Skin: Negative for rash.  Neurological: Negative for headaches.  Psychiatric/Behavioral: Negative for depression.   Past Medical History:  Diagnosis Date  . Allergy   . Anal fissure 04/06/2017  . Anxiety   . Asthma   . Chest pain off and on  . Depression   . Family history of adverse reaction to anesthesia    adopted history unknown  . GERD (gastroesophageal reflux disease)   . Pancreatitis last 2015  . Substance abuse [facility])    Past Surgical History:  Procedure Laterality Date  . CHOLECYSTECTOMY  08/16/2015   Procedure: LAPAROSCOPIC CHOLECYSTECTOMY;  Surgeon: Alphonsa Overall, MD;  Location: [facility];  Service: General;;  . ESOPHAGOGASTRODUODENOSCOPY (EGD) WITH PROPOFOL N/A 07/27/2016   Procedure: ESOPHAGOGASTRODUODENOSCOPY (EGD) WITH PROPOFOL;  Surgeon: Milus Banister, MD;  Location: [facility];  Service: Endoscopy;  Laterality: N/A;  . TONSILLECTOMY AND  ADENOIDECTOMY  as child   and adenoids   Family History  Adopted: Yes  Problem Relation Age of Onset  . Diabetes Paternal Grandfather   . Lung cancer Paternal Grandfather   . Ulcers Father   . Colon cancer Neg Hx    Social History   Socioeconomic History  . Marital status: Single    Spouse name: Not on file  . Number of children: 2  . Years of education: Not on file  . Highest education level: Not on file  Occupational History  . Not on file  Social Needs  . Financial resource strain: Not on file  . Food insecurity:    Worry: Not on file    Inability: Not on file  . Transportation needs:    Medical: Not on file    Non-medical: Not on file  Tobacco Use  . Smoking status: Current Every Day Smoker    Packs/day: 0.50    Types: Cigarettes  . Smokeless tobacco: Former Systems developer    Quit date: 07/22/2015  Substance and Sexual Activity  . Alcohol use: No    Alcohol/week: 0.0 oz    Comment: hx alcolol use quit march 2017  . Drug use: Yes    Types: Marijuana    Comment: heroin, cocaine, acid, pain killers; last use yrs ago marijuana daily use  . Sexual activity: Yes    Partners: Female  Lifestyle  . Physical activity:    Days per week: Not on file    Minutes per session: Not on file  .  Stress: Not on file  Relationships  . Social connections:    Talks on phone: Not on file    Gets together: Not on file    Attends religious service: Not on file    Active member of club or organization: Not on file    Attends meetings of clubs or organizations: Not on file    Relationship status: Not on file  . Intimate partner violence:    Fear of current or ex partner: Not on file    Emotionally abused: Not on file    Physically abused: Not on file    Forced sexual activity: Not on file  Other Topics Concern  . Not on file  Social History Narrative   Lives at home with parents.      Work - none at present, has worked Lawyer - Completed high school       Hobbies - computers, outdoors, fishing      Diet - regular      Exercise - none   Current Meds  Medication Sig  . albuterol (PROVENTIL HFA;VENTOLIN HFA) 108 (90 Base) MCG/ACT inhaler Inhale 1-2 puffs into the lungs every 6 (six) hours as needed for wheezing or shortness of breath.  . ranitidine (ZANTAC) 150 MG tablet Take 1 tablet (150 mg total) by mouth 2 (two) times daily. (Patient taking differently: Take 150 mg by mouth 2 (two) times daily as needed for heartburn. )  . Skin Protectants, Misc. (CERAVE) OINT Apply 1 application topically daily as needed (itching).  . [DISCONTINUED] albuterol (PROVENTIL HFA;VENTOLIN HFA) 108 (90 Base) MCG/ACT inhaler Inhale 2 puffs into the lungs every 6 (six) hours as needed for wheezing or shortness of breath.   Allergies  Allergen Reactions  . Bee Venom Anaphylaxis  . Quetiapine Rash  . Serotonin Reuptake Inhibitors (Ssris) Rash  . Amoxicillin Diarrhea    Has patient had a PCN reaction causing immediate rash, facial/tongue/throat swelling, SOB or lightheadedness with hypotension: No Has patient had a PCN reaction causing severe rash involving mucus membranes or skin necrosis: No Has patient had a PCN reaction that required hospitalization: No Has patient had a PCN reaction occurring within the last 10 years: Yes If all of the above answers are "NO", then may proceed with Cephalosporin use.   . Gabapentin Nausea And Vomiting  . Prednisone Other (See Comments)    Difficulty breathing  . Anette Guarneri [Lurasidone Hcl] Rash and Nausea And Vomiting   No results found for this or any previous visit (from the past 2160 hour(s)). Objective  Body mass index is 31.07 kg/m. Wt Readings from Last 3 Encounters:  08/09/17 198 lb 6.4 oz (90 kg)  04/05/17 200 lb (90.7 kg)  03/26/17 196 lb (88.9 kg)   Temp Readings from Last 3 Encounters:  08/09/17 98.3 F (36.8 C) (Oral)  04/05/17 97.7 F (36.5 C) (Oral)  03/26/17 98.2 F (36.8 C) (Oral)   BP Readings  from Last 3 Encounters:  08/09/17 130/76  04/05/17 120/65  03/26/17 122/70   Pulse Readings from Last 3 Encounters:  08/09/17 65  04/05/17 86  03/26/17 87    Physical Exam  Constitutional: He is oriented to person, place, and time. Vital signs are normal. He appears well-developed and well-nourished. He is cooperative.  HENT:  Head: Normocephalic and atraumatic.  Mouth/Throat: Oropharynx is clear and moist and mucous membranes are normal.  Eyes: Pupils are equal, round, and reactive to light. Conjunctivae are normal.  Cardiovascular:  Normal rate, regular rhythm and normal heart sounds.  Pulmonary/Chest: Effort normal and breath sounds normal.  Abdominal: Soft. Normal appearance and bowel sounds are normal. He exhibits distension. There is tenderness.  Neurological: He is alert and oriented to person, place, and time. Gait normal.  Skin: Skin is warm, dry and intact.  Psychiatric: He has a normal mood and affect. His speech is normal and behavior is normal. Judgment and thought content normal. Cognition and memory are normal.  Nursing note and vitals reviewed.   Assessment   1. Ab pain and bloating, h/o 4 cm hiatal hernia, n/v/GERD uncontrolled  2. Asthma smoking with cough  3. Multiple nevi Plan   1. CMET, CBC, H pylori  Trial of dexilant 60 mg failed nexium, pepcid, protonix, zantac w/o help Trial of carafate  Will order [place] ab, barium swallow  Refer to  GI no longer wants to see Dr. Ardis Hughs  Also pt wants to consider repair of hiatal hernia if warranted consider surgery referral [facility] Dr. Hassell Done or Dr. Bary Castilla I.e Florence Canner rec trigger reduction  2. Prn Albuterol  3. Referred Dr. Clemon Chambers tbse   Provider: Dr. Olivia Mackie McLean-Scocuzza-Internal Medicine

## 2017-08-10 ENCOUNTER — Encounter (INDEPENDENT_AMBULATORY_CARE_PROVIDER_SITE_OTHER): Payer: Self-pay

## 2017-08-14 ENCOUNTER — Ambulatory Visit
Admission: RE | Admit: 2017-08-14 | Discharge: 2017-08-14 | Disposition: A | Discharge status: Home / Self Care | Payer: Medicare Other | Source: Ambulatory Visit | Attending: Internal Medicine | Admitting: Internal Medicine

## 2017-08-14 ENCOUNTER — Other Ambulatory Visit: Payer: Self-pay | Admitting: Internal Medicine

## 2017-08-14 DIAGNOSIS — K449 Diaphragmatic hernia without obstruction or gangrene: Secondary | ICD-10-CM

## 2017-08-14 DIAGNOSIS — K219 Gastro-esophageal reflux disease without esophagitis: Secondary | ICD-10-CM | POA: Insufficient documentation

## 2017-08-14 DIAGNOSIS — R140 Abdominal distension (gaseous): Secondary | ICD-10-CM | POA: Diagnosis not present

## 2017-08-14 DIAGNOSIS — R1084 Generalized abdominal pain: Secondary | ICD-10-CM | POA: Insufficient documentation

## 2017-08-14 DIAGNOSIS — R1310 Dysphagia, unspecified: Secondary | ICD-10-CM | POA: Diagnosis not present

## 2017-08-14 DIAGNOSIS — Z9049 Acquired absence of other specified parts of digestive tract: Secondary | ICD-10-CM | POA: Insufficient documentation

## 2017-08-14 LAB — H PYLORI, IGM, IGG, IGA AB: H. pylori, IgG AbS: <0.8 Index Value (ref 0.00–0.79)

## 2017-08-14 NOTE — Progress Notes (Signed)
Severe GERD referring to surgery per pt request Dr. Irving Shows for consider Nissen   [place]

## 2017-08-16 ENCOUNTER — Encounter: Payer: Self-pay | Admitting: *Deleted

## 2017-08-21 ENCOUNTER — Encounter: Payer: Self-pay | Admitting: *Deleted

## 2017-09-04 ENCOUNTER — Telehealth: Payer: Self-pay | Admitting: Family Medicine

## 2017-09-04 NOTE — Telephone Encounter (Signed)
Informed patients mother per referral from [place], [place] is no longer practicing but the referral has been sent to [place] [facility]

## 2017-09-04 NOTE — Telephone Encounter (Signed)
Copied from [place] [id number]. Topic: Referral - Question >> Sep 04, 2017 10:36 AM Cecelia Byars, NT wrote: Reason for CRM: Patient mother called and would like the information for Dr Duke Salvia  the referral in his my chart ,as well as the contact information ,thanks

## 2017-09-11 ENCOUNTER — Encounter: Payer: Self-pay | Admitting: Gastroenterology

## 2017-09-11 ENCOUNTER — Ambulatory Visit (INDEPENDENT_AMBULATORY_CARE_PROVIDER_SITE_OTHER): Payer: Medicare Other | Admitting: Gastroenterology

## 2017-09-11 ENCOUNTER — Ambulatory Visit: Payer: Medicare Other | Admitting: Gastroenterology

## 2017-09-11 VITALS — BP 137/73 | HR 73 | Ht 67.0 in | Wt 190.0 lb

## 2017-09-11 DIAGNOSIS — K219 Gastro-esophageal reflux disease without esophagitis: Secondary | ICD-10-CM

## 2017-09-11 NOTE — Patient Instructions (Signed)
Bed wedge F/U 3 months  Gastroesophageal Reflux Disease, Adult Normally, food travels down the esophagus and stays in the stomach to be digested. If a person has gastroesophageal reflux disease (GERD), food and stomach acid move back up into the esophagus. When this happens, the esophagus becomes sore and swollen (inflamed). Over time, GERD can make small holes (ulcers) in the lining of the esophagus. Follow these instructions at home: Diet  Follow a diet as told by your doctor. You may need to avoid foods and drinks such as: ? Coffee and tea (with or without caffeine). ? Drinks that contain alcohol. ? Energy drinks and sports drinks. ? Carbonated drinks or sodas. ? Chocolate and cocoa. ? Peppermint and mint flavorings. ? Garlic and onions. ? Horseradish. ? Spicy and acidic foods, such as peppers, chili powder, curry powder, vinegar, hot sauces, and BBQ sauce. ? Citrus fruit juices and citrus fruits, such as oranges, lemons, and limes. ? Tomato-based foods, such as red sauce, chili, salsa, and pizza with red sauce. ? Fried and fatty foods, such as donuts, french fries, potato chips, and high-fat dressings. ? High-fat meats, such as hot dogs, rib eye steak, sausage, ham, and bacon. ? High-fat dairy items, such as whole milk, butter, and cream cheese.  Eat small meals often. Avoid eating large meals.  Avoid drinking large amounts of liquid with your meals.  Avoid eating meals during the 2-3 hours before bedtime.  Avoid lying down right after you eat.  Do not exercise right after you eat. General instructions  Pay attention to any changes in your symptoms.  Take over-the-counter and prescription medicines only as told by your doctor. Do not take aspirin, ibuprofen, or other NSAIDs unless your doctor says it is okay.  Do not use any tobacco products, including cigarettes, chewing tobacco, and e-cigarettes. If you need help quitting, ask your doctor.  Wear loose clothes. Do not  wear anything tight around your waist.  Raise (elevate) the head of your bed about 6 inches (15 cm).  Try to lower your stress. If you need help doing this, ask your doctor.  If you are overweight, lose an amount of weight that is healthy for you. Ask your doctor about a safe weight loss goal.  Keep all follow-up visits as told by your doctor. This is important. Contact a doctor if:  You have new symptoms.  You lose weight and you do not know why it is happening.  You have trouble swallowing, or it hurts to swallow.  You have wheezing or a cough that keeps happening.  Your symptoms do not get better with treatment.  You have a hoarse voice. Get help right away if:  You have pain in your arms, neck, jaw, teeth, or back.  You feel sweaty, dizzy, or light-headed.  You have chest pain or shortness of breath.  You throw up (vomit) and your throw up looks like blood or coffee grounds.  You pass out (faint).  Your poop (stool) is bloody or black.  You cannot swallow, drink, or eat. This information is not intended to replace advice given to you by your health care provider. Make sure you discuss any questions you have with your health care provider. Document Released: 07/26/2007 Document Revised: 07/15/2015 Document Reviewed: 06/03/2014 Elsevier Interactive Patient Education  [employer].

## 2017-09-14 ENCOUNTER — Telehealth: Payer: Self-pay

## 2017-09-14 ENCOUNTER — Other Ambulatory Visit: Payer: Self-pay

## 2017-09-14 DIAGNOSIS — K219 Gastro-esophageal reflux disease without esophagitis: Secondary | ICD-10-CM

## 2017-09-14 NOTE — Telephone Encounter (Signed)
Spoke with pt and he was agreeable to reschedule from 09/27/17 to 10/10/17. Endo notified.

## 2017-09-17 NOTE — Progress Notes (Signed)
Michael [name] [address]  [place]  [place], [place] [address]  Main: [phone]  Fax: [phone]   Gastroenterology Consultation  Referring Provider:     McLean-Scocuzza, Olivia Mackie * Primary Care Physician:  [name], MD Primary Gastroenterologist:  Dr. Vonda [name] Reason for Consultation:     GERD        HPI:    Chief Complaint  Patient presents with  . Gastroesophageal Reflux    Referred from Dr. Karlyn Agee  . Abdominal Pain    Michael [name] is a 27 y.o. y/o male referred for consultation & management  by Dr. Caryl Bis, Angela Adam, MD.  Patient reports daily severe heartburn, belching, multiple times a day, despite PPI, and H2 RA treatment.  No hematochezia, but reports black stools intermittently, but takes Pepto-Bismol.  In June 2018 patient had an EGD by Dr. Ardis Hughs for dysphagia, mild intrinsic stenosis was reported and dilated to 20 mm.  June 2017 EGD reported a 4 cm hiatal hernia.  Patient has been referred to surgery for evaluation of hiatal hernia repair due to ongoing symptoms despite PPI therapy.  Past Medical History:  Diagnosis Date  . Allergy   . Anal fissure 04/06/2017  . Anxiety   . Asthma   . Chest pain off and on  . Depression   . Family history of adverse reaction to anesthesia    adopted history unknown  . GERD (gastroesophageal reflux disease)   . Pancreatitis last 2015  . Substance abuse [facility])     Past Surgical History:  Procedure Laterality Date  . CHOLECYSTECTOMY  08/16/2015   Procedure: LAPAROSCOPIC CHOLECYSTECTOMY;  Surgeon: Alphonsa Overall, MD;  [facility];  Service: General;;  . ESOPHAGOGASTRODUODENOSCOPY (EGD) WITH PROPOFOL N/A 07/27/2016   Procedure: ESOPHAGOGASTRODUODENOSCOPY (EGD) WITH PROPOFOL;  Surgeon: Milus Banister, MD;  [facility];  Service: Endoscopy;  Laterality: N/A;  . TONSILLECTOMY AND ADENOIDECTOMY  as child   and adenoids    Prior to Admission medications   Medication Sig  Start Date End Date Taking? Authorizing Provider  albuterol (PROVENTIL HFA;VENTOLIN HFA) 108 (90 Base) MCG/ACT inhaler Inhale 1-2 puffs into the lungs every 6 (six) hours as needed for wheezing or shortness of breath. 08/09/17  Yes McLean-Scocuzza, Nino Glow, MD  dexlansoprazole (DEXILANT) 60 MG capsule Take 1 capsule (60 mg total) by mouth daily. In am 30 minutes before food 08/09/17  Yes McLean-Scocuzza, Nino Glow, MD  ondansetron (ZOFRAN) 4 MG tablet Take 1 tablet (4 mg total) by mouth every 8 (eight) hours as needed for nausea or vomiting. 08/09/17  Yes McLean-Scocuzza, Nino Glow, MD  ranitidine (ZANTAC) 150 MG tablet Take 1 tablet (150 mg total) by mouth 2 (two) times daily. Patient taking differently: Take 150 mg by mouth 2 (two) times daily as needed for heartburn.  11/30/15  Yes Willia Craze, NP  Skin Protectants, Misc. (CERAVE) OINT Apply 1 application topically daily as needed (itching).   Yes [provider]  sucralfate (CARAFATE) 1 GM/10ML suspension Take 10 mLs (1 g total) by mouth 4 (four) times daily -  with meals and at bedtime. 08/09/17  Yes McLean-Scocuzza, Nino Glow, MD    Family History  Adopted: Yes  Problem Relation Age of Onset  . Diabetes Paternal Grandfather   . Lung cancer Paternal Grandfather   . Ulcers Father   . Colon cancer Neg Hx      Social History   Tobacco Use  . Smoking status: Current Every  Day Smoker    Packs/day: 0.50    Types: Cigarettes  . Smokeless tobacco: Current User    Types: Chew  Substance Use Topics  . Alcohol use: No    Alcohol/week: 0.0 oz    Comment: hx alcolol use quit march 2017  . Drug use: Yes    Types: Marijuana    Comment: heroin, cocaine, acid, pain killers; last use yrs ago marijuana daily use    Allergies as of 09/11/2017 - Review Complete 08/09/2017  Allergen Reaction Noted  . Bee venom Anaphylaxis 06/14/2015  . Quetiapine Rash 06/21/2015  . Serotonin reuptake inhibitors (ssris) Rash 10/09/2014  . Amoxicillin  Diarrhea 05/04/2014  . Gabapentin Nausea And Vomiting 10/09/2014  . Prednisone Other (See Comments) 10/09/2014  . Latuda [lurasidone hcl] Rash and Nausea And Vomiting 10/09/2014    Review of Systems:    All systems reviewed and negative except where noted in HPI.   Physical Exam:  BP 137/73   Pulse 73   Ht 5\' 7"  (1.702 m)   Wt 190 lb (86.2 kg)   BMI 29.76 kg/m  No LMP for male patient. Psych:  Alert and cooperative. Normal mood and affect. General:   Alert,  Well-developed, well-nourished, pleasant and cooperative in NAD Head:  Normocephalic and atraumatic. Eyes:  Sclera clear, no icterus.   Conjunctiva pink. Ears:  Normal auditory acuity. Nose:  No deformity, discharge, or lesions. Mouth:  No deformity or lesions,oropharynx pink & moist. Neck:  Supple; no masses or thyromegaly. Lungs:  Respirations even and unlabored.  Clear throughout to auscultation.   No wheezes, crackles, or rhonchi. No acute distress. Heart:  Regular rate and rhythm; no murmurs, clicks, rubs, or gallops. Abdomen:  Normal bowel sounds.  No bruits.  Soft, non-tender and non-distended without masses, hepatosplenomegaly or hernias noted.  No guarding or rebound tenderness.    Msk:  Symmetrical without gross deformities. Good, equal movement & strength bilaterally. Pulses:  Normal pulses noted. Extremities:  No clubbing or edema.  No cyanosis. Neurologic:  Alert and oriented x3;  grossly normal neurologically. Skin:  Intact without significant lesions or rashes. No jaundice. Lymph Nodes:  No significant cervical adenopathy. Psych:  Alert and cooperative. Normal mood and affect.   Labs: CBC    Component Value Date/Time   WBC 8.6 08/09/2017 1004   RBC 4.84 08/09/2017 1004   HGB 15.7 08/09/2017 1004   HGB 16.0 08/16/2013 1432   HCT 45.5 08/09/2017 1004   HCT 46.3 08/16/2013 1432   PLT 215.0 08/09/2017 1004   PLT 205 08/16/2013 1432   MCV 94.1 08/09/2017 1004   MCV 92 08/16/2013 1432   MCH 31.3  03/26/2017 1443   MCHC 34.5 08/09/2017 1004   RDW 13.5 08/09/2017 1004   RDW 13.1 08/16/2013 1432   LYMPHSABS 3.0 08/09/2017 1004   LYMPHSABS 2.2 08/16/2013 1432   MONOABS 0.8 08/09/2017 1004   MONOABS 0.8 08/16/2013 1432   EOSABS 0.8 (H) 08/09/2017 1004   EOSABS 0.5 08/16/2013 1432   BASOSABS 0.1 08/09/2017 1004   BASOSABS 0.1 08/16/2013 1432   BASOSABS 1 08/16/2013 1432   CMP     Component Value Date/Time   NA 139 08/09/2017 1004   NA 141 08/13/2013 2252   K 4.0 08/09/2017 1004   K 4.0 08/13/2013 2252   CL 106 08/09/2017 1004   CL 105 08/13/2013 2252   CO2 25 08/09/2017 1004   CO2 28 08/13/2013 2252   GLUCOSE 102 (H) 08/09/2017 1004   GLUCOSE 92  08/13/2013 2252   BUN 13 08/09/2017 1004   BUN 9 08/13/2013 2252   CREATININE 0.95 08/09/2017 1004   CREATININE 1.30 08/13/2013 2252   CALCIUM 9.7 08/09/2017 1004   CALCIUM 9.2 08/13/2013 2252   PROT 7.3 08/09/2017 1004   PROT 7.9 08/13/2013 2252   ALBUMIN 4.7 08/09/2017 1004   ALBUMIN 4.2 08/13/2013 2252   AST 15 08/09/2017 1004   AST 28 08/13/2013 2252   ALT 20 08/09/2017 1004   ALT 33 08/13/2013 2252   ALKPHOS 69 08/09/2017 1004   ALKPHOS 97 08/13/2013 2252   BILITOT 0.3 08/09/2017 1004   BILITOT 0.4 08/13/2013 2252   GFRNONAA >60 03/26/2017 1443   GFRNONAA >60 08/13/2013 2252   GFRAA >60 03/26/2017 1443   GFRAA >60 08/13/2013 2252    Imaging Studies: No results found.  Assessment and Plan:   [name] Mchaney is a 27 y.o. y/o male has been referred for daily heartburn, GERD, history of hiatal hernia, ongoing symptoms despite PPI therapy  Patient is already been referred to surgery by his primary care provider for evaluation of hiatal hernia repair His last EGD in June 2018 did not report a hiatal hernia, but in June 2017 hiatal hernia was reported Repeat EGD due to daily GERD symptoms is indicated for evaluation  EGD in June 2018 also showed benign intrinsic cirrhosis that was dilated to 20 mm.  This is likely  a Schatzki's ring.  Will evaluate you at the time of the EGD.  Is on Dexilant and Zantac by primary care provider.  Continue Can change medications depending on EGD findings  I have discussed alternative options, risks & benefits,  which include, but are not limited to, bleeding, infection, perforation,respiratory complication & drug reaction.  The patient agrees with this plan & written consent will be obtained.     Dr Michael [name]

## 2017-09-25 ENCOUNTER — Encounter: Payer: Self-pay | Admitting: Family Medicine

## 2017-09-25 DIAGNOSIS — F329 Major depressive disorder, single episode, unspecified: Secondary | ICD-10-CM

## 2017-09-25 DIAGNOSIS — F32A Depression, unspecified: Secondary | ICD-10-CM

## 2017-09-25 DIAGNOSIS — F419 Anxiety disorder, unspecified: Principal | ICD-10-CM

## 2017-09-25 NOTE — Telephone Encounter (Signed)
I will forward to LPN and CMA to contact patient to triage his depression and anxiety. Please make sure he is not having any thoughts of harming himself or others. If he is please discuss with an available provider to help determine the most appropriate course of action. Thanks.

## 2017-09-25 NOTE — Telephone Encounter (Signed)
Patient states he has been experiencing worsening anxiety and depression for the last week. He denies any thoughts of suicide, harming himself or anyone else. Informed patient if he develops these thoughts he needs to be seen immediately. Patient verbalized understanding.

## 2017-09-25 NOTE — Telephone Encounter (Signed)
Noted. I will forward to Melissa to try to get him set up with psychiatry. Thanks.

## 2017-10-10 ENCOUNTER — Other Ambulatory Visit: Payer: Self-pay

## 2017-10-10 ENCOUNTER — Ambulatory Visit: Payer: Medicare Other | Admitting: Anesthesiology

## 2017-10-10 ENCOUNTER — Ambulatory Visit
Admission: RE | Admit: 2017-10-10 | Discharge: 2017-10-10 | Disposition: A | Discharge status: Home / Self Care | Payer: Medicare Other | Source: Ambulatory Visit | Attending: Gastroenterology | Admitting: Gastroenterology

## 2017-10-10 ENCOUNTER — Encounter
Admission: RE | Disposition: A | Discharge status: Home / Self Care | Payer: Self-pay | Source: Ambulatory Visit | Attending: Gastroenterology

## 2017-10-10 DIAGNOSIS — F419 Anxiety disorder, unspecified: Secondary | ICD-10-CM | POA: Diagnosis not present

## 2017-10-10 DIAGNOSIS — R1310 Dysphagia, unspecified: Secondary | ICD-10-CM

## 2017-10-10 DIAGNOSIS — F329 Major depressive disorder, single episode, unspecified: Secondary | ICD-10-CM | POA: Diagnosis not present

## 2017-10-10 DIAGNOSIS — K210 Gastro-esophageal reflux disease with esophagitis: Secondary | ICD-10-CM | POA: Diagnosis not present

## 2017-10-10 DIAGNOSIS — K219 Gastro-esophageal reflux disease without esophagitis: Secondary | ICD-10-CM | POA: Diagnosis not present

## 2017-10-10 DIAGNOSIS — Z79899 Other long term (current) drug therapy: Secondary | ICD-10-CM | POA: Insufficient documentation

## 2017-10-10 DIAGNOSIS — R12 Heartburn: Secondary | ICD-10-CM

## 2017-10-10 DIAGNOSIS — J45909 Unspecified asthma, uncomplicated: Secondary | ICD-10-CM | POA: Insufficient documentation

## 2017-10-10 DIAGNOSIS — F209 Schizophrenia, unspecified: Secondary | ICD-10-CM | POA: Insufficient documentation

## 2017-10-10 DIAGNOSIS — Q402 Other specified congenital malformations of stomach: Secondary | ICD-10-CM

## 2017-10-10 DIAGNOSIS — K228 Other specified diseases of esophagus: Secondary | ICD-10-CM | POA: Diagnosis not present

## 2017-10-10 DIAGNOSIS — I10 Essential (primary) hypertension: Secondary | ICD-10-CM | POA: Diagnosis not present

## 2017-10-10 DIAGNOSIS — K2270 Barrett's esophagus without dysplasia: Secondary | ICD-10-CM | POA: Diagnosis not present

## 2017-10-10 DIAGNOSIS — F17210 Nicotine dependence, cigarettes, uncomplicated: Secondary | ICD-10-CM | POA: Diagnosis not present

## 2017-10-10 DIAGNOSIS — Q458 Other specified congenital malformations of digestive system: Secondary | ICD-10-CM | POA: Diagnosis not present

## 2017-10-10 DIAGNOSIS — F319 Bipolar disorder, unspecified: Secondary | ICD-10-CM | POA: Diagnosis not present

## 2017-10-10 DIAGNOSIS — K2289 Other specified disease of esophagus: Secondary | ICD-10-CM

## 2017-10-10 DIAGNOSIS — K3189 Other diseases of stomach and duodenum: Secondary | ICD-10-CM | POA: Diagnosis not present

## 2017-10-10 HISTORY — PX: ESOPHAGOGASTRODUODENOSCOPY (EGD) WITH PROPOFOL: SHX5813

## 2017-10-10 LAB — URINE DRUG SCREEN, QUALITATIVE (ARMC ONLY)
AMPHETAMINES, UR SCREEN: NOT DETECTED
BARBITURATES, UR SCREEN: NOT DETECTED
COCAINE METABOLITE, UR ~~LOC~~: NOT DETECTED
Cannabinoid 50 Ng, Ur ~~LOC~~: POSITIVE — AB
MDMA (ECSTASY) UR SCREEN: NOT DETECTED
METHADONE SCREEN, URINE: NOT DETECTED
OPIATE, UR SCREEN: NOT DETECTED
Phencyclidine (PCP) Ur S: NOT DETECTED
Tricyclic, Ur Screen: NOT DETECTED

## 2017-10-10 SURGERY — ESOPHAGOGASTRODUODENOSCOPY (EGD) WITH PROPOFOL
Anesthesia: General

## 2017-10-10 MED ORDER — PROPOFOL 500 MG/50ML IV EMUL
INTRAVENOUS | Status: AC
  Filled 2017-10-10: qty 50

## 2017-10-10 MED ORDER — PHENYLEPHRINE HCL 10 MG/ML IJ SOLN
INTRAMUSCULAR | Status: DC | PRN
  Administered 2017-10-10: 50 ug via INTRAVENOUS

## 2017-10-10 MED ORDER — FENTANYL CITRATE (PF) 100 MCG/2ML IJ SOLN
INTRAMUSCULAR | Status: AC
  Filled 2017-10-10: qty 2

## 2017-10-10 MED ORDER — MIDAZOLAM HCL 2 MG/2ML IJ SOLN
INTRAMUSCULAR | Status: DC | PRN
  Administered 2017-10-10: 2 mg via INTRAVENOUS

## 2017-10-10 MED ORDER — LIDOCAINE HCL (CARDIAC) PF 100 MG/5ML IV SOSY
PREFILLED_SYRINGE | INTRAVENOUS | Status: DC | PRN
  Administered 2017-10-10: 50 mg via INTRAVENOUS

## 2017-10-10 MED ORDER — PROPOFOL 10 MG/ML IV BOLUS
INTRAVENOUS | Status: DC | PRN
  Administered 2017-10-10 (×4): 30 mg via INTRAVENOUS
  Administered 2017-10-10: 70 mg via INTRAVENOUS
  Administered 2017-10-10 (×7): 30 mg via INTRAVENOUS

## 2017-10-10 MED ORDER — SODIUM CHLORIDE 0.9 % IV SOLN
INTRAVENOUS | Status: DC
  Administered 2017-10-10: 09:00:00 via INTRAVENOUS

## 2017-10-10 MED ORDER — OMEPRAZOLE 20 MG PO CPDR: 40.0000 mg | DELAYED_RELEASE_CAPSULE | Freq: Two times a day (BID) | ORAL | 0 refills | Status: DC

## 2017-10-10 MED ORDER — LIDOCAINE HCL (PF) 2 % IJ SOLN
INTRAMUSCULAR | Status: AC
  Filled 2017-10-10: qty 10

## 2017-10-10 MED ORDER — MIDAZOLAM HCL 2 MG/2ML IJ SOLN
INTRAMUSCULAR | Status: AC
  Filled 2017-10-10: qty 2

## 2017-10-10 MED ORDER — FENTANYL CITRATE (PF) 100 MCG/2ML IJ SOLN
INTRAMUSCULAR | Status: DC | PRN
  Administered 2017-10-10 (×2): 25 ug via INTRAVENOUS
  Administered 2017-10-10: 50 ug via INTRAVENOUS

## 2017-10-10 NOTE — Transfer of Care (Signed)
Immediate Anesthesia Transfer of Care Note  Patient: Michael [name]  Procedure(s) Performed: ESOPHAGOGASTRODUODENOSCOPY (EGD) WITH PROPOFOL (N/A )  Patient Location: PACU  Anesthesia Type:General  Level of Consciousness: drowsy  Airway & Oxygen Therapy: Patient Spontanous Breathing and Patient connected to nasal cannula oxygen  Post-op Assessment: Report given to RN and Post -op Vital signs reviewed and stable  Post vital signs: Reviewed and stable  Last Vitals:  Vitals Value Taken Time  BP 110/63 10/10/2017  9:46 AM  Temp 36.1 C 10/10/2017  9:45 AM  Pulse 78 10/10/2017  9:47 AM  Resp 15 10/10/2017  9:47 AM  SpO2 96 % 10/10/2017  9:47 AM  Vitals shown include unvalidated device data.  Last Pain:  Vitals:   10/10/17 0945  TempSrc: Tympanic  PainSc: 0-No pain         Complications: No apparent anesthesia complications

## 2017-10-10 NOTE — Anesthesia Procedure Notes (Signed)
Procedure Name: MAC Date/Time: 10/10/2017 9:23 AM Performed by: Rudean Hitt, CRNA Pre-anesthesia Checklist: Patient identified, Emergency Drugs available, Suction available, Patient being monitored and Timeout performed Oxygen Delivery Method: Nasal cannula Induction Type: IV induction

## 2017-10-10 NOTE — Op Note (Signed)
Dexilant changed to Omeprazole 40mg  BID for 30 days due to significant heartburn as per patient and dexilant and zantac combo not relieving symptoms. (Risks of PPI use were discussed with patient including bone loss, C. Diff diarrhea, pneumonia, infections, CKD, electrolyte abnormalities.   Pt. Verbalizes understanding and chooses to continue the medication.)

## 2017-10-10 NOTE — H&P (Signed)
Michael Antigua, MD [address], [place], [place], [place], [address] [address], [place], [place], [place], [address] Phone: [phone]  Fax: [phone]  Primary Care Physician:  [name], MD   Pre-Procedure History & Physical: HPI:  Michael [name] is a 27 y.o. male is here for an EGD.   Past Medical History:  Diagnosis Date  . Allergy   . Anal fissure 04/06/2017  . Anxiety   . Asthma   . Chest pain off and on  . Depression   . Family history of adverse reaction to anesthesia    adopted history unknown  . GERD (gastroesophageal reflux disease)   . Pancreatitis last 2015  . Substance abuse [facility])     Past Surgical History:  Procedure Laterality Date  . CHOLECYSTECTOMY  08/16/2015   Procedure: LAPAROSCOPIC CHOLECYSTECTOMY;  Surgeon: Alphonsa Overall, MD;  Location: [facility] ORS;  Service: General;;  . ESOPHAGOGASTRODUODENOSCOPY (EGD) WITH PROPOFOL N/A 07/27/2016   Procedure: ESOPHAGOGASTRODUODENOSCOPY (EGD) WITH PROPOFOL;  Surgeon: Milus Banister, MD;  Location: [facility] ENDOSCOPY;  Service: Endoscopy;  Laterality: N/A;  . TONSILLECTOMY AND ADENOIDECTOMY  as child   and adenoids    Prior to Admission medications   Medication Sig Start Date End Date Taking? Authorizing Provider  albuterol (PROVENTIL HFA;VENTOLIN HFA) 108 (90 Base) MCG/ACT inhaler Inhale 1-2 puffs into the lungs every 6 (six) hours as needed for wheezing or shortness of breath. 08/09/17  Yes McLean-Scocuzza, Nino Glow, MD  dexlansoprazole (DEXILANT) 60 MG capsule Take 1 capsule (60 mg total) by mouth daily. In am 30 minutes before food 08/09/17  Yes McLean-Scocuzza, Nino Glow, MD  ondansetron (ZOFRAN) 4 MG tablet Take 1 tablet (4 mg total) by mouth every 8 (eight) hours as needed for nausea or vomiting. 08/09/17  Yes McLean-Scocuzza, Nino Glow, MD  ranitidine (ZANTAC) 150 MG tablet Take 1 tablet (150 mg total) by mouth 2 (two) times daily. Patient taking differently: Take 150 mg by mouth 2 (two) times daily as  needed for heartburn.  11/30/15  Yes Willia Craze, NP  sucralfate (CARAFATE) 1 GM/10ML suspension Take 10 mLs (1 g total) by mouth 4 (four) times daily -  with meals and at bedtime. 08/09/17  Yes McLean-Scocuzza, Nino Glow, MD  Skin Protectants, Misc. (CERAVE) OINT Apply 1 application topically daily as needed (itching).    [provider]    Allergies as of 09/14/2017 - Review Complete 08/09/2017  Allergen Reaction Noted  . Bee venom Anaphylaxis 06/14/2015  . Quetiapine Rash 06/21/2015  . Serotonin reuptake inhibitors (ssris) Rash 10/09/2014  . Amoxicillin Diarrhea 05/04/2014  . Gabapentin Nausea And Vomiting 10/09/2014  . Prednisone Other (See Comments) 10/09/2014  . Latuda [lurasidone hcl] Rash and Nausea And Vomiting 10/09/2014    Family History  Adopted: Yes  Problem Relation Age of Onset  . Diabetes Paternal Grandfather   . Lung cancer Paternal Grandfather   . Ulcers Father   . Colon cancer Neg Hx     Social History   Socioeconomic History  . Marital status: Single    Spouse name: Not on file  . Number of children: 2  . Years of education: Not on file  . Highest education level: Not on file  Occupational History  . Not on file  Social Needs  . Financial resource strain: Not on file  . Food insecurity:    Worry: Not on file    Inability: Not on file  . Transportation needs:    Medical: Not on  file    Non-medical: Not on file  Tobacco Use  . Smoking status: Current Every Day Smoker    Packs/day: 1.00    Types: Cigarettes  . Smokeless tobacco: Current User    Types: Chew  Substance and Sexual Activity  . Alcohol use: Yes    Alcohol/week: 24.0 standard drinks    Types: 24 Cans of beer per week  . Drug use: Yes    Types: Marijuana    Comment: heroin, cocaine, acid, pain killers; last use yrs ago marijuana daily use  . Sexual activity: Yes    Partners: Female  Lifestyle  . Physical activity:    Days per week: Not on file    Minutes per session:  Not on file  . Stress: Not on file  Relationships  . Social connections:    Talks on phone: Not on file    Gets together: Not on file    Attends religious service: Not on file    Active member of club or organization: Not on file    Attends meetings of clubs or organizations: Not on file    Relationship status: Not on file  . Intimate partner violence:    Fear of current or ex partner: Not on file    Emotionally abused: Not on file    Physically abused: Not on file    Forced sexual activity: Not on file  Other Topics Concern  . Not on file  Social History Narrative   Lives at home with parents.      Work - none at present, has worked Lawyer - Completed high school      Hobbies - computers, outdoors, fishing      Diet - regular      Exercise - none    Review of Systems: See HPI, otherwise negative ROS  Physical Exam: BP 127/77   Pulse 71   Temp (!) 96.8 F (36 C) (Tympanic)   Resp 18   Ht 5\' 7"  (1.702 m)   Wt 86.2 kg   SpO2 100%   BMI 29.76 kg/m  General:   Alert,  pleasant and cooperative in NAD Head:  Normocephalic and atraumatic. Neck:  Supple; no masses or thyromegaly. Lungs:  Clear throughout to auscultation, normal respiratory effort.    Heart:  +S1, +S2, Regular rate and rhythm, No edema. Abdomen:  Soft, nontender and nondistended. Normal bowel sounds, without guarding, and without rebound.   Neurologic:  Alert and  oriented x4;  grossly normal neurologically.  Impression/Plan: [name] Hazard is here for an EGD for Acid Reflux.  Risks, benefits, limitations, and alternatives regarding the procedure have been reviewed with the patient.  Questions have been answered.  All parties agreeable.   Virgel Manifold, MD  10/10/2017, 8:40 AM

## 2017-10-10 NOTE — Op Note (Signed)
[facility] Gastroenterology Patient Name: Michael [name] Procedure Date: 10/10/2017 9:18 AM MRN: [id number] Account #: [id number] Date of Birth: Aug 02, 1990 Admit Type: Outpatient Age: 27 Room: [facility] ENDO ROOM 3 Gender: Male Note Status: Finalized Procedure:            Upper GI endoscopy Indications:          Dysphagia, Heartburn Providers:            Varnita B. Bonna Gains MD, MD Referring MD:         Angela Adam. Caryl Bis (Referring MD) Medicines:            Monitored Anesthesia Care Complications:        No immediate complications. Procedure:            Pre-Anesthesia Assessment:                       - The risks and benefits of the procedure and the                        sedation options and risks were discussed with the                        patient. All questions were answered and informed                        consent was obtained.                       - Patient identification and proposed procedure were                        verified prior to the procedure.                       - ASA Grade Assessment: II - A patient with mild                        systemic disease.                       After obtaining informed consent, the endoscope was                        passed under direct vision. Throughout the procedure,                        the patient's blood pressure, pulse, and oxygen                        saturations were monitored continuously. The Endoscope                        was introduced through the mouth, and advanced to the                        second part of duodenum. The upper GI endoscopy was                        accomplished with ease. The patient tolerated the  procedure well. Findings:      Scattered islands of salmon-colored mucosa were present at 39 cm. No       other visible abnormalities were present. Mucosa was biopsied with a       cold forceps for histology in 4 quadrants. One specimen bottle was sent       to pathology. Biopsies were obtained from the proximal and distal       esophagus with cold forceps for histology of suspected eosinophilic       esophagitis.      2 islands areas of ectopic gastric mucosa were found in the proximal       esophagus, 18 cm from the incisors. Biopsies were taken with a cold       forceps for histology.      The entire examined stomach was normal. Biopsies were taken with a cold       forceps for histology and Helicobacter pylori testing.      There is no endoscopic evidence of hiatal hernia in the entire examined       stomach.      The examined duodenum was normal. Impression:           - Salmon-colored mucosa suspicious for short-segment                        Barrett's esophagus. Biopsied.                       - Ectopic gastric mucosa in the proximal esophagus.                        Biopsied.                       - Normal stomach. Biopsied.                       - Normal examined duodenum. Recommendation:       - Follow an antireflux regimen.                       - Continue present medications.                       - Perform routine esophageal manometry at appointment                        to be scheduled.                       - Perform ambulatory pH and impedance monitoring at                        appointment to be scheduled.                       - The findings and recommendations were discussed with                        the patient.                       - The findings and recommendations were discussed with  the patient's family.                       - Return to my office as previously scheduled.                       - Return to primary care physician in 4 weeks. Procedure Code(s):    --- Professional ---                       [phone], Esophagogastroduodenoscopy, flexible, transoral;                        with biopsy, single or multiple Diagnosis Code(s):    --- Professional ---                       K22.8,  Other specified diseases of esophagus                       Q40.2, Other specified congenital malformations of                        stomach                       R13.10, Dysphagia, unspecified                       R12, Heartburn CPT copyright 2017 American Medical Association. All rights reserved. The codes documented in this report are preliminary and upon coder review may  be revised to meet current compliance requirements.  [name], MD Margretta Sidle B. Bonna Gains MD, MD 10/10/2017 9:47:52 AM This report has been signed electronically. Number of Addenda: 0 Note Initiated On: 10/10/2017 9:18 AM Estimated Blood Loss: Estimated blood loss: none.      [facility]

## 2017-10-10 NOTE — Anesthesia Preprocedure Evaluation (Signed)
Anesthesia Evaluation  Patient identified by MRN, date of birth, ID band Patient awake    Reviewed: Allergy & Precautions, H&P , NPO status , Patient's Chart, lab work & pertinent test results  Airway Mallampati: III  TM Distance: >3 FB Neck ROM: full    Dental no notable dental hx. (+) Teeth Intact   Pulmonary neg pulmonary ROS, asthma , Current Smoker,           Cardiovascular hypertension, negative cardio ROS       Neuro/Psych PSYCHIATRIC DISORDERS Anxiety Depression Bipolar Disorder Schizophrenia negative neurological ROS  negative psych ROS   GI/Hepatic negative GI ROS, Neg liver ROS, hiatal hernia, GERD  ,  Endo/Other  negative endocrine ROS  Renal/GU negative Renal ROS  negative genitourinary   Musculoskeletal   Abdominal   Peds  Hematology negative hematology ROS (+)   Anesthesia Other Findings Past Medical History: No date: Allergy 04/06/2017: Anal fissure No date: Anxiety No date: Asthma off and on: Chest pain No date: Depression No date: Family history of adverse reaction to anesthesia     Comment:  adopted history unknown No date: GERD (gastroesophageal reflux disease) last 2015: Pancreatitis No date: Substance abuse ([place])  Past Surgical History: 08/16/2015: CHOLECYSTECTOMY     Comment:  Procedure: LAPAROSCOPIC CHOLECYSTECTOMY;  Surgeon: Alphonsa Overall, MD;  Location: [facility] ORS;  Service: General;; 07/27/2016: ESOPHAGOGASTRODUODENOSCOPY (EGD) WITH PROPOFOL; N/A     Comment:  Procedure: ESOPHAGOGASTRODUODENOSCOPY (EGD) WITH               PROPOFOL;  Surgeon: Milus Banister, MD;  Location: [facility]               ENDOSCOPY;  Service: Endoscopy;  Laterality: N/A; as child: TONSILLECTOMY AND ADENOIDECTOMY     Comment:  and adenoids     Reproductive/Obstetrics negative OB ROS                            Anesthesia Physical Anesthesia Plan  ASA: III  Anesthesia Plan:  General   Post-op Pain Management:    Induction:   PONV Risk Score and Plan: Propofol infusion  Airway Management Planned:   Additional Equipment:   Intra-op Plan:   Post-operative Plan:   Informed Consent: I have reviewed the patients History and Physical, chart, labs and discussed the procedure including the risks, benefits and alternatives for the proposed anesthesia with the patient or authorized representative who has indicated his/her understanding and acceptance.   Dental Advisory Given  Plan Discussed with: Anesthesiologist, CRNA and Surgeon  Anesthesia Plan Comments:         Anesthesia Quick Evaluation

## 2017-10-10 NOTE — Anesthesia Post-op Follow-up Note (Signed)
Anesthesia QCDR form completed.        

## 2017-10-10 NOTE — Anesthesia Postprocedure Evaluation (Signed)
Anesthesia Post Note  Patient: Michael [name]  Procedure(s) Performed: ESOPHAGOGASTRODUODENOSCOPY (EGD) WITH PROPOFOL (N/A )  Patient location during evaluation: PACU Anesthesia Type: General Level of consciousness: awake and alert Pain management: pain level controlled Vital Signs Assessment: post-procedure vital signs reviewed and stable Respiratory status: spontaneous breathing, nonlabored ventilation and respiratory function stable Cardiovascular status: blood pressure returned to baseline and stable Postop Assessment: no apparent nausea or vomiting Anesthetic complications: no     Last Vitals:  Vitals:   10/10/17 0945 10/10/17 1015  BP: 110/63 122/85  Pulse:    Resp: 15   Temp: (!) 36.1 C   SpO2:      Last Pain:  Vitals:   10/10/17 0945  TempSrc: Tympanic  PainSc: [place]

## 2017-10-11 ENCOUNTER — Encounter: Payer: Self-pay | Admitting: Gastroenterology

## 2017-10-14 ENCOUNTER — Encounter: Payer: Self-pay | Admitting: Gastroenterology

## 2017-10-14 LAB — SURGICAL PATHOLOGY

## 2017-10-16 ENCOUNTER — Encounter: Payer: Self-pay | Admitting: Family Medicine

## 2017-10-16 ENCOUNTER — Ambulatory Visit (INDEPENDENT_AMBULATORY_CARE_PROVIDER_SITE_OTHER): Payer: Medicare Other | Admitting: Family Medicine

## 2017-10-16 VITALS — BP 120/88 | HR 72 | Temp 98.5°F | Wt 195.6 lb

## 2017-10-16 DIAGNOSIS — R232 Flushing: Secondary | ICD-10-CM | POA: Diagnosis not present

## 2017-10-16 DIAGNOSIS — K2100 Gastro-esophageal reflux disease with esophagitis, without bleeding: Secondary | ICD-10-CM

## 2017-10-16 DIAGNOSIS — R1013 Epigastric pain: Secondary | ICD-10-CM | POA: Diagnosis not present

## 2017-10-16 DIAGNOSIS — F3132 Bipolar disorder, current episode depressed, moderate: Secondary | ICD-10-CM

## 2017-10-16 DIAGNOSIS — R61 Generalized hyperhidrosis: Secondary | ICD-10-CM

## 2017-10-16 LAB — COMPREHENSIVE METABOLIC PANEL
ALT: 24 U/L (ref 0–53)
AST: 16 U/L (ref 0–37)
Albumin: 4.8 g/dL (ref 3.5–5.2)
Alkaline Phosphatase: 70 U/L (ref 39–117)
BUN: 13 mg/dL (ref 6–23)
CHLORIDE: 104 meq/L (ref 96–112)
CO2: 26 meq/L (ref 19–32)
CREATININE: 1 mg/dL (ref 0.40–1.50)
Calcium: 10 mg/dL (ref 8.4–10.5)
GFR: 95.43 mL/min (ref >60.00)
Glucose, Bld: 91 mg/dL (ref 70–99)
POTASSIUM: 4.3 meq/L (ref 3.5–5.1)
SODIUM: 137 meq/L (ref 135–145)
Total Bilirubin: 0.5 mg/dL (ref 0.2–1.2)
Total Protein: 7.6 g/dL (ref 6.0–8.3)

## 2017-10-16 LAB — CBC WITH DIFFERENTIAL/PLATELET
BASOS PCT: 0.9 % (ref 0.0–3.0)
Basophils Absolute: 0.1 10*3/uL (ref 0.0–0.1)
Eosinophils Absolute: 0.4 10*3/uL (ref 0.0–0.7)
Eosinophils Relative: 4.6 % (ref 0.0–5.0)
HCT: 48.3 % (ref 39.0–52.0)
Hemoglobin: 16.5 g/dL (ref 13.0–17.0)
LYMPHS ABS: 2.8 10*3/uL (ref 0.7–4.0)
Lymphocytes Relative: 28.8 % (ref 12.0–46.0)
MCHC: 34.2 g/dL (ref 30.0–36.0)
MCV: 94.5 fl (ref 78.0–100.0)
MONO ABS: 1 10*3/uL (ref 0.1–1.0)
Monocytes Relative: 10 % (ref 3.0–12.0)
NEUTROS ABS: 5.3 10*3/uL (ref 1.4–7.7)
NEUTROS PCT: 55.7 % (ref 43.0–77.0)
PLATELETS: 218 10*3/uL (ref 150.0–400.0)
RBC: 5.11 Mil/uL (ref 4.22–5.81)
RDW: 13.6 % (ref 11.5–15.5)
WBC: 9.6 10*3/uL (ref 4.0–10.5)

## 2017-10-16 LAB — LIPASE: Lipase: 7 U/L — ABNORMAL LOW (ref 11.0–59.0)

## 2017-10-16 LAB — TSH: TSH: 2.08 u[IU]/mL (ref 0.35–4.50)

## 2017-10-16 MED ORDER — RABEPRAZOLE SODIUM 20 MG PO TBEC: 20.0000 mg | DELAYED_RELEASE_TABLET | Freq: Every day | ORAL | 1 refills | Status: DC

## 2017-10-16 NOTE — Patient Instructions (Addendum)
Nice to see you. Please contact the psychiatrist to set up an evaluation.  If you develop a plan or intent to harm yourself please call 911 or go to the emergency room. We will get lab work today to evaluate for a cause of your discomfort.  If you develop blood in your stool or start to throw up coffee grounds again please seek medical attention immediately. We will trial AcipHex in place of omeprazole to see if that helps with your symptoms.

## 2017-10-17 ENCOUNTER — Other Ambulatory Visit: Payer: Self-pay

## 2017-10-17 DIAGNOSIS — K219 Gastro-esophageal reflux disease without esophagitis: Secondary | ICD-10-CM

## 2017-10-17 DIAGNOSIS — Q402 Other specified congenital malformations of stomach: Secondary | ICD-10-CM

## 2017-10-17 DIAGNOSIS — K228 Other specified diseases of esophagus: Secondary | ICD-10-CM

## 2017-10-17 DIAGNOSIS — K2289 Other specified disease of esophagus: Secondary | ICD-10-CM

## 2017-10-18 ENCOUNTER — Telehealth: Payer: Self-pay

## 2017-10-18 NOTE — Assessment & Plan Note (Signed)
Patient with bipolar disorder.  Depression has worsened.  He has chronic suicidal ideation though he does not endorse any intent or plan at this time or anytime recently.  He will contact the psychiatrist when he leaves this office.  He was advised to go to the emergency room if he develops any plan or intent to harm himself.  Return precautions.

## 2017-10-18 NOTE — Assessment & Plan Note (Signed)
This continues to be an issue.  The coffee-ground emesis is concerning though he did just have an EGD that did not have any obvious cause for that.  We will transition him to AcipHex to see if that provides better benefit.  We will message his GI physician to determine the next step in management.  We will check lab work as outlined below to evaluate for the epigastric discomfort.  He is given return precautions.
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
## 2017-10-23 ENCOUNTER — Encounter: Payer: Self-pay | Admitting: Family Medicine

## 2017-10-25 ENCOUNTER — Other Ambulatory Visit: Payer: Self-pay | Admitting: Internal Medicine

## 2017-10-25 DIAGNOSIS — F17210 Nicotine dependence, cigarettes, uncomplicated: Secondary | ICD-10-CM

## 2017-10-25 MED ORDER — NICOTINE 21-14-7 MG/24HR TD KIT: PACK | TRANSDERMAL | 0 refills | Status: DC

## 2017-10-25 NOTE — Telephone Encounter (Signed)
error 

## 2017-10-30 ENCOUNTER — Emergency Department
Admission: EM | Admit: 2017-10-30 | Discharge: 2017-10-31 | Disposition: A | Discharge status: Home / Self Care | Payer: Medicare Other | Attending: Student in an Organized Health Care Education/Training Program | Admitting: Student in an Organized Health Care Education/Training Program

## 2017-10-30 ENCOUNTER — Encounter: Payer: Self-pay | Admitting: Emergency Medicine

## 2017-10-30 DIAGNOSIS — Y999 Unspecified external cause status: Secondary | ICD-10-CM | POA: Insufficient documentation

## 2017-10-30 DIAGNOSIS — T25231A Burn of second degree of right toe(s) (nail), initial encounter: Secondary | ICD-10-CM | POA: Diagnosis not present

## 2017-10-30 DIAGNOSIS — T24201A Burn of second degree of unspecified site of right lower limb, except ankle and foot, initial encounter: Secondary | ICD-10-CM | POA: Diagnosis not present

## 2017-10-30 DIAGNOSIS — T24001A Burn of unspecified degree of unspecified site of right lower limb, except ankle and foot, initial encounter: Secondary | ICD-10-CM | POA: Diagnosis present

## 2017-10-30 DIAGNOSIS — Y9389 Activity, other specified: Secondary | ICD-10-CM | POA: Insufficient documentation

## 2017-10-30 DIAGNOSIS — T25221A Burn of second degree of right foot, initial encounter: Secondary | ICD-10-CM | POA: Diagnosis not present

## 2017-10-30 DIAGNOSIS — T23201A Burn of second degree of right hand, unspecified site, initial encounter: Secondary | ICD-10-CM | POA: Diagnosis not present

## 2017-10-30 DIAGNOSIS — Z23 Encounter for immunization: Secondary | ICD-10-CM | POA: Diagnosis not present

## 2017-10-30 DIAGNOSIS — Y929 Unspecified place or not applicable: Secondary | ICD-10-CM | POA: Insufficient documentation

## 2017-10-30 DIAGNOSIS — X088XXA Exposure to other specified smoke, fire and flames, initial encounter: Secondary | ICD-10-CM | POA: Insufficient documentation

## 2017-10-30 DIAGNOSIS — T23231A Burn of second degree of multiple right fingers (nail), not including thumb, initial encounter: Secondary | ICD-10-CM | POA: Diagnosis not present

## 2017-10-30 DIAGNOSIS — T23241A Burn of second degree of multiple right fingers (nail), including thumb, initial encounter: Secondary | ICD-10-CM | POA: Diagnosis not present

## 2017-10-30 DIAGNOSIS — J45909 Unspecified asthma, uncomplicated: Secondary | ICD-10-CM | POA: Diagnosis not present

## 2017-10-30 DIAGNOSIS — T310 Burns involving less than 10% of body surface: Secondary | ICD-10-CM | POA: Diagnosis not present

## 2017-10-30 DIAGNOSIS — I10 Essential (primary) hypertension: Secondary | ICD-10-CM | POA: Diagnosis not present

## 2017-10-30 LAB — CBC WITH DIFFERENTIAL/PLATELET
Basophils Absolute: 0.1 10*3/uL (ref 0–0.1)
Basophils Relative: 1 %
Eosinophils Absolute: 0.3 10*3/uL (ref 0–0.7)
Eosinophils Relative: 3 %
HEMATOCRIT: 45.1 % (ref 40.0–52.0)
Hemoglobin: 16.1 g/dL (ref 13.0–18.0)
LYMPHS PCT: 28 %
Lymphs Abs: 2.9 10*3/uL (ref 1.0–3.6)
MCH: 32.9 pg (ref 26.0–34.0)
MCHC: 35.7 g/dL (ref 32.0–36.0)
MCV: 92 fL (ref 80.0–100.0)
MONO ABS: 1 10*3/uL (ref 0.2–1.0)
MONOS PCT: 10 %
NEUTROS ABS: 6.1 10*3/uL (ref 1.4–6.5)
Neutrophils Relative %: 58 %
Platelets: 223 10*3/uL (ref 150–440)
RBC: 4.9 MIL/uL (ref 4.40–5.90)
RDW: 13.6 % (ref 11.5–14.5)
WBC: 10.4 10*3/uL (ref 3.8–10.6)

## 2017-10-30 LAB — COMPREHENSIVE METABOLIC PANEL
ALT: 36 U/L (ref 0–44)
ANION GAP: 10 (ref 5–15)
AST: 23 U/L (ref 15–41)
Albumin: 4.9 g/dL (ref 3.5–5.0)
Alkaline Phosphatase: 72 U/L (ref 38–126)
BILIRUBIN TOTAL: 1 mg/dL (ref 0.3–1.2)
BUN: 15 mg/dL (ref 6–20)
CO2: 23 mmol/L (ref 22–32)
Calcium: 9.3 mg/dL (ref 8.9–10.3)
Chloride: 106 mmol/L (ref 98–111)
Creatinine, Ser: 0.95 mg/dL (ref 0.61–1.24)
GFR calc Af Amer: >60 mL/min (ref >60)
Glucose, Bld: 102 mg/dL — ABNORMAL HIGH (ref 70–99)
POTASSIUM: 4 mmol/L (ref 3.5–5.1)
Sodium: 139 mmol/L (ref 135–145)
Total Protein: 7.9 g/dL (ref 6.5–8.1)

## 2017-10-30 MED ORDER — SILVER SULFADIAZINE 1 % EX CREA
TOPICAL_CREAM | Freq: Once | CUTANEOUS | Status: AC
  Administered 2017-10-31: via TOPICAL
  Filled 2017-10-30: qty 85

## 2017-10-30 MED ORDER — SODIUM CHLORIDE 0.9 % IV BOLUS
1000.0000 mL | Freq: Once | INTRAVENOUS | Status: AC
  Administered 2017-10-30: 1000 mL via INTRAVENOUS

## 2017-10-30 MED ORDER — ONDANSETRON HCL 4 MG/2ML IJ SOLN
4.0000 mg | Freq: Once | INTRAMUSCULAR | Status: AC
  Administered 2017-10-30: 4 mg via INTRAVENOUS
  Filled 2017-10-30: qty 2

## 2017-10-30 MED ORDER — SODIUM CHLORIDE 0.9 % IV BOLUS: 1000.0000 mL | Freq: Once | INTRAVENOUS | Status: DC

## 2017-10-30 MED ORDER — CLINDAMYCIN PHOSPHATE 600 MG/50ML IV SOLN
600.0000 mg | Freq: Once | INTRAVENOUS | Status: AC
  Administered 2017-10-31: 600 mg via INTRAVENOUS
  Filled 2017-10-30: qty 50

## 2017-10-30 MED ORDER — HYDROMORPHONE HCL 1 MG/ML IJ SOLN
1.0000 mg | Freq: Once | INTRAMUSCULAR | Status: AC
  Administered 2017-10-30: 1 mg via INTRAVENOUS
  Filled 2017-10-30: qty 1
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
## 2017-10-31 DIAGNOSIS — F1121 Opioid dependence, in remission: Secondary | ICD-10-CM | POA: Insufficient documentation

## 2017-10-31 DIAGNOSIS — T24201A Burn of second degree of unspecified site of right lower limb, except ankle and foot, initial encounter: Secondary | ICD-10-CM | POA: Diagnosis not present

## 2017-10-31 MED ORDER — TETANUS-DIPHTH-ACELL PERTUSSIS 5-2.5-18.5 LF-MCG/0.5 IM SUSP
0.5000 mL | Freq: Once | INTRAMUSCULAR | Status: AC
  Administered 2017-10-31: 0.5 mL via INTRAMUSCULAR
  Filled 2017-10-31: qty 0.5

## 2017-10-31 MED ORDER — HYDROCODONE-ACETAMINOPHEN 5-325 MG PO TABS: 1.0000 | ORAL_TABLET | ORAL | 0 refills | Status: DC | PRN

## 2017-10-31 MED ORDER — NAPROXEN 500 MG PO TABS: 500.0000 mg | ORAL_TABLET | Freq: Two times a day (BID) | ORAL | 0 refills | Status: DC

## 2017-10-31 MED ORDER — CLINDAMYCIN HCL 300 MG PO CAPS: 300.0000 mg | ORAL_CAPSULE | Freq: Three times a day (TID) | ORAL | 0 refills | Status: AC

## 2017-10-31 MED ORDER — HYDROMORPHONE HCL 1 MG/ML IJ SOLN
INTRAMUSCULAR | Status: AC
  Administered 2017-10-31: 1 mg via INTRAVENOUS
  Filled 2017-10-31: qty 1

## 2017-10-31 MED ORDER — HYDROMORPHONE HCL 1 MG/ML IJ SOLN
1.0000 mg | Freq: Once | INTRAMUSCULAR | Status: AC
  Administered 2017-10-31: 1 mg via INTRAVENOUS

## 2017-10-31 MED ORDER — SODIUM CHLORIDE 0.9 % IV BOLUS
1000.0000 mL | Freq: Once | INTRAVENOUS | Status: AC
  Administered 2017-10-31: 1000 mL via INTRAVENOUS

## 2017-10-31 MED ORDER — SILVER SULFADIAZINE 1 % EX CREA: 1.0000 "application " | TOPICAL_CREAM | Freq: Every day | CUTANEOUS | 0 refills | Status: DC
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
## 2017-11-01 ENCOUNTER — Encounter: Admission: RE | Payer: Self-pay | Source: Ambulatory Visit

## 2017-11-01 ENCOUNTER — Telehealth: Payer: Self-pay | Admitting: Gastroenterology

## 2017-11-01 ENCOUNTER — Ambulatory Visit: Admission: RE | Admit: 2017-11-01 | Payer: Medicare Other | Source: Ambulatory Visit | Admitting: Gastroenterology

## 2017-11-01 DIAGNOSIS — T310 Burns involving less than 10% of body surface: Secondary | ICD-10-CM | POA: Diagnosis not present

## 2017-11-01 DIAGNOSIS — T24231A Burn of second degree of right lower leg, initial encounter: Secondary | ICD-10-CM | POA: Diagnosis not present

## 2017-11-01 DIAGNOSIS — T23231A Burn of second degree of multiple right fingers (nail), not including thumb, initial encounter: Secondary | ICD-10-CM | POA: Diagnosis not present

## 2017-11-01 SURGERY — MANOMETRY, ESOPHAGUS
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
## 2017-11-02 DIAGNOSIS — G4726 Circadian rhythm sleep disorder, shift work type: Secondary | ICD-10-CM | POA: Insufficient documentation

## 2017-11-02 DIAGNOSIS — T24201A Burn of second degree of unspecified site of right lower limb, except ankle and foot, initial encounter: Secondary | ICD-10-CM | POA: Diagnosis not present

## 2017-11-02 DIAGNOSIS — F430 Acute stress reaction: Secondary | ICD-10-CM | POA: Diagnosis not present

## 2017-11-02 DIAGNOSIS — T23201A Burn of second degree of right hand, unspecified site, initial encounter: Secondary | ICD-10-CM | POA: Diagnosis not present

## 2017-11-02 DIAGNOSIS — Z72811 Adult antisocial behavior: Secondary | ICD-10-CM | POA: Insufficient documentation

## 2017-11-02 DIAGNOSIS — T310 Burns involving less than 10% of body surface: Secondary | ICD-10-CM | POA: Diagnosis not present

## 2017-11-03 DIAGNOSIS — T310 Burns involving less than 10% of body surface: Secondary | ICD-10-CM | POA: Diagnosis not present

## 2017-11-03 DIAGNOSIS — T23231A Burn of second degree of multiple right fingers (nail), not including thumb, initial encounter: Secondary | ICD-10-CM | POA: Diagnosis not present

## 2017-11-03 DIAGNOSIS — T24201A Burn of second degree of unspecified site of right lower limb, except ankle and foot, initial encounter: Secondary | ICD-10-CM | POA: Diagnosis not present

## 2017-11-04 DIAGNOSIS — T310 Burns involving less than 10% of body surface: Secondary | ICD-10-CM | POA: Diagnosis not present

## 2017-11-04 DIAGNOSIS — T23231A Burn of second degree of multiple right fingers (nail), not including thumb, initial encounter: Secondary | ICD-10-CM | POA: Diagnosis not present

## 2017-11-04 DIAGNOSIS — T24201A Burn of second degree of unspecified site of right lower limb, except ankle and foot, initial encounter: Secondary | ICD-10-CM | POA: Diagnosis not present

## 2017-11-05 DIAGNOSIS — T24231A Burn of second degree of right lower leg, initial encounter: Secondary | ICD-10-CM | POA: Diagnosis not present

## 2017-11-05 DIAGNOSIS — R05 Cough: Secondary | ICD-10-CM | POA: Diagnosis not present

## 2017-11-05 DIAGNOSIS — T310 Burns involving less than 10% of body surface: Secondary | ICD-10-CM | POA: Diagnosis not present

## 2017-11-05 DIAGNOSIS — T300 Burn of unspecified body region, unspecified degree: Secondary | ICD-10-CM | POA: Diagnosis not present

## 2017-11-06 DIAGNOSIS — T24231A Burn of second degree of right lower leg, initial encounter: Secondary | ICD-10-CM | POA: Diagnosis not present

## 2017-11-06 DIAGNOSIS — T310 Burns involving less than 10% of body surface: Secondary | ICD-10-CM | POA: Diagnosis not present

## 2017-11-09 ENCOUNTER — Encounter: Payer: Self-pay | Admitting: Family Medicine

## 2017-11-09 NOTE — Progress Notes (Signed)
Prior authorization for Nicotine patches have been denied by patient insurance at this time due to Medicare part D regulations.

## 2017-11-14 DIAGNOSIS — T24201A Burn of second degree of unspecified site of right lower limb, except ankle and foot, initial encounter: Secondary | ICD-10-CM | POA: Diagnosis not present

## 2017-11-22 DIAGNOSIS — T23231D Burn of second degree of multiple right fingers (nail), not including thumb, subsequent encounter: Secondary | ICD-10-CM | POA: Diagnosis not present

## 2017-11-22 DIAGNOSIS — T24201D Burn of second degree of unspecified site of right lower limb, except ankle and foot, subsequent encounter: Secondary | ICD-10-CM | POA: Diagnosis not present

## 2017-11-22 DIAGNOSIS — I998 Other disorder of circulatory system: Secondary | ICD-10-CM | POA: Diagnosis not present

## 2017-11-22 DIAGNOSIS — T310 Burns involving less than 10% of body surface: Secondary | ICD-10-CM | POA: Diagnosis not present

## 2017-12-04 DIAGNOSIS — F331 Major depressive disorder, recurrent, moderate: Secondary | ICD-10-CM | POA: Diagnosis not present

## 2017-12-04 DIAGNOSIS — Z79899 Other long term (current) drug therapy: Secondary | ICD-10-CM | POA: Diagnosis not present

## 2017-12-14 ENCOUNTER — Telehealth: Payer: Self-pay
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
## 2017-12-26 NOTE — Telephone Encounter (Signed)
Pt left vm   for Debbie to schedule apt

## 2017-12-31 ENCOUNTER — Telehealth: Payer: Self-pay | Admitting: Gastroenterology

## 2017-12-31 ENCOUNTER — Other Ambulatory Visit: Payer: Self-pay

## 2017-12-31 DIAGNOSIS — K2289 Other specified disease of esophagus: Secondary | ICD-10-CM

## 2017-12-31 DIAGNOSIS — K219 Gastro-esophageal reflux disease without esophagitis: Secondary | ICD-10-CM

## 2017-12-31 DIAGNOSIS — Q402 Other specified congenital malformations of stomach: Secondary | ICD-10-CM

## 2017-12-31 DIAGNOSIS — K228 Other specified diseases of esophagus: Secondary | ICD-10-CM

## 2017-12-31 NOTE — Telephone Encounter (Signed)
Spoke with pt mother Benjamine Mola) and we can schedule appt anytime but Nov. 14 and 27 due to previous appts.  Went over medications and she states pt is taking a lot of pepto bismol but no other GI medications. Refrain from caffeine such as chocolate, coffee, tea, and cola for 48 hrs. prior to [facility] testing. Nothing to eat or drink 8 hrs prior to procedure-manometry. If mother is not available when I return call with appointment, it is okay to leave message.   Manometry and PH Impedance study scheduled for 01/15/2018. Left Benjamine Mola (mother) message when procedure is and to contact [facility] the day before procedure.

## 2017-12-31 NOTE — Telephone Encounter (Signed)
I had just left voice mail as to when manometry was scheduled when I saw this message. [email or web address].
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
## 2018-01-03 DIAGNOSIS — T24231D Burn of second degree of right lower leg, subsequent encounter: Secondary | ICD-10-CM | POA: Diagnosis not present

## 2018-01-03 DIAGNOSIS — T24201D Burn of second degree of unspecified site of right lower limb, except ankle and foot, subsequent encounter: Secondary | ICD-10-CM | POA: Diagnosis not present

## 2018-01-03 DIAGNOSIS — T23231D Burn of second degree of multiple right fingers (nail), not including thumb, subsequent encounter: Secondary | ICD-10-CM | POA: Diagnosis not present

## 2018-01-03 DIAGNOSIS — T310 Burns involving less than 10% of body surface: Secondary | ICD-10-CM | POA: Diagnosis not present

## 2018-01-14 NOTE — Telephone Encounter (Signed)
Left message for mother Hillman Attig) to contact office to let us know if pt is able to have his manometry study tomorrow. Endo has not had any success contacting her.

## 2018-01-15 ENCOUNTER — Ambulatory Visit
Admission: RE | Admit: 2018-01-15 | Discharge: 2018-01-15 | Disposition: A | Discharge status: Home / Self Care | Payer: Medicare Other | Source: Ambulatory Visit | Attending: Gastroenterology | Admitting: Gastroenterology

## 2018-01-15 ENCOUNTER — Encounter
Admission: RE | Disposition: A | Discharge status: Home / Self Care | Payer: Self-pay | Source: Ambulatory Visit | Attending: Gastroenterology

## 2018-01-15 DIAGNOSIS — K228 Other specified diseases of esophagus: Secondary | ICD-10-CM | POA: Diagnosis not present

## 2018-01-15 DIAGNOSIS — Q402 Other specified congenital malformations of stomach: Secondary | ICD-10-CM | POA: Diagnosis not present

## 2018-01-15 DIAGNOSIS — R1310 Dysphagia, unspecified: Secondary | ICD-10-CM | POA: Insufficient documentation

## 2018-01-15 DIAGNOSIS — Z538 Procedure and treatment not carried out for other reasons: Secondary | ICD-10-CM | POA: Diagnosis not present

## 2018-01-15 SURGERY — MANOMETRY, ESOPHAGUS

## 2018-01-15 MED ORDER — BUTAMBEN-TETRACAINE-BENZOCAINE 2-2-14 % EX AERO
2.0000 | INHALATION_SPRAY | Freq: Once | CUTANEOUS | Status: AC
  Administered 2018-01-15: 2 via TOPICAL

## 2018-01-15 MED ORDER — LIDOCAINE HCL URETHRAL/MUCOSAL 2 % EX GEL
1.0000 "application " | Freq: Once | CUTANEOUS | Status: AC
  Administered 2018-01-15: 1 via TOPICAL

## 2018-01-15 MED ORDER — BUTAMBEN-TETRACAINE-BENZOCAINE 2-2-14 % EX AERO
INHALATION_SPRAY | CUTANEOUS | Status: AC
  Filled 2018-01-15: qty 5

## 2018-01-15 MED ORDER — LIDOCAINE HCL URETHRAL/MUCOSAL 2 % EX GEL
CUTANEOUS | Status: AC
  Filled 2018-01-15: qty 5

## 2018-01-15 NOTE — Telephone Encounter (Signed)
Pt showed up for manometry appt

## 2018-01-15 NOTE — OR Nursing (Signed)
Patient unable to tolerate placement of Esophageal Manometry Probe therefore the Esophageal Manometry Procedure ordered per Dr Bonna Gains was aborted.  Multiple attempts were made to place the probe via right and left nare without success.  Procedure was aborted per patient request.  Will make Dr Bonna Gains aware.

## 2018-01-16 DIAGNOSIS — F331 Major depressive disorder, recurrent, moderate: Secondary | ICD-10-CM | POA: Diagnosis not present

## 2018-01-23 ENCOUNTER — Telehealth: Payer: Self-pay | Admitting: Gastroenterology

## 2018-01-23 NOTE — Telephone Encounter (Signed)
LMTCO.

## 2018-01-23 NOTE — Telephone Encounter (Signed)
Pt mother is calling regarding pt to speak to [facility]

## 2018-01-24 ENCOUNTER — Ambulatory Visit (INDEPENDENT_AMBULATORY_CARE_PROVIDER_SITE_OTHER): Payer: Medicare Other | Admitting: Family Medicine

## 2018-01-24 ENCOUNTER — Encounter: Payer: Self-pay | Admitting: Family Medicine

## 2018-01-24 VITALS — BP 140/92 | HR 93 | Temp 98.1°F | Ht 67.0 in | Wt 198.1 lb

## 2018-01-24 DIAGNOSIS — R05 Cough: Secondary | ICD-10-CM | POA: Diagnosis not present

## 2018-01-24 DIAGNOSIS — J019 Acute sinusitis, unspecified: Secondary | ICD-10-CM | POA: Diagnosis not present

## 2018-01-24 DIAGNOSIS — R059 Cough, unspecified: Secondary | ICD-10-CM

## 2018-01-24 DIAGNOSIS — R0982 Postnasal drip: Secondary | ICD-10-CM

## 2018-01-24 DIAGNOSIS — J3489 Other specified disorders of nose and nasal sinuses: Secondary | ICD-10-CM | POA: Diagnosis not present

## 2018-01-24 MED ORDER — DOXYCYCLINE HYCLATE 100 MG PO TABS: 100.0000 mg | ORAL_TABLET | Freq: Two times a day (BID) | ORAL | 0 refills | Status: DC

## 2018-01-24 NOTE — Progress Notes (Signed)
Subjective:    Patient ID: Michael [name], male    DOB: [birth year], 27 y.o.   MRN: [id number]  HPI  Patient presents to clinic complaining of sinus congestion/pressure, thick yellow nasal drainage, cough, head feeling full for the past 3 weeks.  Patient has been trying to combat symptoms with over-the-counter cold medications including Mucinex, cough drops, saline nasal spray, however symptoms continue to persist.  Patient denies any fever or chills.  Denies nausea, vomiting or diarrhea.  Denies shortness of breath or wheezing.  Patient Active Problem List   Diagnosis Date Noted  . CLE (columnar lined esophagus)   . Ectopic gastric tissue   . Heartburn   . Generalized abdominal pain 08/09/2017  . Hiatal hernia 08/09/2017  . Mild intermittent asthma 08/09/2017  . Nausea and vomiting 08/09/2017  . Dysphagia   . Benign esophageal stricture   . GERD (gastroesophageal reflux disease) 07/20/2016  . Cough 06/22/2016  . Colon polyps 03/13/2016  . Hypertension 01/26/2016  . Chest pain 01/26/2016  . Palpitations 01/26/2016  . Anxiety 11/19/2015  . Chronic diarrhea 11/19/2015  . Syncope 11/19/2015  . Bronchitis 10/26/2015  . Hand injury 10/18/2015  . Rectal pain 06/21/2015  . Depressed 06/21/2015  . Substance induced mood disorder ([place]) 10/09/2014  . Alcohol abuse 10/09/2014  . Marijuana abuse 10/09/2014  . Bipolar disorder ([place]) 10/09/2014  . Psychosis ([place]) 06/03/2012   Social History   Tobacco Use  . Smoking status: Current Every Day Smoker    Packs/day: 1.00    Types: Cigarettes  . Smokeless tobacco: Current User    Types: Chew  Substance Use Topics  . Alcohol use: Yes    Alcohol/week: 24.0 standard drinks    Types: 24 Cans of beer per week    Review of Systems  Constitutional: Negative for chills, fatigue and fever.  HENT: +congestion, thick nasal drainage, sinus pain and sore throat.   Eyes: Negative.   Respiratory: Negative for cough, shortness of breath and  wheezing.   Cardiovascular: Negative for chest pain, palpitations and leg swelling.  Gastrointestinal: Negative for abdominal pain, diarrhea, nausea and vomiting.  Genitourinary: Negative for dysuria, frequency and urgency.  Musculoskeletal: Negative for arthralgias and myalgias.  Skin: Negative for color change, pallor and rash.  Neurological: Negative for syncope, light-headedness and headaches.  Psychiatric/Behavioral: The patient is not nervous/anxious.       Objective:   Physical Exam  Constitutional: He is oriented to person, place, and time. No distress.  HENT:  Nose: Mucosal edema, rhinorrhea and sinus tenderness present. Right sinus exhibits maxillary sinus tenderness and frontal sinus tenderness. Left sinus exhibits maxillary sinus tenderness and frontal sinus tenderness.  Thick yellow nasal drainage and postnasal drip patterning in back of throat.  Eyes: Conjunctivae and EOM are normal. Right eye exhibits no discharge.  Neck: Neck supple. No tracheal deviation present.  Cardiovascular: Normal rate and regular rhythm.  Pulmonary/Chest: Effort normal and breath sounds normal.  Musculoskeletal: Normal range of motion. He exhibits no edema.  Neurological: He is alert and oriented to person, place, and time.  Skin: Skin is warm and dry. He is not diaphoretic. No pallor.  Psychiatric: He has a normal mood and affect. His behavior is normal.  Nursing note and vitals reviewed.     Vitals:   01/24/18 0919  BP: (!) 140/92  Pulse: 93  Temp: 98.1 F (36.7 C)  SpO2: 97%   Assessment & Plan:    Acute sinusitis/sinus pain- patient will take doxycycline twice  daily for 10 days.  Doxycycline chosen due to patient being penicillin allergic.  Patient advised to begin taking a daily over-the-counter antihistamine such as Claritin or Allegra to help dry up congestion symptoms and to start doing saline nasal flushes with something like a Nettie pot to help wash out congestion.  Also  advised to rest, do good handwashing and increase fluid intake. Paper Rx for doxycycline written and given to patient due to epic being down at time of office visit.  Postnasal drip/cough- lungs are clear on exam so I suspect cough is directly related to postnasal drip.  Patient advised he can continue to use Mucinex as this is a good cough suppressant and will help thin secretions.   Keep regularly scheduled follow-up with PCP as planned.  Return to clinic sooner if any issues arise or if current symptoms persist or worsen

## 2018-02-22 ENCOUNTER — Encounter: Payer: Self-pay | Admitting: Family Medicine

## 2018-03-01 ENCOUNTER — Ambulatory Visit: Payer: Medicare Other | Admitting: Family Medicine

## 2018-03-04 ENCOUNTER — Ambulatory Visit: Payer: Medicare Other | Admitting: Family

## 2018-03-04 ENCOUNTER — Ambulatory Visit (INDEPENDENT_AMBULATORY_CARE_PROVIDER_SITE_OTHER): Payer: Medicare Other | Admitting: Family

## 2018-03-04 ENCOUNTER — Encounter: Payer: Self-pay | Admitting: Family

## 2018-03-04 VITALS — BP 118/78 | HR 88 | Temp 98.0°F | Wt 201.2 lb

## 2018-03-04 DIAGNOSIS — F331 Major depressive disorder, recurrent, moderate: Secondary | ICD-10-CM | POA: Diagnosis not present

## 2018-03-04 DIAGNOSIS — F410 Panic disorder [episodic paroxysmal anxiety] without agoraphobia: Secondary | ICD-10-CM | POA: Diagnosis not present

## 2018-03-04 DIAGNOSIS — M7062 Trochanteric bursitis, left hip: Secondary | ICD-10-CM | POA: Diagnosis not present

## 2018-03-04 MED ORDER — MELOXICAM 7.5 MG PO TABS: 7.5000 mg | ORAL_TABLET | Freq: Every day | ORAL | 1 refills | Status: DC

## 2018-03-04 NOTE — Patient Instructions (Signed)
Start mobic with food.  This is the same medication as advil, ibuprofen, so you may stop other anti inflammatory medications.  Today we discussed referrals, orders. Orthopedics    I have placed these orders in the system for you.  Please be sure to give [place] a call if you have not heard from our office regarding this. We should hear from [place] within ONE week with information regarding your appointment. If not, please let me know immediately.   Please ensure follow up with Dr Caryl Bis .    Sacroiliac Joint Dysfunction  Sacroiliac joint dysfunction is a condition that causes inflammation on one or both sides of the sacroiliac (SI) joint. The SI joint connects the lower part of the spine (sacrum) with the two upper portions of the pelvis (ilium). This condition causes deep aching or burning pain in the low back. In some cases, the pain may also spread into one or both buttocks, hips, or thighs. What are the causes? This condition may be caused by: Pregnancy. During pregnancy, extra stress is put on the SI joints because the pelvis widens. Injury, such as: Injuries from car accidents. Sports-related injuries. Work-related injuries. Having one leg that is shorter than the other. Conditions that affect the joints, such as: Rheumatoid arthritis. Gout. Psoriatic arthritis. Joint infection (septic arthritis). Sometimes, the cause of SI joint dysfunction is not known. What are the signs or symptoms? Symptoms of this condition include: Aching or burning pain in the lower back. The pain may also spread to other areas, such as: Buttocks. Groin. Thighs. Muscle spasms in or around the painful areas. Increased pain when standing, walking, running, stair climbing, bending, or lifting. How is this diagnosed? This condition is diagnosed with a physical exam and medical history. During the exam, the health care provider may move one or both of your legs to different positions to check for pain. Various  tests may be done to confirm the diagnosis, including: Imaging tests to look for other causes of pain. These may include: MRI. CT scan. Bone scan. Diagnostic injection. A numbing medicine is injected into the SI joint using a needle. If your pain is temporarily improved or stopped after the injection, this can indicate that SI joint dysfunction is the problem. How is this treated? Treatment depends on the cause and severity of your condition. Treatment options may include: Ice or heat applied to the lower back area after an injury. This may help reduce pain and muscle spasms. Medicines to relieve pain or inflammation or to relax the muscles. Wearing a back brace (sacroiliac brace) to help support the joint while your back is healing. Physical therapy to increase muscle strength around the joint and flexibility at the joint. This may also involve learning proper body positions and ways of moving to relieve stress on the joint. Direct manipulation of the SI joint. Injections of steroid medicine into the joint to reduce pain and swelling. Radiofrequency ablation to burn away nerves that are carrying pain messages from the joint. Use of a device that provides electrical stimulation to help reduce pain at the joint. Surgery to put in screws and plates that limit or prevent joint motion. This is rare. Follow these instructions at home: Medicines Take over-the-counter and prescription medicines only as told by your health care provider. Do not drive or use heavy machinery while taking prescription pain medicine. If you are taking prescription pain medicine, take actions to prevent or treat constipation. Your health care provider may recommend that you: Drink  enough fluid to keep your urine pale yellow. Eat foods that are high in fiber, such as fresh fruits and vegetables, whole grains, and beans. Limit foods that are high in fat and processed sugars, such as fried or sweet foods. Take an  over-the-counter or prescription medicine for constipation. If you have a brace: Wear the brace as told by your health care provider. Remove it only as told by your health care provider. Keep the brace clean. If the brace is not waterproof: Do not let it get wet. Cover it with a watertight covering when you take a bath or a shower. Managing pain, stiffness, and swelling     Icing can help with pain and swelling. Heat may help with muscle tension or spasms. Ask your health care provider if you should use ice or heat. If directed, put ice on the affected area: If you have a removable brace, remove it as told by your health care provider. Put ice in a plastic bag. Place a towel between your skin and the bag. Leave the ice on for 20 minutes, 2-3 times a day. If directed, apply heat to the affected area. Use the heat source that your health care provider recommends, such as a moist heat pack or a heating pad. Place a towel between your skin and the heat source. Leave the heat on for 20-30 minutes. Remove the heat if your skin turns bright red. This is especially important if you are unable to feel pain, heat, or cold. You may have a greater risk of getting burned. General instructions Rest as needed. Ask your health care provider what activities are safe for you. Return to your normal activities as told by your health care provider. Exercise as directed by your health care provider or physical therapist. Do not use any products that contain nicotine or tobacco, such as cigarettes and e-cigarettes. These can delay bone healing. If you need help quitting, ask your health care provider. Keep all follow-up visits as told by your health care provider. This is important. Contact a health care provider if: Your pain is not controlled with medicine. You have a fever. Your pain is getting worse. Get help right away if: You have weakness, numbness, or tingling in your legs or feet. You lose control  of your bladder or bowel. Summary Sacroiliac joint dysfunction is a condition that causes inflammation on one or both sides of the sacroiliac (SI) joint. This condition causes deep aching or burning pain in the low back. In some cases, the pain may also spread into one or both buttocks, hips, or thighs. Treatment depends on the cause and severity of your condition. It may include medicines to reduce pain and swelling or to relax muscles. This information is not intended to replace advice given to you by your health care provider. Make sure you discuss any questions you have with your health care provider. Document Released: 05/05/2008 Document Revised: 03/19/2017 Document Reviewed: 03/19/2017 Elsevier Interactive Patient Education  2019 [employer] Trochanteric Bursitis Trochanteric bursitis is a condition that causes hip pain. Trochanteric bursitis happens when fluid-filled sacs (bursae) in the hip get irritated. Normally these sacs absorb shock and help strong bands of tissue (tendons) in your hip glide smoothly over each other and over your hip bones. What are the causes? This condition results from increased friction between the hip bones and the tendons that go over them. This condition can happen if you:  Have weak hips.  Use your hip muscles too much (  overuse).  Get hit in the hip. What increases the risk? This condition is more likely to develop in:  Women.  Adults who are middle-aged or older.  People with arthritis or a spinal condition.  People with weak buttocks muscles (gluteal muscles).  People who have one leg that is shorter than the other.  People who participate in certain kinds of athletic activities, such as: ? Running sports, especially long-distance running. ? Contact sports, like football or martial arts. ? Sports in which falls may occur, like skiing. What are the signs or symptoms? The main symptom of this condition is pain and tenderness over the point  of your hip. The pain may be:  Sharp and intense.  Dull and achy.  Felt on the outside of your thigh. It may increase when you:  Lie on your side.  Walk or run.  Go up on stairs.  Sit.  Stand up after sitting.  Stand for long periods of time. How is this diagnosed? This condition may be diagnosed based on:  Your symptoms.  Your medical history.  A physical exam.  Imaging tests, such as: ? X-rays to check your bones. ? An MRI or ultrasound to check your tendons and muscles. During your physical exam, your health care provider will check the movement and strength of your hip. He or she may press on the point of your hip to check for pain. How is this treated? This condition may be treated by:  Resting.  Reducing your activity.  Avoiding activities that cause pain.  Using crutches, a cane, or a Yadao to decrease the strain on your hip.  Taking medicine to help with swelling.  Having medicine injected into the bursae to help with swelling.  Using ice, heat, and massage therapy for pain relief.  Physical therapy exercises for strength and flexibility.  Surgery (rare). Follow these instructions at home: Activity  Rest.  Avoid activities that cause pain.  Return to your normal activities as told by your health care provider. Ask your health care provider what activities are safe for you. Managing pain, stiffness, and swelling  Take over-the-counter and prescription medicines only as told by your health care provider.  If directed, apply heat to the injured area as told by your health care provider. ? Place a towel between your skin and the heat source. ? Leave the heat on for 20-30 minutes. ? Remove the heat if your skin turns bright red. This is especially important if you are unable to feel pain, heat, or cold. You may have a greater risk of getting burned.  If directed, apply ice to the injured area: ? Put ice in a plastic bag. ? Place a towel  between your skin and the bag. ? Leave the ice on for 20 minutes, 2-3 times a day. General instructions  If the affected leg is one that you use for driving, ask your health care provider when it is safe to drive.  Use crutches, a cane, or a Heider as told by your health care provider.  If one of your legs is shorter than the other, get fitted for a shoe insert.  Lose weight if you are overweight. How is this prevented?  Wear supportive footwear that is appropriate for your sport.  If you have hip pain, start any new exercise or sport slowly.  Maintain physical fitness, including: ? Strength. ? Flexibility. Contact a health care provider if:  Your pain does not improve with 2-4 weeks. Get help  right away if:  You develop severe pain.  You have a fever.  You develop increased redness over your hip.  You have a change in your bowel function or bladder function.  You cannot control the muscles in your feet. This information is not intended to replace advice given to you by your health care provider. Make sure you discuss any questions you have with your health care provider. Document Released: 03/16/2004 Document Revised: 10/13/2015 Document Reviewed: 01/22/2015 Elsevier Interactive Patient Education  2019 [employer].
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
## 2018-03-06 DIAGNOSIS — M7062 Trochanteric bursitis, left hip: Secondary | ICD-10-CM | POA: Insufficient documentation

## 2018-03-06 NOTE — Assessment & Plan Note (Addendum)
Symptoms, HPI consistent with left trochanteric bursitis.  Also suspect degree of SI joint dysfunction.   Reasonable to start trial of prednisone.  Defer imaging until seen by orthopedics.  Referral placed orthopedics.

## 2018-03-07 DIAGNOSIS — T23231D Burn of second degree of multiple right fingers (nail), not including thumb, subsequent encounter: Secondary | ICD-10-CM | POA: Diagnosis not present

## 2018-03-07 DIAGNOSIS — T310 Burns involving less than 10% of body surface: Secondary | ICD-10-CM | POA: Diagnosis not present

## 2018-03-07 DIAGNOSIS — G4726 Circadian rhythm sleep disorder, shift work type: Secondary | ICD-10-CM | POA: Diagnosis not present

## 2018-03-07 DIAGNOSIS — T24201D Burn of second degree of unspecified site of right lower limb, except ankle and foot, subsequent encounter: Secondary | ICD-10-CM | POA: Diagnosis not present

## 2018-03-09 ENCOUNTER — Encounter: Payer: Self-pay | Admitting: Family

## 2018-03-14 ENCOUNTER — Encounter: Payer: Self-pay | Admitting: Family Medicine

## 2018-03-14 DIAGNOSIS — K219 Gastro-esophageal reflux disease without esophagitis: Secondary | ICD-10-CM

## 2018-03-15 DIAGNOSIS — M5442 Lumbago with sciatica, left side: Secondary | ICD-10-CM | POA: Diagnosis not present

## 2018-03-15 DIAGNOSIS — G8929 Other chronic pain: Secondary | ICD-10-CM | POA: Diagnosis not present

## 2018-03-15 DIAGNOSIS — M955 Acquired deformity of pelvis: Secondary | ICD-10-CM | POA: Diagnosis not present

## 2018-03-15 DIAGNOSIS — M545 Low back pain: Secondary | ICD-10-CM | POA: Diagnosis not present

## 2018-03-15 DIAGNOSIS — M25552 Pain in left hip: Secondary | ICD-10-CM | POA: Diagnosis not present

## 2018-03-15 DIAGNOSIS — M533 Sacrococcygeal disorders, not elsewhere classified: Secondary | ICD-10-CM | POA: Diagnosis not present

## 2018-03-15 NOTE — Telephone Encounter (Signed)
Refer to message to Dr. Caryl Bis for 03/14/2018 where pt requested to change GI doctor. Was referred to [facility].

## 2018-04-04 DIAGNOSIS — F331 Major depressive disorder, recurrent, moderate: Secondary | ICD-10-CM | POA: Diagnosis not present

## 2018-04-04 DIAGNOSIS — F410 Panic disorder [episodic paroxysmal anxiety] without agoraphobia: Secondary | ICD-10-CM | POA: Diagnosis not present

## 2018-05-02 DIAGNOSIS — F410 Panic disorder [episodic paroxysmal anxiety] without agoraphobia: Secondary | ICD-10-CM | POA: Diagnosis not present

## 2018-05-02 DIAGNOSIS — F331 Major depressive disorder, recurrent, moderate: Secondary | ICD-10-CM | POA: Diagnosis not present

## 2018-05-20 ENCOUNTER — Encounter: Payer: Self-pay | Admitting: Family Medicine

## 2018-05-22 ENCOUNTER — Ambulatory Visit: Payer: Self-pay | Admitting: Family Medicine

## 2018-05-30 DIAGNOSIS — M792 Neuralgia and neuritis, unspecified: Secondary | ICD-10-CM | POA: Diagnosis not present

## 2018-05-30 DIAGNOSIS — G8929 Other chronic pain: Secondary | ICD-10-CM | POA: Diagnosis not present

## 2018-05-30 DIAGNOSIS — M545 Low back pain: Secondary | ICD-10-CM | POA: Diagnosis not present

## 2018-05-30 DIAGNOSIS — Z79899 Other long term (current) drug therapy: Secondary | ICD-10-CM | POA: Diagnosis not present

## 2018-06-11 DIAGNOSIS — M792 Neuralgia and neuritis, unspecified: Secondary | ICD-10-CM | POA: Diagnosis not present

## 2018-06-11 DIAGNOSIS — M545 Low back pain: Secondary | ICD-10-CM | POA: Diagnosis not present

## 2018-06-11 DIAGNOSIS — Z79899 Other long term (current) drug therapy: Secondary | ICD-10-CM | POA: Diagnosis not present

## 2018-06-11 DIAGNOSIS — G8929 Other chronic pain: Secondary | ICD-10-CM | POA: Diagnosis not present

## 2018-06-13 DIAGNOSIS — F331 Major depressive disorder, recurrent, moderate: Secondary | ICD-10-CM | POA: Diagnosis not present

## 2018-06-13 DIAGNOSIS — F410 Panic disorder [episodic paroxysmal anxiety] without agoraphobia: Secondary | ICD-10-CM | POA: Diagnosis not present

## 2018-07-08 DIAGNOSIS — M792 Neuralgia and neuritis, unspecified: Secondary | ICD-10-CM | POA: Diagnosis not present

## 2018-07-08 DIAGNOSIS — M545 Low back pain: Secondary | ICD-10-CM | POA: Diagnosis not present

## 2018-07-08 DIAGNOSIS — G4700 Insomnia, unspecified: Secondary | ICD-10-CM | POA: Diagnosis not present

## 2018-07-08 DIAGNOSIS — Z79899 Other long term (current) drug therapy: Secondary | ICD-10-CM | POA: Diagnosis not present

## 2018-07-08 DIAGNOSIS — G8929 Other chronic pain: Secondary | ICD-10-CM | POA: Diagnosis not present

## 2018-07-26 ENCOUNTER — Ambulatory Visit (INDEPENDENT_AMBULATORY_CARE_PROVIDER_SITE_OTHER): Payer: Medicare Other | Admitting: Family Medicine

## 2018-07-26 ENCOUNTER — Other Ambulatory Visit: Payer: Self-pay

## 2018-07-26 DIAGNOSIS — M79605 Pain in left leg: Secondary | ICD-10-CM | POA: Diagnosis not present

## 2018-07-26 MED ORDER — METHYLPREDNISOLONE 4 MG PO TBPK: ORAL_TABLET | ORAL | 0 refills | Status: DC
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
## 2018-07-29 ENCOUNTER — Ambulatory Visit
Admission: RE | Admit: 2018-07-29 | Discharge: 2018-07-29 | Disposition: A | Discharge status: Home / Self Care | Payer: Medicare Other | Source: Ambulatory Visit | Attending: Family Medicine | Admitting: Family Medicine

## 2018-07-29 ENCOUNTER — Encounter: Payer: Self-pay | Admitting: Family Medicine

## 2018-07-29 ENCOUNTER — Other Ambulatory Visit: Payer: Self-pay

## 2018-07-29 DIAGNOSIS — M79605 Pain in left leg: Secondary | ICD-10-CM | POA: Insufficient documentation

## 2018-08-06 DIAGNOSIS — G629 Polyneuropathy, unspecified: Secondary | ICD-10-CM | POA: Diagnosis not present

## 2018-08-06 DIAGNOSIS — M792 Neuralgia and neuritis, unspecified: Secondary | ICD-10-CM | POA: Diagnosis not present

## 2018-08-06 DIAGNOSIS — M545 Low back pain: Secondary | ICD-10-CM | POA: Diagnosis not present

## 2018-08-06 DIAGNOSIS — G8929 Other chronic pain: Secondary | ICD-10-CM | POA: Diagnosis not present

## 2018-08-06 DIAGNOSIS — Z79899 Other long term (current) drug therapy: Secondary | ICD-10-CM | POA: Diagnosis not present

## 2018-08-06 DIAGNOSIS — Z1159 Encounter for screening for other viral diseases: Secondary | ICD-10-CM | POA: Diagnosis not present

## 2018-08-08 DIAGNOSIS — R0602 Shortness of breath: Secondary | ICD-10-CM | POA: Diagnosis not present

## 2018-08-08 DIAGNOSIS — G4710 Hypersomnia, unspecified: Secondary | ICD-10-CM | POA: Diagnosis not present

## 2018-08-08 DIAGNOSIS — R0683 Snoring: Secondary | ICD-10-CM | POA: Diagnosis not present

## 2018-08-12 ENCOUNTER — Telehealth: Payer: Self-pay | Admitting: Diagnostic Neuroimaging

## 2018-08-12 ENCOUNTER — Encounter: Payer: Self-pay | Admitting: Diagnostic Neuroimaging

## 2018-08-12 NOTE — Addendum Note (Signed)
Addended by: Minna Antis on: 08/12/2018 11:53 AM   Modules accepted: Orders

## 2018-08-12 NOTE — Telephone Encounter (Signed)
Pt gave consent for VV on the phone/ Pt understands that although there may be some limitations with this type of visit, we will take all precautions to reduce any security or privacy concerns.  Pt understands that this will be treated like an in office visit and we will file with pt's insurance, and there may be a patient responsible charge related to this service. Sent email w link to  [email or web address]

## 2018-08-12 NOTE — Telephone Encounter (Signed)
Called patient and LVM requesting call back to update EMR.  

## 2018-08-13 ENCOUNTER — Other Ambulatory Visit: Payer: Self-pay

## 2018-08-13 ENCOUNTER — Encounter: Payer: Self-pay | Admitting: Diagnostic Neuroimaging

## 2018-08-13 ENCOUNTER — Ambulatory Visit (INDEPENDENT_AMBULATORY_CARE_PROVIDER_SITE_OTHER): Payer: Medicare Other | Admitting: Diagnostic Neuroimaging

## 2018-08-13 DIAGNOSIS — R200 Anesthesia of skin: Secondary | ICD-10-CM
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
## 2018-08-13 NOTE — Telephone Encounter (Signed)
Patient returned your call. He would like a call back on his cell phone number of [phone].

## 2018-08-13 NOTE — Telephone Encounter (Signed)
Spoke with patient and updated EMR. 

## 2018-08-13 NOTE — Addendum Note (Signed)
Addended by: Minna Antis on: 08/13/2018 10:23 AM   Modules accepted: Orders

## 2018-08-14 ENCOUNTER — Telehealth: Payer: Self-pay | Admitting: Diagnostic Neuroimaging

## 2018-08-14 NOTE — Telephone Encounter (Signed)
Medicare/medicaid order sent to [facility]. No auth they will reach out to the patient to schedule.  °

## 2018-08-20 ENCOUNTER — Encounter: Payer: Self-pay | Admitting: Family Medicine

## 2018-08-20 DIAGNOSIS — R200 Anesthesia of skin: Secondary | ICD-10-CM

## 2018-08-20 DIAGNOSIS — M792 Neuralgia and neuritis, unspecified: Secondary | ICD-10-CM

## 2018-08-27 DIAGNOSIS — F410 Panic disorder [episodic paroxysmal anxiety] without agoraphobia: Secondary | ICD-10-CM | POA: Diagnosis not present

## 2018-08-27 DIAGNOSIS — F331 Major depressive disorder, recurrent, moderate: Secondary | ICD-10-CM | POA: Diagnosis not present

## 2018-08-29 ENCOUNTER — Telehealth: Payer: Self-pay | Admitting: *Deleted

## 2018-08-29 NOTE — Telephone Encounter (Signed)
Copied from [place] [id number]. Topic: General - Other >> Aug 29, 2018  9:32 AM Yvette Rack wrote: Reason for CRM: Nia with [facility] stated they received a referral for pt to see Dr. Nigel Sloop but he no longer does consultations. Nia asked if it was ok for pt to be scheduled with another provider. Cb# [phone]

## 2018-08-30 NOTE — Telephone Encounter (Signed)
Nia with [facility] stated they received a referral for pt to see Dr. Nigel Sloop but he no longer does consultations. Nia asked if it was ok for pt to be scheduled with another provider. Cb# [phone] [id number]

## 2018-10-21 IMAGING — DX DG CHEST 2V
2 series · 2 of 2 positions shown · non-contrast
Comparison: 10/26/2015

CLINICAL DATA: Cough and chest congestion for 1 month.

EXAM:
CHEST  2 VIEW

[chest pa]
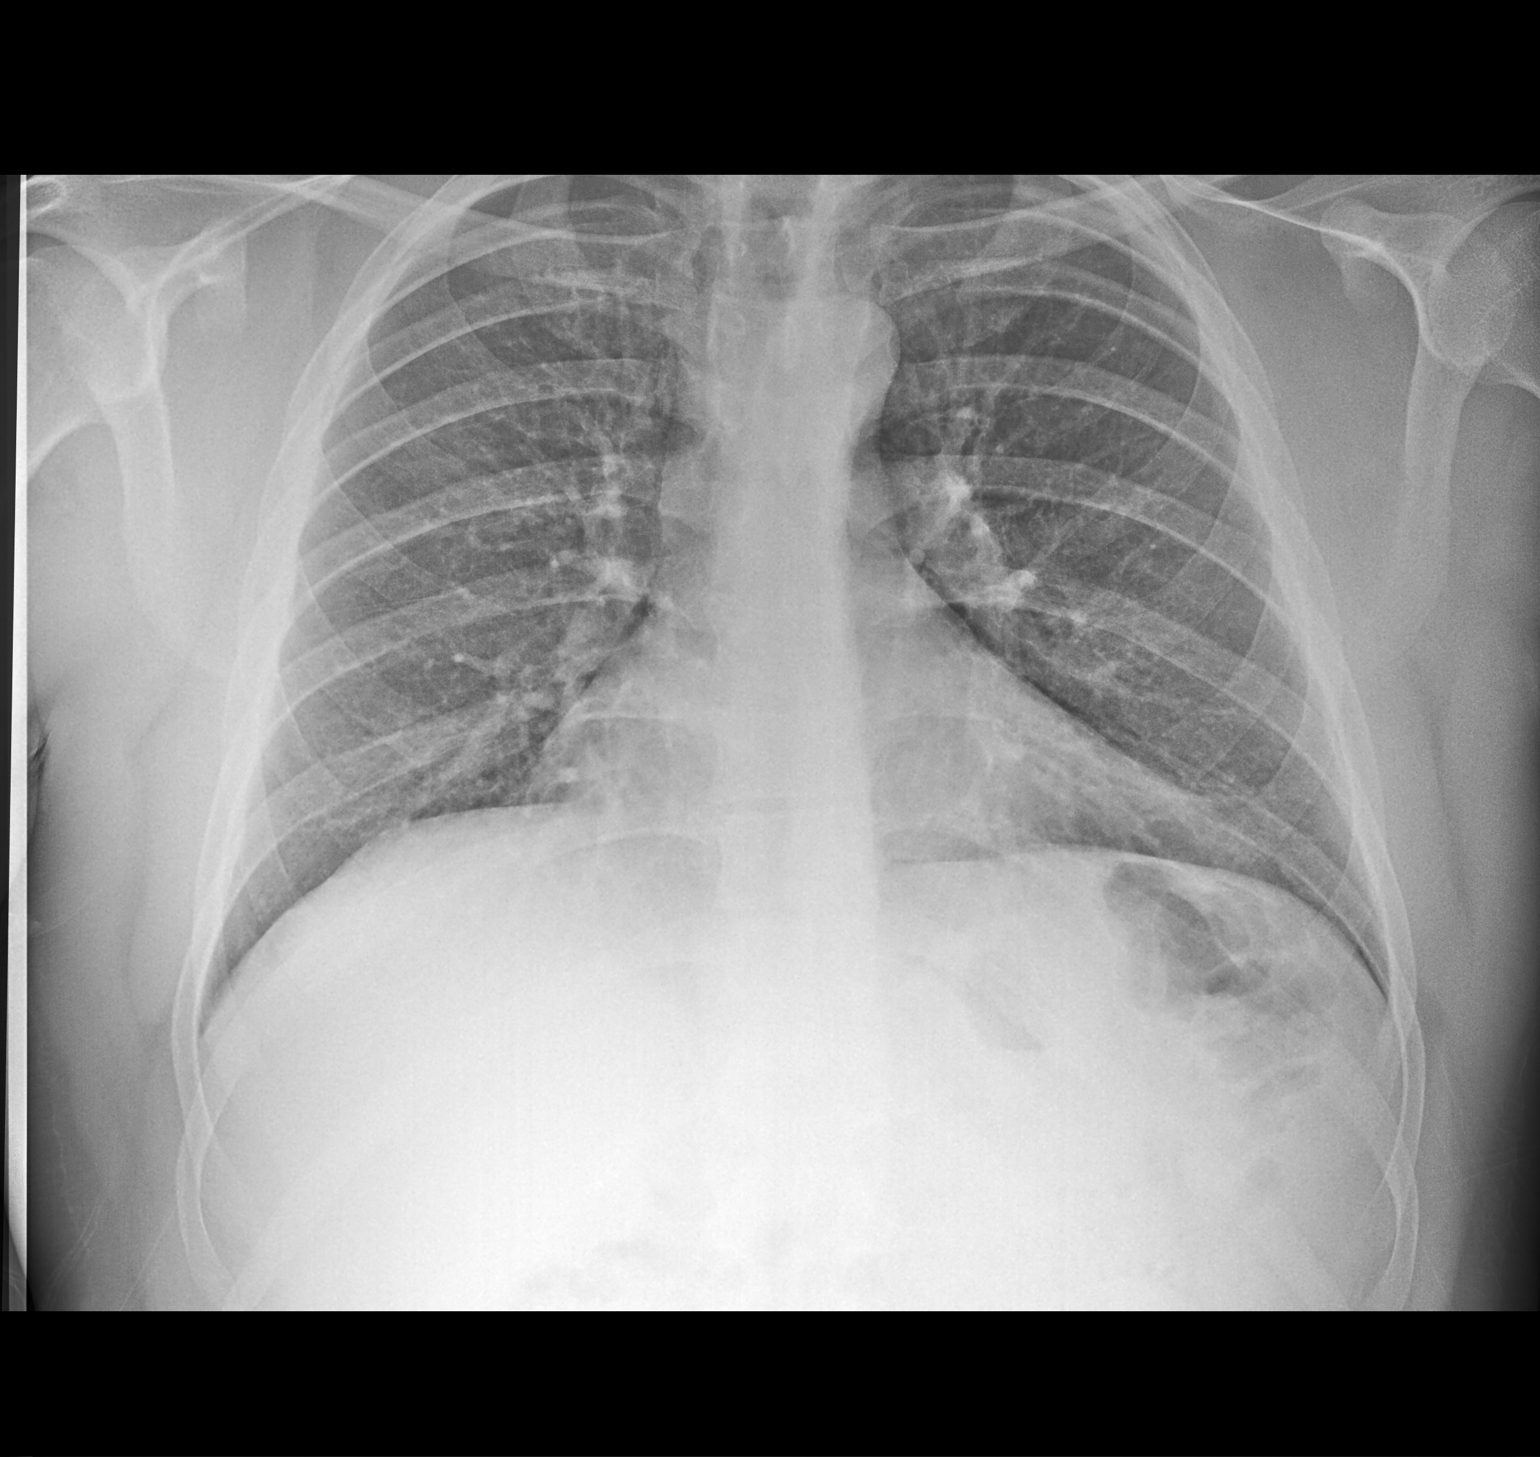

[chest lat]
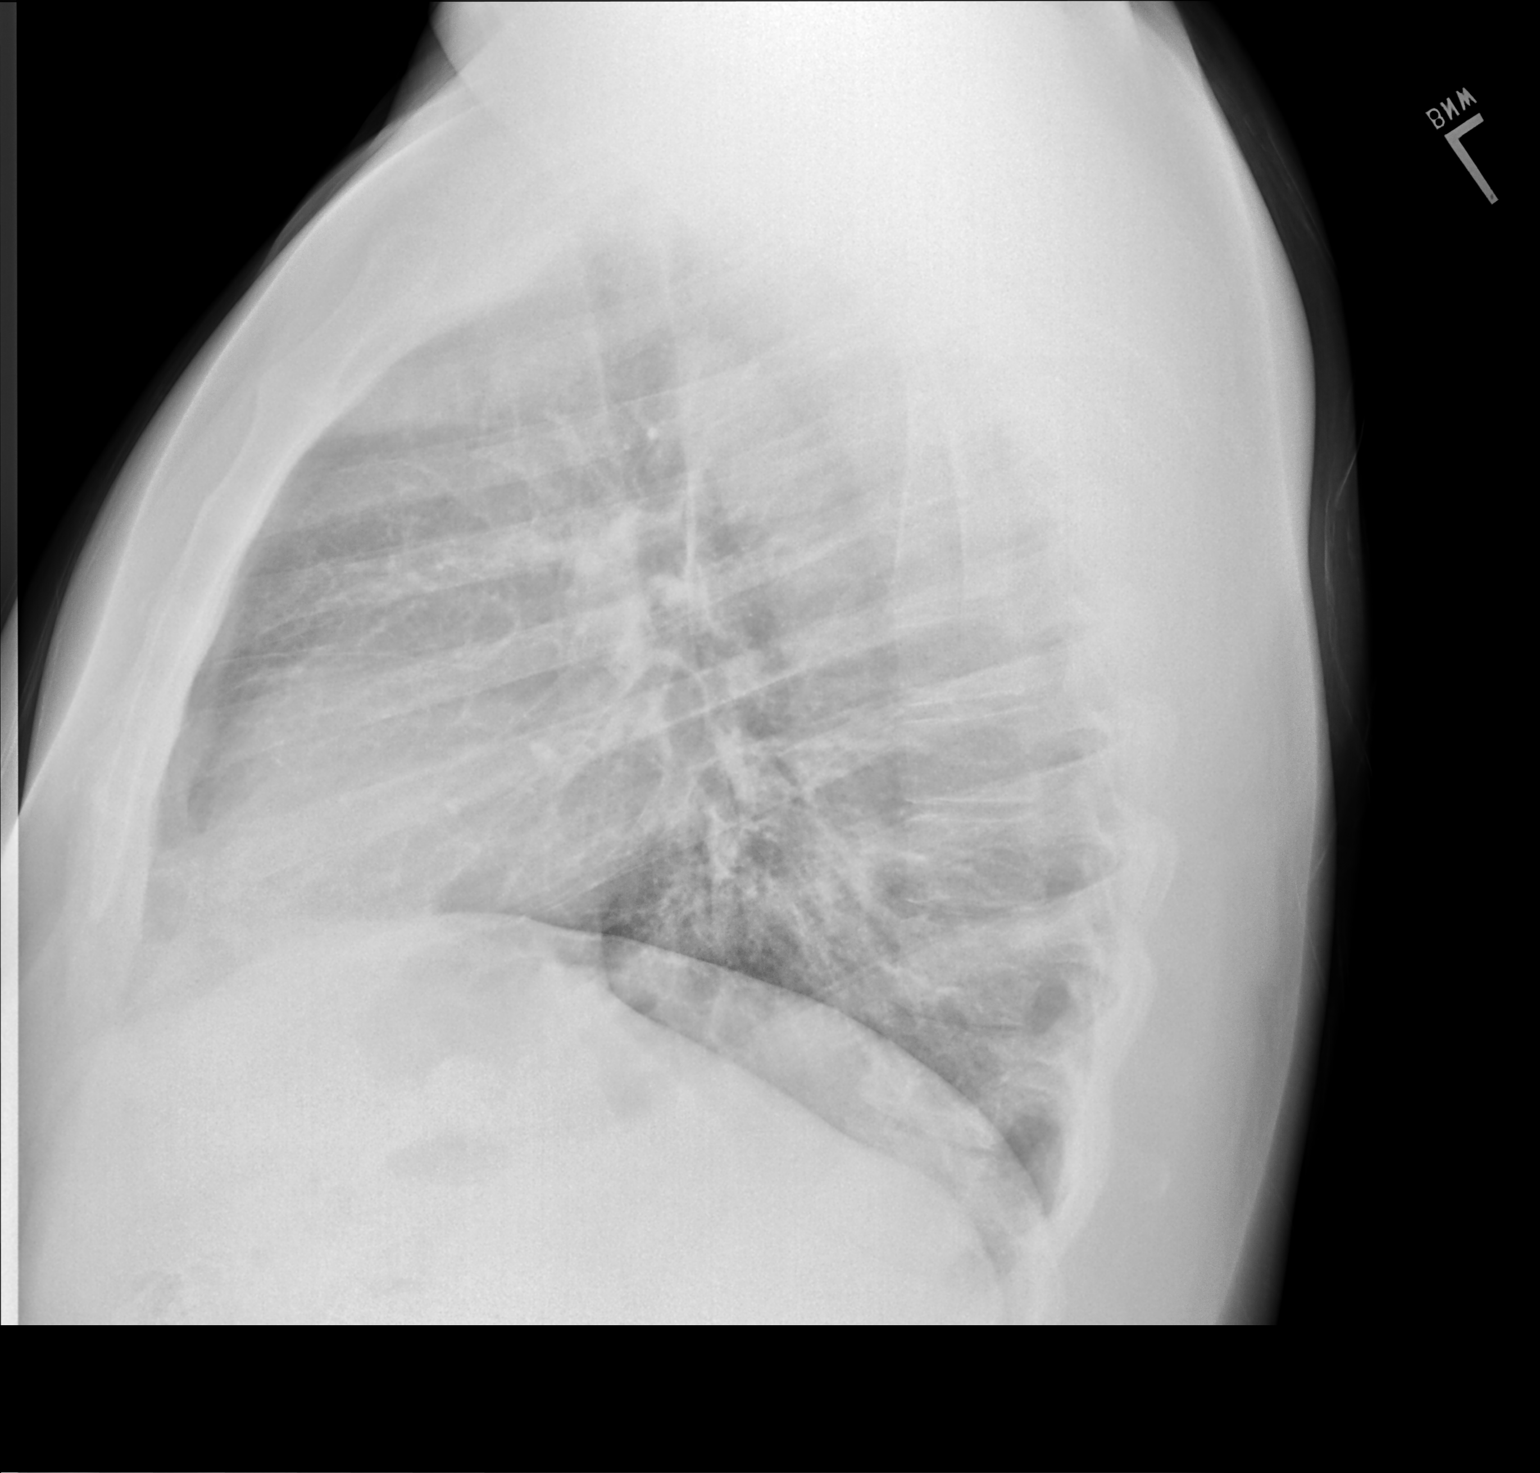

[2 of 2 positions shown; findings below may reference images not displayed]

FINDINGS: The heart size and mediastinal contours are within normal limits.
Both lungs are clear. The visualized skeletal structures are
unremarkable.
IMPRESSION: Negative.  No active cardiopulmonary disease.

## 2018-11-19 DIAGNOSIS — F410 Panic disorder [episodic paroxysmal anxiety] without agoraphobia: Secondary | ICD-10-CM | POA: Diagnosis not present

## 2018-11-19 DIAGNOSIS — F331 Major depressive disorder, recurrent, moderate: Secondary | ICD-10-CM | POA: Diagnosis not present

## 2018-11-19 DIAGNOSIS — Z79899 Other long term (current) drug therapy: Secondary | ICD-10-CM | POA: Diagnosis not present

## 2018-12-31 DIAGNOSIS — F410 Panic disorder [episodic paroxysmal anxiety] without agoraphobia: Secondary | ICD-10-CM | POA: Diagnosis not present

## 2018-12-31 DIAGNOSIS — F331 Major depressive disorder, recurrent, moderate: Secondary | ICD-10-CM | POA: Diagnosis not present

## 2019-02-04 DIAGNOSIS — F410 Panic disorder [episodic paroxysmal anxiety] without agoraphobia: Secondary | ICD-10-CM | POA: Diagnosis not present

## 2019-02-04 DIAGNOSIS — F331 Major depressive disorder, recurrent, moderate: Secondary | ICD-10-CM | POA: Diagnosis not present

## 2019-04-01 DIAGNOSIS — F410 Panic disorder [episodic paroxysmal anxiety] without agoraphobia: Secondary | ICD-10-CM | POA: Diagnosis not present

## 2019-04-01 DIAGNOSIS — F331 Major depressive disorder, recurrent, moderate: Secondary | ICD-10-CM | POA: Diagnosis not present

## 2019-04-01 DIAGNOSIS — F4310 Post-traumatic stress disorder, unspecified: Secondary | ICD-10-CM | POA: Diagnosis not present

## 2019-04-15 ENCOUNTER — Telehealth: Payer: Self-pay | Admitting: Family Medicine

## 2019-04-15 NOTE — Telephone Encounter (Signed)
Called pt. No answer. Phone rang like fax machine. Pt due to schedule Medicare Annual Wellness Visit (AWV) either virtually or audio only.  No hx of AWV; please schedule at anytime with Denisa O'Brien-Blaney at [facility] in 2017

## 2019-04-15 NOTE — Telephone Encounter (Signed)
Please see appointment request has been some time since I have seen him. It really should be a 30 minute visit given his issues. Has he been following with a pain specialist? It looks like he was seeing psychiatry previously. Is he able to see them sooner? If needed there is a 10:30 same day visit on 05/26/19 that could be used to see him sooner.

## 2019-04-16 NOTE — Telephone Encounter (Signed)
Patient returned a call and he was scheduled in a much sooner appt 30 min slot about his medication for anxiety.  Lemond Griffee,cma

## 2019-04-21 ENCOUNTER — Ambulatory Visit: Payer: Medicare Other | Admitting: Family Medicine

## 2019-04-21 DIAGNOSIS — Z20828 Contact with and (suspected) exposure to other viral communicable diseases: Secondary | ICD-10-CM | POA: Diagnosis not present

## 2019-05-14 ENCOUNTER — Ambulatory Visit: Payer: Medicare Other | Admitting: Family Medicine

## 2019-05-16 ENCOUNTER — Ambulatory Visit: Payer: Medicare Other | Attending: Internal Medicine

## 2019-05-16 DIAGNOSIS — Z23 Encounter for immunization: Secondary | ICD-10-CM

## 2019-05-16 NOTE — Progress Notes (Signed)
   Covid-19 Vaccination Clinic  Name:  Michael [name]    MRN: [id number] DOB: [birth year]  05/16/2019  [name] was observed post Covid-19 immunization for 15 minutes without incident. He was provided with Vaccine Information Sheet and instruction to access the V-Safe system.   [name] was instructed to call 911 with any severe reactions post vaccine: Marland Kitchen Difficulty breathing  . Swelling of face and throat  . A fast heartbeat  . A bad rash all over body  . Dizziness and weakness   Immunizations Administered    Name Date Dose VIS Date Route   Moderna COVID-19 Vaccine 05/16/2019  9:52 AM 0.5 mL 01/21/2019 Intramuscular   Manufacturer: Moderna   Lot: [id number]   [id number]

## 2019-06-17 ENCOUNTER — Ambulatory Visit: Payer: Medicare Other | Attending: Internal Medicine

## 2019-06-17 DIAGNOSIS — Z23 Encounter for immunization: Secondary | ICD-10-CM

## 2019-06-17 NOTE — Progress Notes (Signed)
   Covid-19 Vaccination Clinic  Name:  Michael [name]    MRN: [id number] DOB: 03-30-1990  06/17/2019  [name] was observed post Covid-19 immunization for 15 minutes without incident. He was provided with Vaccine Information Sheet and instruction to access the V-Safe system.   [name] was instructed to call 911 with any severe reactions post vaccine: Marland Kitchen Difficulty breathing  . Swelling of face and throat  . A fast heartbeat  . A bad rash all over body  . Dizziness and weakness   Immunizations Administered    Name Date Dose VIS Date Route   Moderna COVID-19 Vaccine 06/17/2019 11:27 AM 0.5 mL 01/2019 Intramuscular   Manufacturer: Moderna   Lot: [id number]   [id number]

## 2019-07-10 ENCOUNTER — Telehealth: Payer: Self-pay | Admitting: Family Medicine

## 2019-07-10 NOTE — Telephone Encounter (Signed)
Lvm to set up appt for physical and also an appt for symptoms he listed on appt request online.

## 2019-07-14 ENCOUNTER — Other Ambulatory Visit: Payer: Self-pay

## 2019-07-14 ENCOUNTER — Telehealth: Payer: Self-pay | Admitting: Nurse Practitioner

## 2019-07-14 ENCOUNTER — Encounter: Payer: Self-pay | Admitting: Nurse Practitioner

## 2019-07-14 ENCOUNTER — Ambulatory Visit (INDEPENDENT_AMBULATORY_CARE_PROVIDER_SITE_OTHER): Payer: Medicare Other | Admitting: Nurse Practitioner

## 2019-07-14 VITALS — BP 138/100 | HR 90 | Temp 98.2°F | Ht 67.0 in | Wt 204.8 lb

## 2019-07-14 DIAGNOSIS — E039 Hypothyroidism, unspecified: Secondary | ICD-10-CM

## 2019-07-14 DIAGNOSIS — R5382 Chronic fatigue, unspecified: Secondary | ICD-10-CM

## 2019-07-14 DIAGNOSIS — R059 Cough, unspecified: Secondary | ICD-10-CM

## 2019-07-14 DIAGNOSIS — L0212 Furuncle of neck: Secondary | ICD-10-CM

## 2019-07-14 DIAGNOSIS — J4520 Mild intermittent asthma, uncomplicated: Secondary | ICD-10-CM

## 2019-07-14 DIAGNOSIS — R05 Cough: Secondary | ICD-10-CM | POA: Diagnosis not present

## 2019-07-14 DIAGNOSIS — R7989 Other specified abnormal findings of blood chemistry: Secondary | ICD-10-CM | POA: Insufficient documentation

## 2019-07-14 DIAGNOSIS — F419 Anxiety disorder, unspecified: Secondary | ICD-10-CM

## 2019-07-14 DIAGNOSIS — D179 Benign lipomatous neoplasm, unspecified: Secondary | ICD-10-CM

## 2019-07-14 DIAGNOSIS — Z6832 Body mass index (BMI) 32.0-32.9, adult: Secondary | ICD-10-CM | POA: Insufficient documentation

## 2019-07-14 LAB — COMPREHENSIVE METABOLIC PANEL
ALT: 38 U/L (ref 0–53)
AST: 20 U/L (ref 0–37)
Albumin: 4.8 g/dL (ref 3.5–5.2)
Alkaline Phosphatase: 64 U/L (ref 39–117)
BUN: 16 mg/dL (ref 6–23)
CO2: 26 mEq/L (ref 19–32)
Calcium: 10 mg/dL (ref 8.4–10.5)
Chloride: 102 mEq/L (ref 96–112)
Creatinine, Ser: 0.97 mg/dL (ref 0.40–1.50)
GFR: 91.82 mL/min (ref >60.00)
Glucose, Bld: 96 mg/dL (ref 70–99)
Potassium: 4.1 mEq/L (ref 3.5–5.1)
Sodium: 138 mEq/L (ref 135–145)
Total Bilirubin: 0.4 mg/dL (ref 0.2–1.2)
Total Protein: 7 g/dL (ref 6.0–8.3)

## 2019-07-14 LAB — CBC WITH DIFFERENTIAL/PLATELET
Basophils Absolute: 0.1 10*3/uL (ref 0.0–0.1)
Basophils Relative: 1 % (ref 0.0–3.0)
Eosinophils Absolute: 0.9 10*3/uL — ABNORMAL HIGH (ref 0.0–0.7)
Eosinophils Relative: 8.4 % — ABNORMAL HIGH (ref 0.0–5.0)
HCT: 43.3 % (ref 39.0–52.0)
Hemoglobin: 15 g/dL (ref 13.0–17.0)
Lymphocytes Relative: 31.3 % (ref 12.0–46.0)
Lymphs Abs: 3.4 10*3/uL (ref 0.7–4.0)
MCHC: 34.6 g/dL (ref 30.0–36.0)
MCV: 94.2 fl (ref 78.0–100.0)
Monocytes Absolute: 1 10*3/uL (ref 0.1–1.0)
Monocytes Relative: 9.3 % (ref 3.0–12.0)
Neutro Abs: 5.5 10*3/uL (ref 1.4–7.7)
Neutrophils Relative %: 50 % (ref 43.0–77.0)
Platelets: 205 10*3/uL (ref 150.0–400.0)
RBC: 4.6 Mil/uL (ref 4.22–5.81)
RDW: 12.9 % (ref 11.5–15.5)
WBC: 10.9 10*3/uL — ABNORMAL HIGH (ref 4.0–10.5)

## 2019-07-14 LAB — TSH: TSH: 4.65 u[IU]/mL — ABNORMAL HIGH (ref 0.35–4.50)

## 2019-07-14 MED ORDER — ALBUTEROL SULFATE HFA 108 (90 BASE) MCG/ACT IN AERS: 1.0000 | INHALATION_SPRAY | Freq: Four times a day (QID) | RESPIRATORY_TRACT | 0 refills | Status: DC | PRN

## 2019-07-14 MED ORDER — DOXYCYCLINE HYCLATE 100 MG PO TABS: 100.0000 mg | ORAL_TABLET | Freq: Two times a day (BID) | ORAL | 0 refills | Status: DC

## 2019-07-14 NOTE — Patient Instructions (Addendum)
It was nice to meet you today.   Please go to the lab today. I will check CBC, thyroid and general chemistry labs today.   Apply warm compresses to the neck lesion. See instruction sheet below.  Do not pick at neck lesion and do not poke with a needle.  If it comes to a head and drains, that is good. Let it drain and keep it clean and dry and covered with gauze dressing. Take the Doxy antibiotic as ordered.   See your primary care provider next week for follow- up.   Call if having fever/chills/ worsening symptoms in the meantime.   I placed a DERMATOLOGY referral for your lipomas.   For your asthma- I refilled your inhaler. For your wheezing and chronic cough, you should respond to the doxycyline. Follow up next week.   Skin Abscess  A skin abscess is an infected area on or under your skin that contains a collection of pus and other material. An abscess may also be called a furuncle, carbuncle, or boil. An abscess can occur in or on almost any part of your body. Some abscesses break open (rupture) on their own. Most continue to get worse unless they are treated. The infection can spread deeper into the body and eventually into your blood, which can make you feel ill. Treatment usually involves draining the abscess. What are the causes? An abscess occurs when germs, like bacteria, pass through your skin and cause an infection. This may be caused by:  A scrape or cut on your skin.  A puncture wound through your skin, including a needle injection or insect bite.  Blocked oil or sweat glands.  Blocked and infected hair follicles.  A cyst that forms beneath your skin (sebaceous cyst) and becomes infected. What increases the risk? This condition is more likely to develop in people who:  Have a weak body defense system (immune system).  Have diabetes.  Have dry and irritated skin.  Get frequent injections or use illegal IV drugs.  Have a foreign body in a wound, such as a  splinter.  Have problems with their lymph system or veins. What are the signs or symptoms? Symptoms of this condition include:  A painful, firm bump under the skin.  A bump with pus at the top. This may break through the skin and drain. Other symptoms include:  Redness surrounding the abscess site.  Warmth.  Swelling of the lymph nodes (glands) near the abscess.  Tenderness.  A sore on the skin. How is this diagnosed? This condition may be diagnosed based on:  A physical exam.  Your medical history.  A sample of pus. This may be used to find out what is causing the infection.  Blood tests.  Imaging tests, such as an ultrasound, CT scan, or MRI. How is this treated? A small abscess that drains on its own may not need treatment. Treatment for larger abscesses may include:  Moist heat or heat pack applied to the area several times a day.  A procedure to drain the abscess (incision and drainage).  Antibiotic medicines. For a severe abscess, you may first get antibiotics through an IV and then change to antibiotics by mouth. Follow these instructions at home: Medicines   Take over-the-counter and prescription medicines only as told by your health care provider.  If you were prescribed an antibiotic medicine, take it as told by your health care provider. Do not stop taking the antibiotic even if you start to feel  better. Abscess care   If you have an abscess that has not drained, apply heat to the affected area. Use the heat source that your health care provider recommends, such as a moist heat pack or a heating pad. ? Place a towel between your skin and the heat source. ? Leave the heat on for 20-30 minutes. ? Remove the heat if your skin turns bright red. This is especially important if you are unable to feel pain, heat, or cold. You may have a greater risk of getting burned.  Follow instructions from your health care provider about how to take care of your abscess.  Make sure you: ? Cover the abscess with a bandage (dressing). ? Change your dressing or gauze as told by your health care provider. ? Wash your hands with soap and water before you change the dressing or gauze. If soap and water are not available, use hand sanitizer.  Check your abscess every day for signs of a worsening infection. Check for: ? More redness, swelling, or pain. ? More fluid or blood. ? Warmth. ? More pus or a bad smell. General instructions  To avoid spreading the infection: ? Do not share personal care items, towels, or hot tubs with others. ? Avoid making skin contact with other people.  Keep all follow-up visits as told by your health care provider. This is important. Contact a health care provider if you have:  More redness, swelling, or pain around your abscess.  More fluid or blood coming from your abscess.  Warm skin around your abscess.  More pus or a bad smell coming from your abscess.  A fever.  Muscle aches.  Chills or a general ill feeling. Get help right away if you:  Have severe pain.  See red streaks on your skin spreading away from the abscess. Summary  A skin abscess is an infected area on or under your skin that contains a collection of pus and other material.  A small abscess that drains on its own may not need treatment.  Treatment for larger abscesses may include having a procedure to drain the abscess and taking an antibiotic. This information is not intended to replace advice given to you by your health care provider. Make sure you discuss any questions you have with your health care provider. Document Revised: 05/30/2018 Document Reviewed: 03/22/2017 Elsevier Patient Education  [place].  http://www.aaaai.org/conditions-and-treatments/asthma">  Asthma, Adult  Asthma is a long-term (chronic) condition that causes recurrent episodes in which the airways become tight and narrow. The airways are the passages that lead  from the nose and mouth down into the lungs. Asthma episodes, also called asthma attacks, can cause coughing, wheezing, shortness of breath, and chest pain. The airways can also fill with mucus. During an attack, it can be difficult to breathe. Asthma attacks can range from minor to life threatening. Asthma cannot be cured, but medicines and lifestyle changes can help control it and treat acute attacks. What are the causes? This condition is believed to be caused by inherited (genetic) and environmental factors, but its exact cause is not known. There are many things that can bring on an asthma attack or make asthma symptoms worse (triggers). Asthma triggers are different for each person. Common triggers include:  Mold.  Dust.  Cigarette smoke.  Cockroaches.  Things that can cause allergy symptoms (allergens), such as animal dander or pollen from trees or grass.  Air pollutants such as household cleaners, wood smoke, smog, or [employer] account planner.  Cold air, weather changes, and winds (which increase molds and pollen in the air).  Strong emotional expressions such as crying or laughing hard.  Stress.  Certain medicines (such as aspirin) or types of medicines (such as beta-blockers).  Sulfites in foods and drinks. Foods and drinks that may contain sulfites include dried fruit, potato chips, and sparkling grape juice.  Infections or inflammatory conditions such as the flu, a cold, or inflammation of the nasal membranes (rhinitis).  Gastroesophageal reflux disease (GERD).  Exercise or strenuous activity. What are the signs or symptoms? Symptoms of this condition may occur right after asthma is triggered or many hours later. Symptoms include:  Wheezing. This can sound like whistling when you breathe.  Excessive nighttime or early morning coughing.  Frequent or severe coughing with a common cold.  Chest tightness.  Shortness of breath.  Tiredness (fatigue) with minimal  activity. How is this diagnosed? This condition is diagnosed based on:  Your medical history.  A physical exam.  Tests, which may include: ? Lung function studies and pulmonary studies (spirometry). These tests can evaluate the flow of air in your lungs. ? Allergy tests. ? Imaging tests, such as X-rays. How is this treated? There is no cure for this condition, but treatment can help control your symptoms. Treatment for asthma usually involves:  Identifying and avoiding your asthma triggers.  Using medicines to control your symptoms. Generally, two types of medicines are used to treat asthma: ? Controller medicines. These help prevent asthma symptoms from occurring. They are usually taken every day. ? Fast-acting reliever or rescue medicines. These quickly relieve asthma symptoms by widening the narrow and tight airways. They are used as needed and provide short-term relief.  Using supplemental oxygen. This may be needed during a severe episode.  Using other medicines, such as: ? Allergy medicines, such as antihistamines, if your asthma attacks are triggered by allergens. ? Immune medicines (immunomodulators). These are medicines that help control the immune system.  Creating an asthma action plan. An asthma action plan is a written plan for managing and treating your asthma attacks. This plan includes: ? A list of your asthma triggers and how to avoid them. ? Information about when medicines should be taken and when their dosage should be changed. ? Instructions about using a device called a peak flow meter. A peak flow meter measures how well the lungs are working and the severity of your asthma. It helps you monitor your condition. Follow these instructions at home: Controlling your home environment Control your home environment in the following ways to help avoid triggers and prevent asthma attacks:  Change your heating and air conditioning filter regularly.  Limit your use of  fireplaces and wood stoves.  Get rid of pests (such as roaches and mice) and their droppings.  Throw away plants if you see mold on them.  Clean floors and dust surfaces regularly. Use unscented cleaning products.  Try to have someone else vacuum for you regularly. Stay out of rooms while they are being vacuumed and for a short while afterward. If you vacuum, use a dust mask from a hardware store, a double-layered or microfilter vacuum cleaner bag, or a vacuum cleaner with a HEPA filter.  Replace carpet with wood, tile, or vinyl flooring. Carpet can trap dander and dust.  Use allergy-proof pillows, mattress covers, and box spring covers.  Keep your bedroom a trigger-free room.  Avoid pets and keep windows closed when allergens are in the air.  Wash beddings  every week in hot water and dry them in a dryer.  Use blankets that are made of polyester or cotton.  Clean bathrooms and kitchens with bleach. If possible, have someone repaint the walls in these rooms with mold-resistant paint. Stay out of the rooms that are being cleaned and painted.  Wash your hands often with soap and water. If soap and water are not available, use hand sanitizer.  Do not allow anyone to smoke in your home. General instructions  Take over-the-counter and prescription medicines only as told by your health care provider. ? Speak with your health care provider if you have questions about how or when to take the medicines. ? Make note if you are requiring more frequent dosages.  Do not use any products that contain nicotine or tobacco, such as cigarettes and e-cigarettes. If you need help quitting, ask your health care provider. Also, avoid being exposed to secondhand smoke.  Use a peak flow meter as told by your health care provider. Record and keep track of the readings.  Understand and use the asthma action plan to help minimize, or stop an asthma attack, without needing to seek medical care.  Make sure  you stay up to date on your yearly vaccinations as told by your health care provider. This may include vaccines for the flu and pneumonia.  Avoid outdoor activities when allergen counts are high and when air quality is low.  Wear a ski mask that covers your nose and mouth during outdoor winter activities. Exercise indoors on cold days if you can.  Warm up before exercising, and take time for a cool-down period after exercise.  Keep all follow-up visits as told by your health care provider. This is important. Where to find more information  For information about asthma, turn to the Centers for Disease Control and Prevention at http://www.clark.net/.htm  For air quality information, turn to [employer] at [employer].nl Contact a health care provider if:  You have wheezing, shortness of breath, or a cough even while you are taking medicine to prevent attacks.  The mucus you cough up (sputum) is thicker than usual.  Your sputum changes from clear or white to yellow, green, gray, or bloody.  Your medicines are causing side effects, such as a rash, itching, swelling, or trouble breathing.  You need to use a reliever medicine more than 2-3 times a week.  Your peak flow reading is still at 50-79% of your personal best after following your action plan for 1 hour.  You have a fever. Get help right away if:  You are getting worse and do not respond to treatment during an asthma attack.  You are short of breath when at rest or when doing very little physical activity.  You have difficulty eating, drinking, or talking.  You have chest pain or tightness.  You develop a fast heartbeat or palpitations.  You have a bluish color to your lips or fingernails.  You are light-headed or dizzy, or you faint.  Your peak flow reading is less than 50% of your personal best.  You feel too tired to breathe normally. Summary  Asthma is a long-term (chronic) condition that causes recurrent  episodes in which the airways become tight and narrow. These episodes can cause coughing, wheezing, shortness of breath, and chest pain.  Asthma cannot be cured, but medicines and lifestyle changes can help control it and treat acute attacks.  Make sure you understand how to avoid triggers and how and when to use  your medicines.  Asthma attacks can range from minor to life threatening. Get help right away if you have an asthma attack and do not respond to treatment with your usual rescue medicines. This information is not intended to replace advice given to you by your health care provider. Make sure you discuss any questions you have with your health care provider. Document Revised: 04/11/2018 Document Reviewed: 03/13/2016 Elsevier Patient Education  [place].  Chronic Bronchitis, Adult Chronic bronchitis is long-lasting inflammation of the tubes that carry air into your lungs (bronchial tubes). This is inflammation that occurs:  On most days of the week.  For at least three months at a time.  Over a period of two years in a row. When the bronchial tubes are inflamed, they start to produce mucus. The inflammation and buildup of mucus make it more difficult to breathe. Chronic bronchitis is usually a permanent problem. It is one type of chronic obstructive pulmonary disease (COPD). People with chronic bronchitis are more likely to get frequent colds or respiratory infections. What are the causes? Chronic bronchitis most often occurs in people who:  Have chronic, severe asthma.  Have a history of smoking.  Have asthma and smoke.  Have certain lung diseases.  Have had long-term exposure to certain irritating fumes or chemicals. What are the signs or symptoms? Symptoms of chronic bronchitis may include:  A cough that brings up mucus (productive cough).  Shortness of breath.  Loud breathing (wheezing).  Chest discomfort.  Frequent (recurring) colds or respiratory  infections. Certain things can trigger chronic bronchitis symptoms or make them worse, such as:  Infections.  Stopping certain medicines.  Smoking.  Exposure to chemicals. How is this diagnosed? This condition may be diagnosed based on:  Your symptoms and medical history.  A physical exam.  A chest X-ray.  Lung (pulmonary) function tests. How is this treated? There is no cure for chronic bronchitis, but treatment can help control your symptoms. Treatment may include:  Using a cool mist vaporizer or humidifier to make it easier to breathe.  Drinking more fluids. Drinking more makes your mucus thinner, which may make it easier to breathe.  Lifestyle changes, such as eating a healthier diet and getting more exercise.  Medicines, such as: ? Inhalers to improve air flow in and out of your lungs. ? Antibiotics to treat any bacterial infections you have, such as:  Lung infection (pneumonia).  Sinus infection.  A sudden, severe (acute) episode of bronchitis.  Oxygen therapy.  Preventing infections by keeping up to date on vaccinations, including the pneumonia and flu vaccines.  Pulmonary rehabilitation. This is a program that helps you manage your breathing problems and improve your quality of life. It may last for up to 4-12 weeks and may include exercise programs, education, counseling, and treatment support. Follow these instructions at home: Medicines  Take over-the-counter and prescription medicines only as told by your health care provider.  If you were prescribed an antibiotic medicine, take it as told by your health care provider. Do not stop taking the antibiotic even if you start to feel better. Preventing infections  Get vaccinations as told by your health care provider. Make sure you get a flu shot (influenza vaccine) every year.  Wash your hands often with soap and water. If soap and water are not available, use hand sanitizer.  Avoid contact with people  who have symptoms of a cold or the flu. Managing symptoms   Do not smoke, and avoid secondhand  smoke. Exposure to cigarette smoke or irritating chemicals will make bronchitis worse. If you smoke and you need help quitting, ask your health care provider. Quitting smoking will help your lungs heal faster.  Use an inhaler, cool mist vaporizer, or humidifier as told by your health care provider.  Avoid pollen, dust, animal dander, molds, smoke, and other things that cause shortness of breath or wheezing attacks.  Use oxygen therapy at home as directed. Follow instructions from your health care provider about how to use oxygen safely and take precautions to prevent fire. Make sure you never smoke while using oxygen or allow others to smoke in your home.  Do not wait to get medical care if you have any concerning symptoms or trouble breathing. Waiting could cause permanent injury and may be life threatening. General instructions  Talk with your health care provider about what activities are safe for you and about possible exercise routines. Regular exercise is very important to help you feel better.  Drink enough fluids to keep your urine pale yellow.  Keep all follow-up visits as told by your health care provider. This is important. Contact a health care provider if:  You have coughing or shortness of breath that gets worse.  You have muscle aches.  You have chest pain.  Your mucus seems to get thicker.  Your mucus changes from clear or white to yellow, green, gray, or bloody. Get help right away if:  Your usual medicines do not stop your wheezing.  You have severe difficulty breathing. These symptoms may represent a serious problem that is an emergency. Do not wait to see if the symptoms will go away. Get medical help right away. Call your local emergency services (911 in the [place] Do not drive yourself to the hospital. Summary  Chronic bronchitis is long-lasting inflammation of  the tubes that carry air into your lungs (bronchial tubes).  Chronic bronchitis is usually a permanent problem. It is one type of chronic obstructive pulmonary disease (COPD).  There is no cure for chronic bronchitis, but treatment can help control your symptoms.  Do not smoke, and avoid secondhand smoke. Exposure to cigarette smoke or irritating chemicals will make bronchitis worse. This information is not intended to replace advice given to you by your health care provider. Make sure you discuss any questions you have with your health care provider. Document Revised: 11/29/2017 Document Reviewed: 12/27/2016 Elsevier Patient Education  [place].
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
## 2019-07-15 ENCOUNTER — Ambulatory Visit (INDEPENDENT_AMBULATORY_CARE_PROVIDER_SITE_OTHER): Payer: Medicare Other | Admitting: Dermatology

## 2019-07-15 DIAGNOSIS — D1722 Benign lipomatous neoplasm of skin and subcutaneous tissue of left arm: Secondary | ICD-10-CM

## 2019-07-15 DIAGNOSIS — D1721 Benign lipomatous neoplasm of skin and subcutaneous tissue of right arm: Secondary | ICD-10-CM | POA: Diagnosis not present

## 2019-07-15 NOTE — Telephone Encounter (Signed)
Faxed add on sheet to lab

## 2019-07-15 NOTE — Progress Notes (Signed)
   New Patient Visit  Subjective  Michael [name] is a 29 y.o. male who presents for the following: Growth (right forearm x 1 year, past 2 months has grown and causing numbness and tingling).  Lesion is tender to touch.   The following portions of the chart were reviewed this encounter and updated as appropriate:      Review of Systems:  No other skin or systemic complaints except as noted in HPI or Assessment and Plan.  Objective  Well appearing patient in no apparent distress; mood and affect are within normal limits.  A focused examination was performed including right forearm. Relevant physical exam findings are noted in the Assessment and Plan.  Objective  Left Upper Arm, L forearm: Rubbery nodules x 2  Objective  Right Forearm below elbow: 2.5 x 3.5cm rubbery nodule   Assessment & Plan  Lipoma of left upper extremity Left Upper Arm, L forearm  Asymptomatic.  Benign, observe.    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  Discussed surgery including resulting scar and possible recurrence, will schedule.  Pre-op form given today.  Return for surgery, lipoma of the right forearm below elbow.  IJamesetta Orleans, CMA, am acting as scribe for Brendolyn Patty, MD .  Documentation: I have reviewed the above documentation for accuracy and completeness, and I agree with the above.  Brendolyn Patty MD

## 2019-07-15 NOTE — Patient Instructions (Signed)
Pre-Operative Instructions  You are scheduled for a surgical procedure at [facility]. We recommend you read the following instructions. If you have any questions or concerns, please call the office at [phone].  1. Shower and wash the entire body with soap and water the day of your surgery paying special attention to cleansing at and around the planned surgery site.  2. Avoid aspirin or aspirin containing products at least fourteen (14) days prior to your surgical procedure and for at least one week (7 Days) after your surgical procedure. If you take aspirin on a regular basis for heart disease or history of stroke or for any other reason, we may recommend you continue taking aspirin but please notify us if you take this on a regular basis. Aspirin can cause more bleeding to occur during surgery as well as prolonged bleeding and bruising after surgery.   3. Avoid other nonsteroidal pain medications at least one week prior to surgery and at least one week prior to your surgery. These include medications such as Ibuprofen (Motrin, Advil and Nuprin), Naprosyn, Voltaren, Relafen, etc. If medications are used for therapeutic reasons, please inform us as they can cause increased bleeding or prolonged bleeding during and bruising after surgical procedures.   4. Please advice us if you are taking any "blood thinner" medications such as Coumadin or Dipyridamole or Plavix or similar medications. These cause increased bleeding and prolonged bleeding during and bruising after surgical procedures. We may have to consider discontinuing these medications briefly prior to and shortly after your surgery, if safe to do so.   5. Please inform us of all medications you are currently taking. All medications that are taken regularly should be taken the day of surgery as you always do. Nevertheless, we need to be informed of what medications you are taking prior to surgery to whether they will affect the  procedure or cause any complications.   6. Please inform us of any medication allergies. Also inform us of whether you have allergies to Latex or rubber products or whether you have had any adverse reaction to Lidocaine or Epinephrine.  7. Please inform us of any prosthetic or artificial body parts such as artificial heart valve, joint replacements, etc., or similar condition that might require preoperative antibiotics.   8. We recommend avoidance of alcohol at least two weeks prior to surgery and continued avoidence for at least two weeks after surgery.   9. We recommend discontinuation of tobacco smoking at least two weeks prior to surgery and continued abstinence for at least two weeks after surgery.  10. Do not plan strenuous exercise, strenuous work or strenuous lifting for approximately four weeks after your surgery.   11. We request if you are unable to make your scheduled surgical appointment, please call us at least a week in advance or as soon as you are aware of a problem sot aht we can cancel or reschedule you.   12. You MAKE TAKE TYLENOL (acetaminophen) for pain as it is not a blood thinner.   13. PLEASE PLAN TO BE IN TOWN FOR TWO WEEKS FOLLOWING SURGERY, THIS IS IMPORTANT SO YOU CAN BE CHECKED FOR DRESSING CHANGES, SUTURE REMOVAL AND TO MONITOR FOR POSSIBLE COMPLICATIONS.  14.  

## 2019-07-23 ENCOUNTER — Encounter: Payer: Self-pay | Admitting: Family Medicine

## 2019-07-23 ENCOUNTER — Ambulatory Visit
Admission: RE | Admit: 2019-07-23 | Discharge: 2019-07-23 | Disposition: A | Discharge status: Home / Self Care | Payer: Medicare Other | Source: Ambulatory Visit | Attending: Family Medicine | Admitting: Family Medicine

## 2019-07-23 ENCOUNTER — Ambulatory Visit (INDEPENDENT_AMBULATORY_CARE_PROVIDER_SITE_OTHER): Payer: Medicare Other | Admitting: Family Medicine

## 2019-07-23 ENCOUNTER — Other Ambulatory Visit: Payer: Self-pay

## 2019-07-23 ENCOUNTER — Other Ambulatory Visit
Admission: RE | Admit: 2019-07-23 | Discharge: 2019-07-23 | Disposition: A | Discharge status: Home / Self Care | Payer: Medicare Other | Source: Ambulatory Visit | Attending: Family Medicine | Admitting: Family Medicine

## 2019-07-23 VITALS — BP 130/90 | HR 106 | Temp 97.9°F | Ht 67.0 in | Wt 205.4 lb

## 2019-07-23 DIAGNOSIS — R61 Generalized hyperhidrosis: Secondary | ICD-10-CM | POA: Diagnosis not present

## 2019-07-23 DIAGNOSIS — R053 Chronic cough: Secondary | ICD-10-CM

## 2019-07-23 DIAGNOSIS — R05 Cough: Secondary | ICD-10-CM | POA: Diagnosis not present

## 2019-07-23 DIAGNOSIS — M2550 Pain in unspecified joint: Secondary | ICD-10-CM | POA: Diagnosis not present

## 2019-07-23 DIAGNOSIS — D7219 Other eosinophilia: Secondary | ICD-10-CM

## 2019-07-23 DIAGNOSIS — R5382 Chronic fatigue, unspecified: Secondary | ICD-10-CM

## 2019-07-23 DIAGNOSIS — R7989 Other specified abnormal findings of blood chemistry: Secondary | ICD-10-CM | POA: Diagnosis not present

## 2019-07-23 DIAGNOSIS — R0789 Other chest pain: Secondary | ICD-10-CM

## 2019-07-23 DIAGNOSIS — R059 Cough, unspecified: Secondary | ICD-10-CM

## 2019-07-23 DIAGNOSIS — R079 Chest pain, unspecified: Secondary | ICD-10-CM

## 2019-07-23 DIAGNOSIS — L723 Sebaceous cyst: Secondary | ICD-10-CM | POA: Diagnosis not present

## 2019-07-23 DIAGNOSIS — D179 Benign lipomatous neoplasm, unspecified: Secondary | ICD-10-CM | POA: Diagnosis not present

## 2019-07-23 LAB — CBC WITH DIFFERENTIAL/PLATELET
Abs Immature Granulocytes: 0.04 10*3/uL (ref 0.00–0.07)
Basophils Absolute: 0.1 10*3/uL (ref 0.0–0.1)
Basophils Relative: 1 %
Eosinophils Absolute: 0.9 10*3/uL — ABNORMAL HIGH (ref 0.0–0.5)
Eosinophils Relative: 8 %
HCT: 45.5 % (ref 39.0–52.0)
Hemoglobin: 15.9 g/dL (ref 13.0–17.0)
Immature Granulocytes: 0 %
Lymphocytes Relative: 29 %
Lymphs Abs: 3.2 10*3/uL (ref 0.7–4.0)
MCH: 31.7 pg (ref 26.0–34.0)
MCHC: 34.9 g/dL (ref 30.0–36.0)
MCV: 90.6 fL (ref 80.0–100.0)
Monocytes Absolute: 0.8 10*3/uL (ref 0.1–1.0)
Monocytes Relative: 7 %
Neutro Abs: 6 10*3/uL (ref 1.7–7.7)
Neutrophils Relative %: 55 %
Platelets: 244 10*3/uL (ref 150–400)
RBC: 5.02 MIL/uL (ref 4.22–5.81)
RDW: 12.7 % (ref 11.5–15.5)
WBC: 11.1 10*3/uL — ABNORMAL HIGH (ref 4.0–10.5)
nRBC: 0 % (ref 0.0–0.2)

## 2019-07-23 LAB — FIBRIN DERIVATIVES D-DIMER (ARMC ONLY): Fibrin derivatives D-dimer (ARMC): 185.74 ng/mL (FEU) (ref 0.00–499.00)

## 2019-07-23 LAB — T4, FREE: Free T4: 0.84 ng/dL (ref 0.61–1.12)

## 2019-07-23 LAB — TSH: TSH: 2.237 u[IU]/mL (ref 0.350–4.500)

## 2019-07-23 LAB — SEDIMENTATION RATE: Sed Rate: 4 mm/hr (ref 0–15)

## 2019-07-23 NOTE — Assessment & Plan Note (Signed)
Has been going on for several weeks intermittently.  Seems consistent with costochondritis though given his other symptoms that have been going on chronically we will check a D-dimer to rule out PE.  I have a sufficiently low pretest probability for PE that I think a negative D-dimer would rule this out as a cause.

## 2019-07-23 NOTE — Assessment & Plan Note (Signed)
I think this represents an inflamed sebaceous cyst.  Will refer to general surgery to consider removal.

## 2019-07-23 NOTE — Assessment & Plan Note (Addendum)
Chronic ongoing issue.  He has had some chills and night sweats for several months.  Additionally has had sharp joint pains as well.  The differential is very wide.  He could have some autoimmune or rheumatologic disorder.  TB is on the differential given his symptoms.  He had an abnormal TSH recently and that could be contributing as well.  Will obtain lab work as outlined below and then determine the next step in management.

## 2019-07-23 NOTE — Patient Instructions (Addendum)
Nice to see you.  We will get labs at the hospital. Please get a chest x-ray as well.  If you develop persistent chest pain or shortness of breath please seek medical attention.

## 2019-07-23 NOTE — Assessment & Plan Note (Signed)
Chronic issue likely related to his asthma and continued smoking.  Discussed smoking cessation with nicotine patches and gum or lozenges.  Discussed the potential for an inhaler for his asthma.  Will await his lab results and chest x-ray and then consider prescribing an inhaler and will discuss with our clinical pharmacist given his allergy to prednisone.

## 2019-07-23 NOTE — Assessment & Plan Note (Signed)
I suspect the lipoma on his right forearm is contributing to the tingling he gets in his fourth and fifth fingers.  He is in the process of being scheduled for surgical removal of this by dermatology.

## 2019-07-23 NOTE — Progress Notes (Signed)
Michael Rumps, MD Phone: [phone]  Michael [name] is a 29 y.o. male who presents today for f/u.  Chronic fatigue, joint pains, night sweats: Patient notes sharp joint pains that have been going on for at least 3 weeks.  Describes it as a pulsating pain that occurs intermittently.  Occurs in his bilateral elbows, neck, bilateral knees, thighs, and chest joints.  Notes occasionally in his right chest.  Occasionally it is directly in the middle.  No diaphoresis.  Lasts for about a minute and then goes away.  He notes no fevers though has had chills for about 6 months intermittently.  He also notes some night sweats for a couple of months.  No history of IV drug abuse.  He does use marijuana and has used cocaine >5 months ago and smokes cigarettes.  He spent about a week in jail a year ago.  He has not been in prison.  Reports feeling as though he has been going pale at times and notes his lips would occasionally turn white.  Asthma: Patient notes chronic cough and congestion for at least a year.  Coughing up yellow/white mucus.  Occasionally there is tint of redness in his mucus.  He has not had a chest x-ray.  Boil: Patient notes on his posterior neck.  It still painful.  He has 1 dose of doxycycline left.  Lipoma: He has a lipoma on his right forearm near his ulnar groove.  He notes occasional tingling and numbness in his right fourth and fifth fingers.  Social History   Tobacco Use  Smoking Status Current Every Day Smoker  . Packs/day: 1.50  . Types: Cigarettes  Smokeless Tobacco Former Systems developer  . Types: Chew  . Quit date: 08/12/2017     ROS see history of present illness  Objective  Physical Exam Vitals:   07/23/19 0854  BP: 130/90  Pulse: (!) 106  Temp: 97.9 F (36.6 C)  SpO2: 96%    BP Readings from Last 3 Encounters:  07/23/19 130/90  07/14/19 (!) 138/100  03/04/18 118/78   Wt Readings from Last 3 Encounters:  07/23/19 205 lb 6.4 oz (93.2 kg)  07/14/19 204 lb  12.8 oz (92.9 kg)  03/04/18 201 lb 3.2 oz (91.3 kg)    Physical Exam Constitutional:      General: He is not in acute distress.    Appearance: He is not diaphoretic.  Cardiovascular:     Rate and Rhythm: Normal rate and regular rhythm.     Heart sounds: Normal heart sounds.  Pulmonary:     Effort: Pulmonary effort is normal.     Breath sounds: Normal breath sounds.  Musculoskeletal:     Comments: Muscles in his upper arms and upper legs are tender to palpation, elbows and knees are tender to palpation  Skin:    General: Skin is warm and dry.  Neurological:     Mental Status: He is alert.     Comments: 4+/5 strength bilateral biceps, triceps, and grip, sensation to light touch intact bilateral upper extremities      Assessment/Plan: Please see individual problem list.  Chronic fatigue Chronic ongoing issue.  He has had some chills and night sweats for several months.  Additionally has had sharp joint pains as well.  The differential is very wide.  He could have some autoimmune or rheumatologic disorder.  TB is on the differential given his symptoms.  He had an abnormal TSH recently and that could be contributing as well.  Will obtain lab work as outlined below and then determine the next step in management.  Cough Chronic issue likely related to his asthma and continued smoking.  Discussed smoking cessation with nicotine patches and gum or lozenges.  Discussed the potential for an inhaler for his asthma.  Will await his lab results and chest x-ray and then consider prescribing an inhaler and will discuss with our clinical pharmacist given his allergy to prednisone.  Chest pain Has been going on for several weeks intermittently.  Seems consistent with costochondritis though given his other symptoms that have been going on chronically we will check a D-dimer to rule out PE.  I have a sufficiently low pretest probability for PE that I think a negative D-dimer would rule this out as a  cause.  Inflamed sebaceous cyst I think this represents an inflamed sebaceous cyst.  Will refer to general surgery to consider removal.  Multiple lipomas I suspect the lipoma on his right forearm is contributing to the tingling he gets in his fourth and fifth fingers.  He is in the process of being scheduled for surgical removal of this by dermatology.   Given that we are trying to rule out TB as a cause Michael [name], CMA contacted the lab and radiology at the hospital to inform them of this and that the patient was coming to have labs and an x-ray.   Orders Placed This Encounter  Procedures  . DG Chest 2 View    Standing Status:   Future    Standing Expiration Date:   07/22/2020    Order Specific Question:   Reason for Exam (SYMPTOM  OR DIAGNOSIS REQUIRED)    Answer:   chronic cough, intermittent blood tinged mucus, history of asthma    Order Specific Question:   Preferred imaging location?    Answer:   [facility]    Order Specific Question:   Radiology Contrast Protocol - do NOT remove file path    Answer:   [email or web address]  . CBC with Differential/Platelet    Standing Status:   Future    Standing Expiration Date:   07/22/2020  . Sedimentation rate    Standing Status:   Future    Standing Expiration Date:   07/22/2020  . TSH    Standing Status:   Future    Standing Expiration Date:   07/22/2020  . T4, free    Standing Status:   Future    Standing Expiration Date:   07/22/2020  . T3, free    Standing Status:   Future    Standing Expiration Date:   07/22/2020  . Rheumatoid Factor    Standing Status:   Future    Standing Expiration Date:   07/22/2020  . Antinuclear Antib (ANA)    Standing Status:   Future    Standing Expiration Date:   07/22/2020  . CYCLIC CITRUL PEPTIDE ANTIBODY, IGG/IGA    Standing Status:   Future    Standing Expiration Date:   07/22/2020  . Fibrin derivatives D-Dimer ([facility] only)    Standing Status:   Future     Standing Expiration Date:   07/22/2020  . QuantiFERON-TB Gold Plus    Standing Status:   Future    Standing Expiration Date:   07/22/2020  . Ambulatory referral to General Surgery    Referral Priority:   Routine    Referral Type:   Surgical    Referral Reason:   Specialty Services Required    Requested Specialty:  General Surgery    Number of Visits Requested:   1    No orders of the defined types were placed in this encounter.   This visit occurred during the SARS-CoV-2 public health emergency.  Safety protocols were in place, including screening questions prior to the visit, additional usage of staff PPE, and extensive cleaning of exam room while observing appropriate contact time as indicated for disinfecting solutions.    I have spent 45 minutes in the care of this patient regarding history taking, exam, documentation, discussion of plan, and placing orders.   Michael Rumps, MD [place]

## 2019-07-24 LAB — ANA: Anti Nuclear Antibody (ANA): NEGATIVE

## 2019-07-24 LAB — T3, FREE: T3, Free: 3.4 pg/mL (ref 2.0–4.4)

## 2019-07-24 LAB — RHEUMATOID FACTOR: Rheumatoid fact SerPl-aCnc: <10 IU/mL (ref 0.0–13.9)

## 2019-07-25 LAB — QUANTIFERON-TB GOLD PLUS: QuantiFERON-TB Gold Plus: NEGATIVE

## 2019-07-25 LAB — QUANTIFERON-TB GOLD PLUS (RQFGPL)
QuantiFERON Mitogen Value: >10 IU/mL
QuantiFERON Nil Value: 0 IU/mL
QuantiFERON TB1 Ag Value: 0 IU/mL
QuantiFERON TB2 Ag Value: 0 IU/mL

## 2019-07-25 LAB — CYCLIC CITRUL PEPTIDE ANTIBODY, IGG/IGA: CCP Antibodies IgG/IgA: 2 units (ref 0–19)

## 2019-07-29 ENCOUNTER — Other Ambulatory Visit: Payer: Self-pay | Admitting: Family Medicine

## 2019-07-29 DIAGNOSIS — M2550 Pain in unspecified joint: Secondary | ICD-10-CM

## 2019-07-30 ENCOUNTER — Telehealth: Payer: Self-pay | Admitting: Family Medicine

## 2019-07-30 DIAGNOSIS — J45909 Unspecified asthma, uncomplicated: Secondary | ICD-10-CM

## 2019-07-30 DIAGNOSIS — Z8709 Personal history of other diseases of the respiratory system: Secondary | ICD-10-CM

## 2019-07-30 NOTE — Telephone Encounter (Signed)
I apologize for changing course again. I would actually like for him to see pulmonology for evaluation to determine appropriate medication management. Singulair has a risk of causing psychiatric events and with his prior history I would prefer to have a pulmonologist weigh in on his medications. I will order the PFTs to be completed as well.

## 2019-07-30 NOTE — Telephone Encounter (Signed)
-----   Message from De Hollingshead, [facility] sent at 07/29/2019  1:04 PM EDT ----- Has he been using albuterol HFA that Kim prescribed? If so, how often?   I do worry about using an inhaled corticosteroid, given the reported issue w/ prednisone. I'd want to know more information about exactly what happened when he used prednisone in the past. Would also consider getting spirometry to better characterize lung disease. Maybe referral to pulmonary, just to be on the safe side?  Could always consider starting montelukast instead.   Catie ----- Message ----- From: [name], MD Sent: 07/29/2019  12:44 PM EDT To: De Hollingshead, [facility]  Rheumatology referral placed.   Catie, this patient has asthma and has an allergy to prednisone that he noted was difficulty breathing. I was going to prescribe him symbicort for his asthma, though it flagged given his reaction to prednisone. What do you think of using a steroid inhaler with his reaction to prednisone? Should I go down another route for asthma treatment?

## 2019-07-30 NOTE — Telephone Encounter (Signed)
Please let the patient know that I checked with our clinical pharmacist regarding the inhaler given his prior reaction to prednisone.  I would like to instead start him on a pill called Singulair to see if that would help with his symptoms.  I would also like to get pulmonary function testing to characterize his breathing issues.  I can order these things once you speak with him.  Thanks.

## 2019-07-30 NOTE — Telephone Encounter (Signed)
Referral placed.

## 2019-07-30 NOTE — Telephone Encounter (Signed)
I called and explained to the patient that the provider decided to change course and have him see a pulmonologist instead and he understood.  Zarie Kosiba,cma

## 2019-07-30 NOTE — Addendum Note (Signed)
Addended by: [name] on: 07/30/2019 03:24 PM   Modules accepted: Orders

## 2019-07-30 NOTE — Telephone Encounter (Signed)
I called and spoke with the patient and informed him that the provider stated that due to his reaction to prednisone he wants to start him on a pill (Singulair) and he wants to order pulmonary function testing.  Patient is okay with the pills and the pulmonary function test.  Michael [name],cma

## 2019-07-30 NOTE — Addendum Note (Signed)
Addended by: [name] on: 07/30/2019 04:17 PM   Modules accepted: Orders

## 2019-07-31 ENCOUNTER — Other Ambulatory Visit: Payer: Self-pay | Admitting: General Surgery

## 2019-07-31 DIAGNOSIS — L723 Sebaceous cyst: Secondary | ICD-10-CM | POA: Diagnosis not present

## 2019-08-04 LAB — SURGICAL PATHOLOGY

## 2019-08-21 NOTE — Progress Notes (Signed)
I called and spoke with the patient and scheduled a follow up with the provider in 1 month.  Younes Degeorge,cma

## 2019-09-03 ENCOUNTER — Other Ambulatory Visit: Payer: Self-pay

## 2019-09-03 ENCOUNTER — Ambulatory Visit (INDEPENDENT_AMBULATORY_CARE_PROVIDER_SITE_OTHER): Payer: Medicare Other | Admitting: Dermatology

## 2019-09-03 DIAGNOSIS — D485 Neoplasm of uncertain behavior of skin: Secondary | ICD-10-CM | POA: Diagnosis not present

## 2019-09-03 DIAGNOSIS — D1721 Benign lipomatous neoplasm of skin and subcutaneous tissue of right arm: Secondary | ICD-10-CM | POA: Diagnosis not present

## 2019-09-03 NOTE — Patient Instructions (Signed)

## 2019-09-03 NOTE — Progress Notes (Signed)
   Follow-Up Visit   Subjective  Michael [name] is a 29 y.o. male who presents for the following: Procedure.  Patient here today for excision of symptomatic lipoma at the right elbow.   The following portions of the chart were reviewed this encounter and updated as appropriate:      Review of Systems:  No other skin or systemic complaints except as noted in HPI or Assessment and Plan.  Objective  Well appearing patient in no apparent distress; mood and affect are within normal limits.  A focused examination was performed including right elbow. Relevant physical exam findings are noted in the Assessment and Plan.  Objective  Right Forearm below Elbow: 2.5 x 3.5 rubbery nodule   Assessment & Plan  Neoplasm of uncertain behavior of skin Right Forearm below Elbow  Skin excision  Lesion length (cm):  2.5 Lesion width (cm):  3.5 Margin per side (cm):  0 Total excision diameter (cm):  3.5 Informed consent: discussed and consent obtained   Timeout: patient name, date of birth, surgical site, and procedure verified   Procedure prep:  Patient was prepped and draped in usual sterile fashion Prep type:  Povidone-iodine Anesthesia: the lesion was anesthetized in a standard fashion   Anesthetic:  1% lidocaine w/ epinephrine 1-100,000 buffered w/ 8.4% NaHCO3 (16cc) Instrument used comment:  15c Hemostasis achieved with: pressure   Outcome: patient tolerated procedure well with no complications    Skin repair Complexity:  Intermediate Final length (cm):  1.7 Reason for type of repair: reduce tension to allow closure, reduce the risk of dehiscence, infection, and necrosis, reduce subcutaneous dead space and avoid a hematoma and enhance both functionality and cosmetic results   Undermining: edges undermined   Subcutaneous layers (deep stitches):  Suture size:  4-0 Suture type: Vicryl (polyglactin 910)   Stitches:  Buried vertical mattress Fine/surface layer approximation (top  stitches):  Suture size:  4-0 Suture type: nylon   Stitches: simple interrupted   Suture removal (days):  7 Hemostasis achieved with: suture and pressure Outcome: patient tolerated procedure well with no complications   Post-procedure details: sterile dressing applied and wound care instructions given   Dressing type: pressure dressing   Additional details:  Mupirocin and a pressure dressing applied  Specimen 1 - Surgical pathology Differential Diagnosis: Lipoma vs Angiolipoma Check Margins: No 2.5 x 3.5 rubbery nodule  Return in about 1 week (around 09/10/2019) for Suture Removal.   Documentation: I have reviewed the above documentation for accuracy and completeness, and I agree with the above.  Brendolyn Patty MD

## 2019-09-04 ENCOUNTER — Telehealth: Payer: Self-pay

## 2019-09-04 NOTE — Telephone Encounter (Signed)
-----   Message from Graciella Belton, [place] sent at 09/03/2019  2:53 PM EDT ----- Regarding: Surgery Calls

## 2019-09-04 NOTE — Telephone Encounter (Signed)
Patient doing well after yesterdays surgery, JS 

## 2019-09-08 ENCOUNTER — Encounter: Payer: Self-pay | Admitting: Pulmonary Disease

## 2019-09-08 ENCOUNTER — Ambulatory Visit: Payer: Medicare Other | Admitting: Pulmonary Disease

## 2019-09-08 ENCOUNTER — Ambulatory Visit
Admission: RE | Admit: 2019-09-08 | Discharge: 2019-09-08 | Disposition: A | Discharge status: Home / Self Care | Payer: Medicare Other | Source: Ambulatory Visit | Attending: Pulmonary Disease | Admitting: Pulmonary Disease

## 2019-09-08 ENCOUNTER — Other Ambulatory Visit: Payer: Self-pay

## 2019-09-08 VITALS — BP 130/88 | HR 75 | Temp 98.0°F | Ht 67.0 in | Wt 206.4 lb

## 2019-09-08 DIAGNOSIS — R0602 Shortness of breath: Secondary | ICD-10-CM

## 2019-09-08 DIAGNOSIS — F17210 Nicotine dependence, cigarettes, uncomplicated: Secondary | ICD-10-CM

## 2019-09-08 DIAGNOSIS — J449 Chronic obstructive pulmonary disease, unspecified: Secondary | ICD-10-CM

## 2019-09-08 MED ORDER — TRELEGY ELLIPTA 100-62.5-25 MCG/INH IN AEPB: 1.0000 | INHALATION_SPRAY | Freq: Every day | RESPIRATORY_TRACT | 0 refills | Status: AC

## 2019-09-08 MED ORDER — OMEPRAZOLE 40 MG PO CPDR: 40.0000 mg | DELAYED_RELEASE_CAPSULE | Freq: Every day | ORAL | 2 refills | Status: DC

## 2019-09-08 MED ORDER — TRELEGY ELLIPTA 100-62.5-25 MCG/INH IN AEPB: 1.0000 | INHALATION_SPRAY | Freq: Every day | RESPIRATORY_TRACT | 6 refills | Status: DC

## 2019-09-08 NOTE — Patient Instructions (Addendum)
We are going to get a chest x-ray and breathing test.  We are also going to give you a trial of Trelegy Ellipta 1 inhalation daily.  Make sure you rinse your mouth well after you use it. Putting a little bit of baking soda (sodium bicarbonate) in the rinse water helps.  We also started you on a medication for your reflux.  This can also affect your breathing .  We will see him in follow-up in 4 to 6 weeks time call sooner should any new difficulties arise.

## 2019-09-08 NOTE — Progress Notes (Signed)
    Assessment & Plan:  1. Shortness of breath (Primary) - DG Chest 2 View; Future - Pulmonary Function Test [facility] Only; Future  2. Asthma-COPD overlap syndrome (HCC)  3. Tobacco dependence due to cigarettes   Patient Instructions  We are going to get a chest x-ray and breathing test.  We are also going to give you a trial of Trelegy Ellipta  1 inhalation daily.  Make sure you rinse your mouth well after you use it. Putting a little bit of baking soda (sodium bicarbonate) in the rinse water  helps.  We also started you on a medication for your reflux.  This can also affect your breathing .  We will see him in follow-up in 4 to 6 weeks time call sooner should any new difficulties arise.    Please note: late entry documentation due to logistical difficulties during COVID-19 pandemic. This note is filed for information purposes only, and is not intended to be used for billing, nor does it represent the full scope/nature of the visit in question. Please see any associated scanned media linked to date of encounter for additional pertinent information.  Subjective:    HPI: Michael [name] is a 29 y.o. male presenting to the pulmonology clinic on 09/08/2019 with report of: pulmonary consult (per Dr. Ruthine of asthma. c/o wheezing, sob with exertion, prod cough with clear mucus, chest discomfort on right side and chest congestion mainly at night)     Outpatient Encounter Medications as of 09/08/2019  Medication Sig   famotidine -calcium  carbonate-magnesium hydroxide (PEPCID  COMPLETE) 10-800-165 MG chewable tablet Chew 1 tablet by mouth daily as needed (heartburn or indigestion).   [EXPIRED] Fluticasone-Umeclidin-Vilant (TRELEGY ELLIPTA ) 100-62.5-25 MCG/INH AEPB Inhale 1 puff into the lungs daily for 1 day.   [DISCONTINUED] albuterol  (VENTOLIN  HFA) 108 (90 Base) MCG/ACT inhaler Inhale 1-2 puffs into the lungs every 6 (six) hours as needed for wheezing or shortness of breath.    [DISCONTINUED] doxycycline  (VIBRA -TABS) 100 MG tablet Take 1 tablet (100 mg total) by mouth 2 (two) times daily.   [DISCONTINUED] Fluticasone-Umeclidin-Vilant (TRELEGY ELLIPTA ) 100-62.5-25 MCG/INH AEPB Inhale 1 puff into the lungs daily.   [DISCONTINUED] omeprazole  (PRILOSEC) 40 MG capsule Take 1 capsule (40 mg total) by mouth daily.   [DISCONTINUED] Skin Protectants, Misc. (CERAVE) OINT Apply 1 application topically daily as needed (itching).   No facility-administered encounter medications on file as of 09/08/2019.      Objective:   Vitals:   09/08/19 1106  BP: 130/88  Pulse: 75  Temp: 98 F (36.7 C)  Height: 5' 7 (1.702 m)  Weight: 206 lb 6.4 oz (93.6 kg)  SpO2: 96%  TempSrc: Temporal  BMI (Calculated): 32.32     Physical exam documentation is limited by delayed entry of information.

## 2019-09-10 ENCOUNTER — Other Ambulatory Visit: Payer: Self-pay

## 2019-09-10 ENCOUNTER — Encounter: Payer: Self-pay | Admitting: Dermatology

## 2019-09-10 ENCOUNTER — Ambulatory Visit (INDEPENDENT_AMBULATORY_CARE_PROVIDER_SITE_OTHER): Payer: Medicare Other | Admitting: Dermatology

## 2019-09-10 DIAGNOSIS — Z4802 Encounter for removal of sutures: Secondary | ICD-10-CM

## 2019-09-10 NOTE — Progress Notes (Signed)
   Follow-Up Visit   Subjective  Michael [name] is a 28 y.o. male who presents for the following: Follow-up (Surgery).  Patient present today for suture removal from surgery on 09/03/19 on Right elbow. Pathology showed Angiolipoma. No problems, no pain or bleeding.  The following portions of the chart were reviewed this encounter and updated as appropriate:      Review of Systems:  No other skin or systemic complaints except as noted in HPI or Assessment and Plan.  Objective  Well appearing patient in no apparent distress; mood and affect are within normal limits.  A focused examination was performed including Right Elbow. Relevant physical exam findings are noted in the Assessment and Plan.  Objective  Right Elbow: Incision site is clean, dry and intact    Assessment & Plan  Encounter for removal of sutures Right Elbow  Pathology results discussed- Angiolipoma  Sutures removed and steri strips applied  Return if symptoms worsen or fail to improve.  Marene Lenz, CMA, am acting as scribe for Brendolyn Patty, MD .  Documentation: I have reviewed the above documentation for accuracy and completeness, and I agree with the above.  Brendolyn Patty MD

## 2019-09-23 ENCOUNTER — Ambulatory Visit: Payer: Medicare Other | Admitting: Family Medicine

## 2019-09-29 ENCOUNTER — Other Ambulatory Visit: Payer: Self-pay | Admitting: Internal Medicine

## 2019-10-02 ENCOUNTER — Other Ambulatory Visit: Payer: Medicare Other

## 2019-10-03 ENCOUNTER — Ambulatory Visit: Payer: Medicare Other

## 2019-10-10 ENCOUNTER — Telehealth: Payer: Self-pay

## 2019-10-10 NOTE — Telephone Encounter (Signed)
Lm to relay date/time of covid test prior to PFT.  [facility]/13/[facility]

## 2019-10-13 ENCOUNTER — Other Ambulatory Visit
Admission: RE | Admit: 2019-10-13 | Discharge: 2019-10-13 | Disposition: A | Discharge status: Home / Self Care | Payer: Medicare Other | Source: Ambulatory Visit | Attending: Pulmonary Disease | Admitting: Pulmonary Disease

## 2019-10-13 ENCOUNTER — Other Ambulatory Visit: Payer: Self-pay

## 2019-10-13 DIAGNOSIS — Z01812 Encounter for preprocedural laboratory examination: Secondary | ICD-10-CM | POA: Insufficient documentation

## 2019-10-13 DIAGNOSIS — Z20822 Contact with and (suspected) exposure to covid-19: Secondary | ICD-10-CM | POA: Diagnosis not present

## 2019-10-13 LAB — SARS CORONAVIRUS 2 (TAT 6-24 HRS): SARS Coronavirus 2: NEGATIVE

## 2019-10-13 NOTE — Telephone Encounter (Signed)
Patient is aware of date/time of covid test.  Nothing further is needed.

## 2019-10-14 ENCOUNTER — Ambulatory Visit: Payer: Medicare Other | Attending: Pulmonary Disease

## 2019-10-14 DIAGNOSIS — R0602 Shortness of breath: Secondary | ICD-10-CM | POA: Diagnosis not present

## 2019-10-14 MED ORDER — ALBUTEROL SULFATE (2.5 MG/3ML) 0.083% IN NEBU
2.5000 mg | INHALATION_SOLUTION | Freq: Once | RESPIRATORY_TRACT | Status: AC
  Administered 2019-10-14: 2.5 mg via RESPIRATORY_TRACT
  Filled 2019-10-14: qty 3

## 2019-10-15 ENCOUNTER — Ambulatory Visit: Payer: Medicare Other | Admitting: Pulmonary Disease

## 2019-10-15 ENCOUNTER — Encounter: Payer: Self-pay | Admitting: Pulmonary Disease

## 2019-10-15 ENCOUNTER — Other Ambulatory Visit: Payer: Self-pay

## 2019-10-15 VITALS — BP 128/80 | HR 91 | Temp 98.0°F | Ht 67.0 in | Wt 201.6 lb

## 2019-10-15 DIAGNOSIS — R0602 Shortness of breath: Secondary | ICD-10-CM

## 2019-10-15 DIAGNOSIS — R05 Cough: Secondary | ICD-10-CM

## 2019-10-15 DIAGNOSIS — K219 Gastro-esophageal reflux disease without esophagitis: Secondary | ICD-10-CM

## 2019-10-15 DIAGNOSIS — R059 Cough, unspecified: Secondary | ICD-10-CM

## 2019-10-15 DIAGNOSIS — J449 Chronic obstructive pulmonary disease, unspecified: Secondary | ICD-10-CM

## 2019-10-15 DIAGNOSIS — F17210 Nicotine dependence, cigarettes, uncomplicated: Secondary | ICD-10-CM

## 2019-10-15 LAB — PULMONARY FUNCTION TEST ARMC ONLY
DL/VA % pred: 76 %
DL/VA: 3.85 ml/min/mmHg/L
DLCO unc % pred: 86 %
DLCO unc: 25.86 ml/min/mmHg
FEF 25-75 Post: 1.72 L/sec
FEF 25-75 Pre: 3.74 L/sec
FEF2575-%Change-Post: -54 %
FEF2575-%Pred-Post: 39 %
FEF2575-%Pred-Pre: 87 %
FEV1-%Change-Post: -19 %
FEV1-%Pred-Post: 78 %
FEV1-%Pred-Pre: 98 %
FEV1-Post: 3.25 L
FEV1-Pre: 4.06 L
FEV1FVC-%Change-Post: 0 %
FEV1FVC-%Pred-Pre: 95 %
FEV6-%Change-Post: -20 %
FEV6-%Pred-Post: 82 %
FEV6-%Pred-Pre: 103 %
FEV6-Post: 4.1 L
FEV6-Pre: 5.16 L
FEV6FVC-%Pred-Post: 100 %
FEV6FVC-%Pred-Pre: 100 %
FVC-%Change-Post: -20 %
FVC-%Pred-Post: 81 %
FVC-%Pred-Pre: 102 %
FVC-Post: 4.1 L
FVC-Pre: 5.16 L
Post FEV1/FVC ratio: 79 %
Post FEV6/FVC ratio: 100 %
Pre FEV1/FVC ratio: 79 %
Pre FEV6/FVC Ratio: 100 %
RV % pred: 164 %
RV: 2.33 L
TLC % pred: 97 %
TLC: 6.11 L

## 2019-10-15 MED ORDER — TRELEGY ELLIPTA 200-62.5-25 MCG/INH IN AEPB: 1.0000 | INHALATION_SPRAY | Freq: Every day | RESPIRATORY_TRACT | 11 refills | Status: DC

## 2019-10-15 NOTE — Progress Notes (Signed)
    Assessment & Plan:  1. Shortness of breath (Primary) - ECHOCARDIOGRAM COMPLETE; Future  2. Asthma-COPD overlap syndrome (HCC)  3. Cough  4. Gastroesophageal reflux disease, unspecified whether esophagitis present  5. Tobacco dependence due to cigarettes   Patient Instructions  What we discussed today:  Take your omeprazole  in the morning and you can take a Pepcid  at nighttime for your reflux.  If this does not help we will need to refer you to gastroenterology We have increased your Trelegy Ellipta  to the 200 strength make sure you rinse your mouth well after you use it We are going to get an echocardiogram which is a heart test to make sure there is no cardiac reason for your shortness of breath We will see him follow-up in 3 months time call sooner should any new problems arise  Please note: late entry documentation due to logistical difficulties during COVID-19 pandemic. This note is filed for information purposes only, and is not intended to be used for billing, nor does it represent the full scope/nature of the visit in question. Please see any associated scanned media linked to date of encounter for additional pertinent information.  Subjective:    HPI: Michael [name] is a 29 y.o. male presenting to the pulmonology clinic on 10/15/2019 with report of: Follow-up (PFT 10/14/2019--c/o sob with exertion, prod cough with clear to pale yellow mucus and wheezing. )     Outpatient Encounter Medications as of 10/15/2019  Medication Sig   famotidine -calcium  carbonate-magnesium hydroxide (PEPCID  COMPLETE) 10-800-165 MG chewable tablet Chew 1 tablet by mouth daily as needed (heartburn or indigestion).   [DISCONTINUED] albuterol  (VENTOLIN  HFA) 108 (90 Base) MCG/ACT inhaler Inhale 1-2 puffs into the lungs every 6 (six) hours as needed for wheezing or shortness of breath.   [DISCONTINUED] Fluticasone-Umeclidin-Vilant (TRELEGY ELLIPTA ) 100-62.5-25 MCG/INH AEPB Inhale 1 puff into the  lungs daily.   [DISCONTINUED] omeprazole  (PRILOSEC) 40 MG capsule Take 1 capsule (40 mg total) by mouth daily.   [DISCONTINUED] Skin Protectants, Misc. (CERAVE) OINT Apply 1 application topically daily as needed (itching).   [DISCONTINUED] Fluticasone-Umeclidin-Vilant (TRELEGY ELLIPTA ) 200-62.5-25 MCG/INH AEPB Inhale 1 puff into the lungs daily.   No facility-administered encounter medications on file as of 10/15/2019.      Objective:   Vitals:   10/15/19 0857  BP: 128/80  Pulse: 91  Temp: 98 F (36.7 C)  Height: 5' 7 (1.702 m)  Weight: 201 lb 9.6 oz (91.4 kg)  SpO2: 96%  TempSrc: Temporal  BMI (Calculated): 31.57     Physical exam documentation is limited by delayed entry of information.

## 2019-10-15 NOTE — Patient Instructions (Signed)
What we discussed today:   Take your omeprazole in the morning and you can take a Pepcid at nighttime for your reflux.  If this does not help we will need to refer you to gastroenterology  We have increased your Trelegy Ellipta to the 200 strength make sure you rinse your mouth well after you use it  We are going to get an echocardiogram which is a heart test to make sure there is no cardiac reason for your shortness of breath  We will see him follow-up in 3 months time call sooner should any new problems arise

## 2019-10-21 ENCOUNTER — Ambulatory Visit (INDEPENDENT_AMBULATORY_CARE_PROVIDER_SITE_OTHER): Payer: Medicare Other

## 2019-10-21 ENCOUNTER — Other Ambulatory Visit: Payer: Self-pay

## 2019-10-21 DIAGNOSIS — R0602 Shortness of breath: Secondary | ICD-10-CM | POA: Diagnosis not present

## 2019-10-21 LAB — ECHOCARDIOGRAM COMPLETE
AR max vel: 3.93 cm2
AV Area VTI: 4.17 cm2
AV Area mean vel: 3.72 cm2
AV Mean grad: 3 mmHg
AV Peak grad: 6 mmHg
Ao pk vel: 1.22 m/s
Area-P 1/2: 3.28 cm2
S' Lateral: 3.1 cm
Single Plane A4C EF: 56.4 %

## 2019-11-07 ENCOUNTER — Other Ambulatory Visit: Payer: Self-pay | Admitting: Pulmonary Disease

## 2019-11-11 DIAGNOSIS — F411 Generalized anxiety disorder: Secondary | ICD-10-CM | POA: Diagnosis not present

## 2019-11-11 DIAGNOSIS — F410 Panic disorder [episodic paroxysmal anxiety] without agoraphobia: Secondary | ICD-10-CM | POA: Diagnosis not present

## 2019-11-11 DIAGNOSIS — F4310 Post-traumatic stress disorder, unspecified: Secondary | ICD-10-CM | POA: Diagnosis not present

## 2019-11-12 DIAGNOSIS — Z0001 Encounter for general adult medical examination with abnormal findings: Secondary | ICD-10-CM | POA: Diagnosis not present

## 2019-11-12 DIAGNOSIS — Z79899 Other long term (current) drug therapy: Secondary | ICD-10-CM | POA: Diagnosis not present

## 2019-11-25 DIAGNOSIS — F4310 Post-traumatic stress disorder, unspecified: Secondary | ICD-10-CM | POA: Diagnosis not present

## 2019-11-25 DIAGNOSIS — F410 Panic disorder [episodic paroxysmal anxiety] without agoraphobia: Secondary | ICD-10-CM | POA: Diagnosis not present

## 2019-11-25 DIAGNOSIS — G4709 Other insomnia: Secondary | ICD-10-CM | POA: Diagnosis not present

## 2019-11-25 DIAGNOSIS — F411 Generalized anxiety disorder: Secondary | ICD-10-CM | POA: Diagnosis not present

## 2019-11-27 ENCOUNTER — Ambulatory Visit (INDEPENDENT_AMBULATORY_CARE_PROVIDER_SITE_OTHER): Payer: Medicare Other

## 2019-11-27 VITALS — Ht 67.0 in | Wt 210.0 lb

## 2019-11-27 DIAGNOSIS — Z0000 Encounter for general adult medical examination without abnormal findings: Secondary | ICD-10-CM | POA: Diagnosis not present
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
## 2019-11-27 NOTE — Patient Instructions (Addendum)
°  [name] , Thank you for taking time to come for your Medicare Wellness Visit. I appreciate your ongoing commitment to your health goals. Please review the following plan we discussed and let me know if I can assist you in the future.   These are the goals we discussed: Goals     Follow up with primary care provider     As needed       This is a list of the screening recommended for you and due dates:  Health Maintenance  Topic Date Due    Hepatitis C: One time screening is recommended by Center for Disease Control  (CDC) for  adults born from [birth year] through 1965.   Never done   Flu Shot  09/21/2019   Colon Cancer Screening  07/22/2020   Tetanus Vaccine  11/01/2027   COVID-19 Vaccine  Completed   HIV Screening  Completed    Immunizations Immunization History  Administered Date(s) Administered   Influenza,inj,Quad PF,6+ Mos 11/19/2015   Influenza-Unspecified 04/06/2018   Moderna SARS-COVID-2 Vaccination 05/16/2019, 06/17/2019   Tdap 11/19/2015, 10/31/2017   Keep all routine scheduled appointments.

## 2019-12-10 DIAGNOSIS — G4709 Other insomnia: Secondary | ICD-10-CM | POA: Diagnosis not present

## 2019-12-10 DIAGNOSIS — F4310 Post-traumatic stress disorder, unspecified: Secondary | ICD-10-CM | POA: Diagnosis not present

## 2019-12-10 DIAGNOSIS — F411 Generalized anxiety disorder: Secondary | ICD-10-CM | POA: Diagnosis not present

## 2019-12-10 DIAGNOSIS — F410 Panic disorder [episodic paroxysmal anxiety] without agoraphobia: Secondary | ICD-10-CM | POA: Diagnosis not present

## 2019-12-17 ENCOUNTER — Encounter: Payer: Self-pay | Admitting: Family Medicine

## 2019-12-17 DIAGNOSIS — D1720 Benign lipomatous neoplasm of skin and subcutaneous tissue of unspecified limb: Secondary | ICD-10-CM

## 2019-12-24 DIAGNOSIS — F4310 Post-traumatic stress disorder, unspecified: Secondary | ICD-10-CM | POA: Diagnosis not present

## 2019-12-24 DIAGNOSIS — G4709 Other insomnia: Secondary | ICD-10-CM | POA: Diagnosis not present

## 2019-12-24 DIAGNOSIS — F411 Generalized anxiety disorder: Secondary | ICD-10-CM | POA: Diagnosis not present

## 2019-12-24 DIAGNOSIS — F410 Panic disorder [episodic paroxysmal anxiety] without agoraphobia: Secondary | ICD-10-CM | POA: Diagnosis not present

## 2019-12-31 ENCOUNTER — Ambulatory Visit (INDEPENDENT_AMBULATORY_CARE_PROVIDER_SITE_OTHER): Payer: Medicare Other | Admitting: Dermatology

## 2019-12-31 ENCOUNTER — Other Ambulatory Visit: Payer: Self-pay

## 2019-12-31 ENCOUNTER — Encounter: Payer: Self-pay | Admitting: Dermatology

## 2019-12-31 DIAGNOSIS — R2241 Localized swelling, mass and lump, right lower limb: Secondary | ICD-10-CM | POA: Diagnosis not present

## 2019-12-31 DIAGNOSIS — I809 Phlebitis and thrombophlebitis of unspecified site: Secondary | ICD-10-CM

## 2019-12-31 MED ORDER — TRIAMCINOLONE ACETONIDE 0.1 % EX CREA
1.0000 | TOPICAL_CREAM | Freq: Two times a day (BID) | CUTANEOUS | 2 refills | Status: DC | PRN
Start: 2019-12-31 — End: 2021-04-29

## 2019-12-31 NOTE — Patient Instructions (Signed)
Topical steroids (such as triamcinolone, fluocinolone, fluocinonide, mometasone, clobetasol, halobetasol, betamethasone, hydrocortisone) can cause thinning and lightening of the skin if they are used for too long in the same area. Your physician has selected the right strength medicine for your problem and area affected on the body. Please use your medication only as directed by your physician to prevent side effects.   . 

## 2019-12-31 NOTE — Progress Notes (Signed)
   Follow-Up Visit   Subjective  Michael [name] is a 29 y.o. male who presents for the following: Skin Problem (Right medial knee. Patient c/o painful lipoma. Dur: 2 months. Growing fast. ).  He also has some associated knee pain.    The following portions of the chart were reviewed this encounter and updated as appropriate:     Review of Systems: No other skin or systemic complaints except as noted in HPI or Assessment and Plan.  Objective  Well appearing patient in no apparent distress; mood and affect are within normal limits.  A focused examination was performed including lower legs. Relevant physical exam findings are noted in the Assessment and Plan.  Right Knee - inferior/medial  Objective  Right Knee - inferior/medial: 4cm indistinct area of swelling with mild induration/dusky erythema inferior.  Tender to touch.  Assessment & Plan  Localized swelling of right lower leg Right Knee - inferior/medial  Possible Superficial thrombophlebitis +/- lipodermatosclerosis, doubt lipoma  Plan referral to [facility] for further evaluation and treatment.  He could also consider seeing an orthopedist since he has associated knee pain.  Start Triamcinolone 0.1% cream BID to affected area. Avoid f/g/a  triamcinolone cream (KENALOG) 0.1 % - Right Knee - inferior/medial  Return if symptoms worsen or fail to improve.   I, Emelia Salisbury, CMA, am acting as scribe for Brendolyn Patty, MD.  Documentation: I have reviewed the above documentation for accuracy and completeness, and I agree with the above.  Brendolyn Patty MD

## 2020-01-07 DIAGNOSIS — G4709 Other insomnia: Secondary | ICD-10-CM | POA: Diagnosis not present

## 2020-01-07 DIAGNOSIS — F4310 Post-traumatic stress disorder, unspecified: Secondary | ICD-10-CM | POA: Diagnosis not present

## 2020-01-07 DIAGNOSIS — F411 Generalized anxiety disorder: Secondary | ICD-10-CM | POA: Diagnosis not present

## 2020-01-20 ENCOUNTER — Encounter (INDEPENDENT_AMBULATORY_CARE_PROVIDER_SITE_OTHER): Payer: Medicare Other | Admitting: Vascular Surgery

## 2020-01-20 ENCOUNTER — Encounter (INDEPENDENT_AMBULATORY_CARE_PROVIDER_SITE_OTHER): Payer: Self-pay

## 2020-01-29 DIAGNOSIS — G479 Sleep disorder, unspecified: Secondary | ICD-10-CM | POA: Diagnosis not present

## 2020-01-29 DIAGNOSIS — F4310 Post-traumatic stress disorder, unspecified: Secondary | ICD-10-CM | POA: Diagnosis not present

## 2020-01-29 DIAGNOSIS — F411 Generalized anxiety disorder: Secondary | ICD-10-CM | POA: Diagnosis not present

## 2020-02-12 DIAGNOSIS — G4709 Other insomnia: Secondary | ICD-10-CM | POA: Diagnosis not present

## 2020-02-12 DIAGNOSIS — F4310 Post-traumatic stress disorder, unspecified: Secondary | ICD-10-CM | POA: Diagnosis not present

## 2020-02-12 DIAGNOSIS — F411 Generalized anxiety disorder: Secondary | ICD-10-CM | POA: Diagnosis not present

## 2020-03-04 ENCOUNTER — Other Ambulatory Visit: Payer: Self-pay | Admitting: Pulmonary Disease

## 2020-03-09 ENCOUNTER — Encounter (INDEPENDENT_AMBULATORY_CARE_PROVIDER_SITE_OTHER): Payer: Medicare Other | Admitting: Vascular Surgery

## 2020-03-09 ENCOUNTER — Encounter (INDEPENDENT_AMBULATORY_CARE_PROVIDER_SITE_OTHER): Payer: Self-pay

## 2020-03-11 DIAGNOSIS — F411 Generalized anxiety disorder: Secondary | ICD-10-CM | POA: Diagnosis not present

## 2020-03-11 DIAGNOSIS — G4709 Other insomnia: Secondary | ICD-10-CM | POA: Diagnosis not present

## 2020-03-11 DIAGNOSIS — F4310 Post-traumatic stress disorder, unspecified: Secondary | ICD-10-CM | POA: Diagnosis not present

## 2020-03-11 DIAGNOSIS — F410 Panic disorder [episodic paroxysmal anxiety] without agoraphobia: Secondary | ICD-10-CM | POA: Diagnosis not present

## 2020-03-24 ENCOUNTER — Telehealth: Payer: Medicare Other | Admitting: Nurse Practitioner

## 2020-03-24 DIAGNOSIS — R5081 Fever presenting with conditions classified elsewhere: Secondary | ICD-10-CM | POA: Diagnosis not present

## 2020-03-24 DIAGNOSIS — R059 Cough, unspecified: Secondary | ICD-10-CM

## 2020-03-24 DIAGNOSIS — R0602 Shortness of breath: Secondary | ICD-10-CM

## 2020-03-24 DIAGNOSIS — Z20822 Contact with and (suspected) exposure to covid-19: Secondary | ICD-10-CM | POA: Diagnosis not present

## 2020-03-24 MED ORDER — ALBUTEROL SULFATE HFA 108 (90 BASE) MCG/ACT IN AERS
2.0000 | INHALATION_SPRAY | Freq: Four times a day (QID) | RESPIRATORY_TRACT | 0 refills | Status: DC | PRN
Start: 2020-03-24 — End: 2020-07-09

## 2020-03-24 MED ORDER — BENZONATATE 100 MG PO CAPS: 100.0000 mg | ORAL_CAPSULE | Freq: Three times a day (TID) | ORAL | 0 refills | Status: DC | PRN

## 2020-03-24 NOTE — Progress Notes (Signed)
E-Visit for Corona Virus Screening  Your current symptoms could be consistent with the coronavirus.  Many health care providers can now test patients at their office but not all are.  [facility] has multiple testing sites. For information on our COVID testing locations and hours go to [email or web address]   Testing Information: The COVID-19 Community Testing sites are testing BY APPOINTMENT ONLY.  You can schedule online at [email or web address]  If you do not have access to a smart phone or computer you may call [phone] for an appointment.   Additional testing sites in the Community:  . For [facility] Testing sites in [facility]  FaceUpdate.[email or web address]  . For Pop-up testing sites in [place]  BowlDirectory.co.uk  . For [facility] [email or web address]  . For [facility] testing in [place] and [employer] [email or web address]  . For [facility] testing in [facility]   [email or web address]  For  more information about community testing call [phone]   Please quarantine yourself while awaiting your test results. Please stay home for a minimum of 10 days from the first day of illness with improving symptoms and you have had 24 hours of no fever (without the use of Tylenol (Acetaminophen) Motrin (Ibuprofen) or any fever reducing medication).  Also - Do not get tested prior to returning to work because once you have had a positive test the test can stay positive for more than a month in some cases.   You should wear a mask or cloth face covering over your nose and mouth if you must be around other people or animals, including pets (even at home). Try to  stay at least 6 feet away from other people. This will protect the people around you.  Please continue good preventive care measures, including:  frequent hand-washing, avoid touching your face, cover coughs/sneezes, stay out of crowds and keep a 6 foot distance from others.  COVID-19 is a respiratory illness with symptoms that are similar to the flu. Symptoms are typically mild to moderate, but there have been cases of severe illness and death due to the virus.   The following symptoms may appear 2-14 days after exposure: . Fever . Cough . Shortness of breath or difficulty breathing . Chills . Repeated shaking with chills . Muscle pain . Headache . Sore throat . New loss of taste or smell . Fatigue . Congestion or runny nose . Nausea or vomiting . Diarrhea  Go to the nearest hospital ED for assessment if fever/cough/breathlessness are severe or illness seems like a threat to life.  It is vitally important that if you feel that you have an infection such as this virus or any other virus that you stay home and away from places where you may spread it to others.  You should avoid contact with people age 25 and older.   You can use medication such as A prescription cough medication called Tessalon Perles 100 mg. You may take 1-2 capsules every 8 hours as needed for cough and A prescription inhaler called Albuterol MDI 90 mcg /actuation 2 puffs every 4 hours as needed for shortness of breath, wheezing, cough  You may also take acetaminophen (Tylenol) as needed for fever.  Reduce your risk of any infection by using the same precautions used for avoiding the common cold or flu:  Marland Kitchen Wash your hands often with soap and warm water for at least 20 seconds.  If soap and water are not readily available, use an alcohol-based hand sanitizer with at least  60% alcohol.  . If coughing or sneezing, cover your mouth and nose by coughing or sneezing into the elbow areas of your shirt or coat, into a tissue or  into your sleeve (not your hands). . Avoid shaking hands with others and consider head nods or verbal greetings only. . Avoid touching your eyes, nose, or mouth with unwashed hands.  . Avoid close contact with people who are sick. . Avoid places or events with large numbers of people in one location, like concerts or sporting events. . Carefully consider travel plans you have or are making. . If you are planning any travel outside or inside the [place], visit the CDC's Travelers' Health webpage for the latest health notices. . If you have some symptoms but not all symptoms, continue to monitor at home and seek medical attention if your symptoms worsen. . If you are having a medical emergency, call 911.  HOME CARE . Only take medications as instructed by your medical team. . Drink plenty of fluids and get plenty of rest. . A steam or ultrasonic humidifier can help if you have congestion.   GET HELP RIGHT AWAY IF YOU HAVE EMERGENCY WARNING SIGNS** FOR COVID-19. If you or someone is showing any of these signs seek emergency medical care immediately. Call 911 or proceed to your closest emergency facility if: . You develop worsening high fever. . Trouble breathing . Bluish lips or face . Persistent pain or pressure in the chest . New confusion . Inability to wake or stay awake . You cough up blood. . Your symptoms become more severe  **This list is not all possible symptoms. Contact your medical provider for any symptoms that are sever or concerning to you.  MAKE SURE YOU   Understand these instructions.  Will watch your condition.  Will get help right away if you are not doing well or get worse.  Your e-visit answers were reviewed by a board certified advanced clinical practitioner to complete your personal care plan.  Depending on the condition, your plan could have included both over the counter or prescription medications.  If there is a problem please reply once you have received a  response from your provider.  Your safety is important to [place].  If you have drug allergies check your prescription carefully.    You can use MyChart to ask questions about today's visit, request a non-urgent call back, or ask for a work or school excuse for 24 hours related to this e-Visit. If it has been greater than 24 hours you will need to follow up with your provider, or enter a new e-Visit to address those concerns. You will get an e-mail in the next two days asking about your experience.  I hope that your e-visit has been valuable and will speed your recovery. Thank you for using e-visits.  5-10 minutes spent reviewing and documenting in chart.

## 2020-04-09 DIAGNOSIS — F410 Panic disorder [episodic paroxysmal anxiety] without agoraphobia: Secondary | ICD-10-CM | POA: Diagnosis not present

## 2020-04-09 DIAGNOSIS — F4310 Post-traumatic stress disorder, unspecified: Secondary | ICD-10-CM | POA: Diagnosis not present

## 2020-04-09 DIAGNOSIS — F411 Generalized anxiety disorder: Secondary | ICD-10-CM | POA: Diagnosis not present

## 2020-04-09 DIAGNOSIS — G4709 Other insomnia: Secondary | ICD-10-CM | POA: Diagnosis not present

## 2020-05-05 ENCOUNTER — Other Ambulatory Visit: Payer: Self-pay | Admitting: Pulmonary Disease

## 2020-05-07 DIAGNOSIS — F411 Generalized anxiety disorder: Secondary | ICD-10-CM | POA: Diagnosis not present

## 2020-05-07 DIAGNOSIS — G4709 Other insomnia: Secondary | ICD-10-CM | POA: Diagnosis not present

## 2020-05-07 DIAGNOSIS — F410 Panic disorder [episodic paroxysmal anxiety] without agoraphobia: Secondary | ICD-10-CM | POA: Diagnosis not present

## 2020-05-07 DIAGNOSIS — F4310 Post-traumatic stress disorder, unspecified: Secondary | ICD-10-CM | POA: Diagnosis not present

## 2020-06-04 DIAGNOSIS — F4310 Post-traumatic stress disorder, unspecified: Secondary | ICD-10-CM | POA: Diagnosis not present

## 2020-06-04 DIAGNOSIS — G4709 Other insomnia: Secondary | ICD-10-CM | POA: Diagnosis not present

## 2020-06-04 DIAGNOSIS — F411 Generalized anxiety disorder: Secondary | ICD-10-CM | POA: Diagnosis not present

## 2020-06-04 DIAGNOSIS — F410 Panic disorder [episodic paroxysmal anxiety] without agoraphobia: Secondary | ICD-10-CM | POA: Diagnosis not present

## 2020-07-02 DIAGNOSIS — G4709 Other insomnia: Secondary | ICD-10-CM | POA: Diagnosis not present

## 2020-07-02 DIAGNOSIS — F410 Panic disorder [episodic paroxysmal anxiety] without agoraphobia: Secondary | ICD-10-CM | POA: Diagnosis not present

## 2020-07-02 DIAGNOSIS — F4310 Post-traumatic stress disorder, unspecified: Secondary | ICD-10-CM | POA: Diagnosis not present

## 2020-07-02 DIAGNOSIS — F411 Generalized anxiety disorder: Secondary | ICD-10-CM | POA: Diagnosis not present

## 2020-07-08 ENCOUNTER — Encounter: Payer: Self-pay | Admitting: Family Medicine

## 2020-07-09 ENCOUNTER — Ambulatory Visit
Admission: RE | Admit: 2020-07-09 | Discharge: 2020-07-09 | Disposition: A | Discharge status: Home / Self Care | Payer: Medicare Other | Source: Ambulatory Visit | Attending: Internal Medicine | Admitting: Internal Medicine

## 2020-07-09 ENCOUNTER — Ambulatory Visit
Admission: RE | Admit: 2020-07-09 | Discharge: 2020-07-09 | Disposition: A | Discharge status: Home / Self Care | Payer: Medicare Other | Attending: Internal Medicine | Admitting: Internal Medicine

## 2020-07-09 ENCOUNTER — Other Ambulatory Visit: Payer: Self-pay

## 2020-07-09 ENCOUNTER — Telehealth (INDEPENDENT_AMBULATORY_CARE_PROVIDER_SITE_OTHER): Payer: Medicare Other | Admitting: Internal Medicine

## 2020-07-09 VITALS — Ht 67.0 in | Wt 190.0 lb

## 2020-07-09 DIAGNOSIS — J4520 Mild intermittent asthma, uncomplicated: Secondary | ICD-10-CM | POA: Diagnosis not present

## 2020-07-09 DIAGNOSIS — R490 Dysphonia: Secondary | ICD-10-CM | POA: Diagnosis not present

## 2020-07-09 DIAGNOSIS — M25552 Pain in left hip: Secondary | ICD-10-CM | POA: Diagnosis not present

## 2020-07-09 DIAGNOSIS — Z13818 Encounter for screening for other digestive system disorders: Secondary | ICD-10-CM

## 2020-07-09 DIAGNOSIS — K219 Gastro-esophageal reflux disease without esophagitis: Secondary | ICD-10-CM

## 2020-07-09 DIAGNOSIS — M549 Dorsalgia, unspecified: Secondary | ICD-10-CM

## 2020-07-09 DIAGNOSIS — R079 Chest pain, unspecified: Secondary | ICD-10-CM

## 2020-07-09 DIAGNOSIS — R1013 Epigastric pain: Secondary | ICD-10-CM

## 2020-07-09 DIAGNOSIS — Z1329 Encounter for screening for other suspected endocrine disorder: Secondary | ICD-10-CM

## 2020-07-09 DIAGNOSIS — R059 Cough, unspecified: Secondary | ICD-10-CM | POA: Diagnosis not present

## 2020-07-09 DIAGNOSIS — J40 Bronchitis, not specified as acute or chronic: Secondary | ICD-10-CM | POA: Diagnosis not present

## 2020-07-09 DIAGNOSIS — R634 Abnormal weight loss: Secondary | ICD-10-CM

## 2020-07-09 DIAGNOSIS — M546 Pain in thoracic spine: Secondary | ICD-10-CM | POA: Diagnosis not present

## 2020-07-09 MED ORDER — TRELEGY ELLIPTA 200-62.5-25 MCG/INH IN AEPB: 1.0000 | INHALATION_SPRAY | Freq: Every day | RESPIRATORY_TRACT | 11 refills | Status: DC

## 2020-07-09 MED ORDER — ALBUTEROL SULFATE HFA 108 (90 BASE) MCG/ACT IN AERS: 2.0000 | INHALATION_SPRAY | Freq: Four times a day (QID) | RESPIRATORY_TRACT | 11 refills | Status: DC | PRN
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
## 2020-07-10 LAB — CBC WITH DIFFERENTIAL/PLATELET
Basophils Absolute: 0.1 10*3/uL (ref 0.0–0.2)
Basos: 1 %
EOS (ABSOLUTE): 0.7 10*3/uL — ABNORMAL HIGH (ref 0.0–0.4)
Eos: 5 %
Hematocrit: 46.9 % (ref 37.5–51.0)
Hemoglobin: 16.1 g/dL (ref 13.0–17.7)
Immature Grans (Abs): 0 10*3/uL (ref 0.0–0.1)
Immature Granulocytes: 0 %
Lymphocytes Absolute: 2.8 10*3/uL (ref 0.7–3.1)
Lymphs: 19 %
MCH: 32.1 pg (ref 26.6–33.0)
MCHC: 34.3 g/dL (ref 31.5–35.7)
MCV: 93 fL (ref 79–97)
Monocytes Absolute: 0.9 10*3/uL (ref 0.1–0.9)
Monocytes: 6 %
Neutrophils Absolute: 10.3 10*3/uL — ABNORMAL HIGH (ref 1.4–7.0)
Neutrophils: 69 %
Platelets: 216 10*3/uL (ref 150–450)
RBC: 5.02 x10E6/uL (ref 4.14–5.80)
RDW: 13 % (ref 11.6–15.4)
WBC: 14.8 10*3/uL — ABNORMAL HIGH (ref 3.4–10.8)

## 2020-07-10 LAB — COMPREHENSIVE METABOLIC PANEL
ALT: 38 IU/L (ref 0–44)
AST: 20 IU/L (ref 0–40)
Albumin/Globulin Ratio: 2.4 — ABNORMAL HIGH (ref 1.2–2.2)
Albumin: 5.2 g/dL (ref 4.1–5.2)
Alkaline Phosphatase: 91 IU/L (ref 44–121)
BUN/Creatinine Ratio: 12 (ref 9–20)
BUN: 11 mg/dL (ref 6–20)
Bilirubin Total: 0.3 mg/dL (ref 0.0–1.2)
CO2: 19 mmol/L — ABNORMAL LOW (ref 20–29)
Calcium: 10.3 mg/dL — ABNORMAL HIGH (ref 8.7–10.2)
Chloride: 100 mmol/L (ref 96–106)
Creatinine, Ser: 0.89 mg/dL (ref 0.76–1.27)
Globulin, Total: 2.2 g/dL (ref 1.5–4.5)
Glucose: 90 mg/dL (ref 65–99)
Potassium: 4.3 mmol/L (ref 3.5–5.2)
Sodium: 140 mmol/L (ref 134–144)
Total Protein: 7.4 g/dL (ref 6.0–8.5)
eGFR: 119 mL/min/{1.73_m2} (ref >59)

## 2020-07-10 LAB — URINALYSIS, ROUTINE W REFLEX MICROSCOPIC
Bilirubin, UA: NEGATIVE
Glucose, UA: NEGATIVE
Ketones, UA: NEGATIVE
Leukocytes,UA: NEGATIVE
Nitrite, UA: NEGATIVE
Protein,UA: NEGATIVE
RBC, UA: NEGATIVE
Specific Gravity, UA: 1.014 (ref 1.005–1.030)
Urobilinogen, Ur: 0.2 mg/dL (ref 0.2–1.0)
pH, UA: 6 (ref 5.0–7.5)

## 2020-07-10 LAB — TROPONIN T: Troponin T (Highly Sensitive): <6 ng/L (ref 0–22)

## 2020-07-10 LAB — TSH: TSH: 1.26 u[IU]/mL (ref 0.450–4.500)

## 2020-07-10 LAB — D-DIMER, QUANTITATIVE: D-DIMER: <0.2 mg/L FEU (ref 0.00–0.49)

## 2020-07-10 LAB — HEPATITIS C ANTIBODY: Hep C Virus Ab: <0.1 s/co ratio (ref 0.0–0.9)

## 2020-07-12 ENCOUNTER — Encounter: Payer: Self-pay | Admitting: *Deleted

## 2020-07-12 ENCOUNTER — Encounter: Payer: Self-pay | Admitting: Internal Medicine

## 2020-07-12 NOTE — Progress Notes (Addendum)
Virtual Visit via Video Note  I connected with Michael [name]  on 07/09/20 at  1:00 PM EDT by a video enabled telemedicine application and verified that I am speaking with the correct person using two identifiers.  Location patient: home, [place] Location provider:work or home office Persons participating in the virtual visit: patient, provider  I discussed the limitations of evaluation and management by telemedicine and the availability of in person appointments. The patient expressed understanding and agreed to proceed.   HPI:  Acute telemedicine visit for : multiple complaints 1. Dry cough x 1 year 02/2020 had covid and smoker now 2-3 cig per day from 2 ppd no THC but at times prior did edibles using albuterol inhaler and trelegy.  Will consult pulmonary again. He reports h/o asthma and GERD and he works as a Building control surveyor   2. C/o mid back pain and left hip pain rad to left thigh at times 2-3/10 to 5-6/10 x 3-4 months tried otc meds with some relief  denies drug use currently but previously h/o heroin and cocaine from 48 - 76 y.o   3. C/o wt loss 15 lbs reduced po intake and epigastric ab pain x3-4 months 2-6/10 at times intermittent with h/o GERD on prilosec 40 mg qd and pepcid w/o relief  He denies etoh use  4. C/o hoarse voice x 3-4 months with h/o prior drug use he states scope with ent is difficult to do but advised him to mention to ent and will refer back to ENT  5. C/o left sided chest pain with mid back radiation tried otc meds w/o relief no other associated sx's  -COVID-19 vaccine status: 2/2 moderna    ROS: See pertinent positives and negatives per HPI.  Past Medical History:  Diagnosis Date  . Allergy   . Anal fissure 04/06/2017  . Anxiety   . Asthma   . Chest pain off and on  . COVID-19    02/2020  . Depression   . Family history of adverse reaction to anesthesia    adopted history unknown  . GERD (gastroesophageal reflux disease)   . Pancreatitis last 2015  . Substance  abuse ([place])    per pt heroin/cocaine age [age]     Past Surgical History:  Procedure Laterality Date  . CHOLECYSTECTOMY  08/16/2015   Procedure: LAPAROSCOPIC CHOLECYSTECTOMY;  Surgeon: Alphonsa Overall, MD;  Location: [facility];  Service: General;;  . ESOPHAGOGASTRODUODENOSCOPY (EGD) WITH PROPOFOL N/A 07/27/2016   Procedure: ESOPHAGOGASTRODUODENOSCOPY (EGD) WITH PROPOFOL;  Surgeon: Milus Banister, MD;  Location: [facility];  Service: Endoscopy;  Laterality: N/A;  . ESOPHAGOGASTRODUODENOSCOPY (EGD) WITH PROPOFOL N/A 10/10/2017   Procedure: ESOPHAGOGASTRODUODENOSCOPY (EGD) WITH PROPOFOL;  Surgeon: Virgel Manifold, MD;  Location: [facility] END[facility]COPY;  Service: Endoscopy;  Laterality: N/A;  . TONSILLECTOMY AND ADENOIDECTOMY  as child   and adenoids     Current Outpatient Medications:  .  buPROPion (WELLBUTRIN XL) 150 MG 24 hr tablet, Take 1 tablet by mouth every morning., Disp: , Rfl:  .  clonazePAM (KLONOPIN) 0.5 MG disintegrating tablet, Take 0.5 mg by mouth 3 (three) times daily as needed., Disp: , Rfl:  .  DULoxetine (CYMBALTA) 20 MG capsule, Take 20 mg by mouth 2 (two) times daily., Disp: , Rfl:  .  eszopiclone (LUNESTA) 1 MG TABS tablet, Take 1 mg by mouth at bedtime., Disp: , Rfl:  .  famotidine-calcium carbonate-magnesium hydroxide (PEPCID COMPLETE) 10-800-165 MG chewable tablet, Chew 1 tablet by mouth daily as needed., Disp: , Rfl:  .  hydrOXYzine (ATARAX/VISTARIL) 50 MG tablet, Take 50 mg by mouth at bedtime., Disp: , Rfl:  .  omeprazole (PRILOSEC) 40 MG capsule, TAKE 1 CAPSULE BY MOUTH ONCE DAILY, Disp: 30 capsule, Rfl: 2 .  PARoxetine (PAXIL) 30 MG tablet, Take 30 mg by mouth daily., Disp: , Rfl:  .  prazosin (MINIPRESS) 5 MG capsule, Take 5 mg by mouth at bedtime., Disp: , Rfl:  .  triamcinolone cream (KENALOG) 0.1 %, Apply 1 application topically 2 (two) times daily as needed., Disp: 80 g, Rfl: 2 .  albuterol (VENTOLIN HFA) 108 (90 Base) MCG/ACT inhaler, Inhale 2 puffs into the lungs  every 6 (six) hours as needed for wheezing or shortness of breath., Disp: 18 g, Rfl: 11 .  Fluticasone-Umeclidin-Vilant (TRELEGY ELLIPTA) 200-62.5-25 MCG/INH AEPB, Inhale 1 puff into the lungs daily. Rinse mouth, Disp: 60 each, Rfl: 11 .  Skin Protectants, Misc. (CERAVE) OINT, Apply 1 application topically daily as needed (itching). (Patient not taking: Reported on 07/09/2020), Disp: , Rfl:   EXAM:  VITALS per patient if applicable:  GENERAL: alert, oriented, appears well and in no acute distress  HEENT: atraumatic, conjunttiva clear, no obvious abnormalities on inspection of external nose and ears  NECK: normal movements of the head and neck  LUNGS: on inspection no signs of respiratory distress, breathing rate appears normal, no obvious gross SOB, gasping or wheezing  CV: no obvious cyanosis  MS: moves all visible extremities without noticeable abnormality  PSYCH/NEURO: pleasant and cooperative, no obvious depression or anxiety, speech and thought processing grossly intact  ASSESSMENT AND PLAN:  Discussed the following assessment and plan:  Left-sided chest pain - Plan: DG Chest 2 View, Troponin T, D-Dimer, Quantitative both labs neg If continues consider diagnostic mammo/us to r/u breast etiology though could be MSK   07/09/20 mid back Xray  Mild multilevel degenerative disc disease with tiny anterior osteophytes at a few levels. No fracture or traumatic malalignment.  IMPRESSION: Mild degenerative disc disease as above with a few tiny anterior osteophytes. No other abnormalities.   Electronically Signed   By: Dorise Bullion III M.D   On: 07/12/2020 11:42 Left hip pain likely MSK denies current drug use considered OM - Plan: DG Hip Unilat W OR W/O Pelvis 2-3 Views Left XAM: DG HIP (WITH OR WITHOUT PELVIS) 2-3V LEFT  COMPARISON:  None.  FINDINGS: There is no evidence of hip fracture or dislocation. There is no evidence of arthropathy or other focal bone  abnormality.  IMPRESSION: Negative.   Electronically Signed   By: Dorise Bullion III M.D   On: 07/12/2020 11:43  Mid back pain - Plan: DG Thoracic Spine 2 View Prn tylenol  If continues consider ortho referral   Weight loss - Plan: Ambulatory referral to Gastroenterology Gastroesophageal reflux disease without esophagitis - Plan: Ambulatory referral to Gastroenterology [facility]  On prilosec 40 mg qd and pepcid not helping  Abdominal pain, epigastric - Plan: Comprehensive metabolic panel, CBC with Differential/Platelet, Urinalysis, Routine w reflex microscopic, US Abdomen Complete, Ambulatory referral to Gastroenterology   Bronchitis vs asthma with h/o covid 02/2020 and dry cough x 1 year he is a Building control surveyor- Plan: Ambulatory referral to Pulmonology Dr. Patsey Berthold on trelegy and prn albuterol  Hoarseness of voice - Plan: Ambulatory referral to ENT   Specialist  [facility] [place]  [facility] Dr. Debe Coder -we discussed possible serious and likely etiologies, options for evaluation and workup, limitations of telemedicine visit vs in person visit, treatment, treatment risks and  precautions.     I discussed the assessment and treatment plan with the patient. The patient was provided an opportunity to ask questions and all were answered. The patient agreed with the plan and demonstrated an understanding of the instructions.    Time spent 30 min Delorise Jackson, MD

## 2020-07-12 NOTE — Addendum Note (Signed)
Addended by: Orland Mustard on: 07/12/2020 05:49 PM   Modules accepted: Orders

## 2020-07-16 ENCOUNTER — Ambulatory Visit (HOSPITAL_COMMUNITY): Payer: Medicare Other

## 2020-07-16 ENCOUNTER — Encounter (HOSPITAL_COMMUNITY): Payer: Self-pay

## 2020-07-26 DIAGNOSIS — M25552 Pain in left hip: Secondary | ICD-10-CM | POA: Diagnosis not present

## 2020-07-26 DIAGNOSIS — M25852 Other specified joint disorders, left hip: Secondary | ICD-10-CM | POA: Diagnosis not present

## 2020-07-31 ENCOUNTER — Other Ambulatory Visit: Payer: Self-pay | Admitting: Pulmonary Disease

## 2020-08-02 DIAGNOSIS — G4709 Other insomnia: Secondary | ICD-10-CM | POA: Diagnosis not present

## 2020-08-02 DIAGNOSIS — F410 Panic disorder [episodic paroxysmal anxiety] without agoraphobia: Secondary | ICD-10-CM | POA: Diagnosis not present

## 2020-08-02 DIAGNOSIS — F4310 Post-traumatic stress disorder, unspecified: Secondary | ICD-10-CM | POA: Diagnosis not present

## 2020-08-02 DIAGNOSIS — F411 Generalized anxiety disorder: Secondary | ICD-10-CM | POA: Diagnosis not present

## 2020-08-04 ENCOUNTER — Other Ambulatory Visit: Payer: Self-pay | Admitting: Sports Medicine

## 2020-08-04 DIAGNOSIS — M25852 Other specified joint disorders, left hip: Secondary | ICD-10-CM

## 2020-08-04 DIAGNOSIS — M25559 Pain in unspecified hip: Secondary | ICD-10-CM

## 2020-08-18 ENCOUNTER — Encounter: Payer: Self-pay | Admitting: Gastroenterology

## 2020-08-18 ENCOUNTER — Ambulatory Visit: Payer: Medicare Other | Admitting: Primary Care

## 2020-08-30 DIAGNOSIS — G4709 Other insomnia: Secondary | ICD-10-CM | POA: Diagnosis not present

## 2020-08-30 DIAGNOSIS — F4310 Post-traumatic stress disorder, unspecified: Secondary | ICD-10-CM | POA: Diagnosis not present

## 2020-08-30 DIAGNOSIS — F410 Panic disorder [episodic paroxysmal anxiety] without agoraphobia: Secondary | ICD-10-CM | POA: Diagnosis not present

## 2020-08-30 DIAGNOSIS — F411 Generalized anxiety disorder: Secondary | ICD-10-CM | POA: Diagnosis not present

## 2020-09-15 DIAGNOSIS — G4709 Other insomnia: Secondary | ICD-10-CM | POA: Diagnosis not present

## 2020-09-15 DIAGNOSIS — F410 Panic disorder [episodic paroxysmal anxiety] without agoraphobia: Secondary | ICD-10-CM | POA: Diagnosis not present

## 2020-09-15 DIAGNOSIS — F4310 Post-traumatic stress disorder, unspecified: Secondary | ICD-10-CM | POA: Diagnosis not present

## 2020-09-15 DIAGNOSIS — F411 Generalized anxiety disorder: Secondary | ICD-10-CM | POA: Diagnosis not present

## 2020-09-27 DIAGNOSIS — G4709 Other insomnia: Secondary | ICD-10-CM | POA: Diagnosis not present

## 2020-09-27 DIAGNOSIS — F411 Generalized anxiety disorder: Secondary | ICD-10-CM | POA: Diagnosis not present

## 2020-09-27 DIAGNOSIS — F410 Panic disorder [episodic paroxysmal anxiety] without agoraphobia: Secondary | ICD-10-CM | POA: Diagnosis not present

## 2020-09-27 DIAGNOSIS — F4310 Post-traumatic stress disorder, unspecified: Secondary | ICD-10-CM | POA: Diagnosis not present

## 2020-10-13 DIAGNOSIS — F4310 Post-traumatic stress disorder, unspecified: Secondary | ICD-10-CM | POA: Diagnosis not present

## 2020-10-13 DIAGNOSIS — G4709 Other insomnia: Secondary | ICD-10-CM | POA: Diagnosis not present

## 2020-10-13 DIAGNOSIS — F3181 Bipolar II disorder: Secondary | ICD-10-CM | POA: Diagnosis not present

## 2020-10-13 DIAGNOSIS — F411 Generalized anxiety disorder: Secondary | ICD-10-CM | POA: Diagnosis not present

## 2020-10-13 DIAGNOSIS — F410 Panic disorder [episodic paroxysmal anxiety] without agoraphobia: Secondary | ICD-10-CM | POA: Diagnosis not present

## 2020-10-28 ENCOUNTER — Other Ambulatory Visit: Payer: Self-pay | Admitting: Pulmonary Disease

## 2020-10-28 ENCOUNTER — Other Ambulatory Visit: Payer: Self-pay | Admitting: Family Medicine

## 2020-11-03 ENCOUNTER — Ambulatory Visit
Admission: RE | Admit: 2020-11-03 | Discharge: 2020-11-03 | Disposition: A | Discharge status: Home / Self Care | Payer: Medicare Other | Source: Ambulatory Visit | Attending: Family Medicine | Admitting: Family Medicine

## 2020-11-03 ENCOUNTER — Ambulatory Visit (INDEPENDENT_AMBULATORY_CARE_PROVIDER_SITE_OTHER): Payer: Medicare Other | Admitting: Family Medicine

## 2020-11-03 ENCOUNTER — Encounter: Payer: Self-pay | Admitting: Family Medicine

## 2020-11-03 ENCOUNTER — Other Ambulatory Visit: Payer: Self-pay

## 2020-11-03 VITALS — BP 130/80 | HR 76 | Temp 98.9°F | Ht 67.0 in | Wt 197.4 lb

## 2020-11-03 DIAGNOSIS — J40 Bronchitis, not specified as acute or chronic: Secondary | ICD-10-CM | POA: Diagnosis not present

## 2020-11-03 DIAGNOSIS — H43399 Other vitreous opacities, unspecified eye: Secondary | ICD-10-CM | POA: Insufficient documentation

## 2020-11-03 DIAGNOSIS — F419 Anxiety disorder, unspecified: Secondary | ICD-10-CM | POA: Diagnosis not present

## 2020-11-03 DIAGNOSIS — R519 Headache, unspecified: Secondary | ICD-10-CM | POA: Diagnosis not present

## 2020-11-03 DIAGNOSIS — S0990XA Unspecified injury of head, initial encounter: Secondary | ICD-10-CM

## 2020-11-03 DIAGNOSIS — Z23 Encounter for immunization: Secondary | ICD-10-CM | POA: Diagnosis not present

## 2020-11-03 DIAGNOSIS — H43393 Other vitreous opacities, bilateral: Secondary | ICD-10-CM

## 2020-11-03 MED ORDER — DOXYCYCLINE HYCLATE 100 MG PO TABS: 100.0000 mg | ORAL_TABLET | Freq: Two times a day (BID) | ORAL | 0 refills | Status: DC
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
## 2020-11-03 NOTE — Assessment & Plan Note (Signed)
Ongoing issues with floaters and very dull flashes.  Discussed the need to see ophthalmology.  Discussed that if he has an acute change in these things he needs to be evaluated immediately in the emergency department.  Discussed if it takes more than a couple of weeks to get into see ophthalmology he could see an optometrist for an exam as well.

## 2020-11-03 NOTE — Patient Instructions (Signed)
Nice to see you. We can get a CT scan of your head today to evaluate for cause of your headaches. I have referred you to an ophthalmologist.  Somebody should contact you to get that scheduled.  If they are not able to see you in the next several weeks please let us know and you can go see an optometrist of your choosing. If you develop worsening headache, sudden onset weakness, numbness, vision changes, sudden increase in floaters, or bright flashing lights you need to be evaluated in the emergency department.

## 2020-11-03 NOTE — Assessment & Plan Note (Signed)
The patient has a remote history of head trauma about 3 months ago.  Since then he has been having intermittent headaches and has noticed some weakness in his right arm.  Certainly this could be related to a concussion though we need to rule out intracranial pathology.  Discussed getting a stat CT scan to evaluate for cause of this.  This will be completed today.  He was advised to seek medical attention in the emergency department for any worsening headaches, severe headaches, numbness, weakness, changes in vision, sudden increase in floaters, or bright flashing lights.

## 2020-11-03 NOTE — Assessment & Plan Note (Signed)
Chronic ongoing issue.  He will continue to see his psychiatrist.

## 2020-11-03 NOTE — Assessment & Plan Note (Signed)
Suspect bronchitis versus sinusitis or a combination of both with his current symptoms.  We will treat with doxycycline.  If not improving with this he will let us know.  He does have an allergy to prednisone and thus we will hold off on steroids at this time.

## 2020-11-08 ENCOUNTER — Other Ambulatory Visit: Payer: Self-pay | Admitting: Family Medicine

## 2020-11-08 DIAGNOSIS — F0781 Postconcussional syndrome: Secondary | ICD-10-CM

## 2020-11-09 ENCOUNTER — Telehealth: Payer: Self-pay | Admitting: Family Medicine

## 2020-11-09 DIAGNOSIS — F0781 Postconcussional syndrome: Secondary | ICD-10-CM

## 2020-11-09 NOTE — Telephone Encounter (Signed)
-----   Message from Ashley Jacobs sent at 11/09/2020  2:37 PM EDT ----- Regarding: referral Good afternoon!  Date             Time                         Type                                     Summary                                            11/09/2020 2:24 PM EDT             General   Sports Med sees patients within 6 weeks of head injury. This patient's injury is outside of this timeline. Recommend referring to Neuro for this patient.   User Clayborn Bigness R  Please advise and Thank you!

## 2020-11-09 NOTE — Telephone Encounter (Signed)
Neurology referral placed

## 2020-11-12 ENCOUNTER — Ambulatory Visit (INDEPENDENT_AMBULATORY_CARE_PROVIDER_SITE_OTHER): Payer: Medicare Other

## 2020-11-12 ENCOUNTER — Other Ambulatory Visit: Payer: Medicare Other

## 2020-11-12 ENCOUNTER — Other Ambulatory Visit: Payer: Self-pay

## 2020-11-12 DIAGNOSIS — S0990XD Unspecified injury of head, subsequent encounter: Secondary | ICD-10-CM

## 2020-11-12 DIAGNOSIS — R519 Headache, unspecified: Secondary | ICD-10-CM

## 2020-11-12 DIAGNOSIS — R222 Localized swelling, mass and lump, trunk: Secondary | ICD-10-CM | POA: Diagnosis not present

## 2020-11-12 DIAGNOSIS — R531 Weakness: Secondary | ICD-10-CM | POA: Diagnosis not present

## 2020-11-12 DIAGNOSIS — R221 Localized swelling, mass and lump, neck: Secondary | ICD-10-CM | POA: Diagnosis not present

## 2020-11-15 DIAGNOSIS — F411 Generalized anxiety disorder: Secondary | ICD-10-CM | POA: Diagnosis not present

## 2020-11-15 DIAGNOSIS — F410 Panic disorder [episodic paroxysmal anxiety] without agoraphobia: Secondary | ICD-10-CM | POA: Diagnosis not present

## 2020-11-15 DIAGNOSIS — F4310 Post-traumatic stress disorder, unspecified: Secondary | ICD-10-CM | POA: Diagnosis not present

## 2020-11-15 DIAGNOSIS — F3181 Bipolar II disorder: Secondary | ICD-10-CM | POA: Diagnosis not present

## 2020-11-16 ENCOUNTER — Telehealth: Payer: Self-pay | Admitting: Family Medicine

## 2020-11-16 NOTE — Telephone Encounter (Signed)
Copied from [place] [phone]. Topic: Medicare AWV >> Nov 16, 2020 10:38 AM Harris-Coley, Hannah Beat wrote: Reason for CRM: Left message for patient to reschedule Annual Wellness Visit.  Please schedule with Nurse Health Advisor Denisa O'Brien-Blaney, LPN at [facility].  Please call [phone] ask for [facility]

## 2020-11-17 ENCOUNTER — Telehealth: Payer: Self-pay | Admitting: Family Medicine

## 2020-11-17 NOTE — Telephone Encounter (Signed)
"  Rejection Reason - Patient was No Show" Michael [name] said on Nov 17, 2020 11:31 AM  Msg from [place] ent

## 2020-11-23 ENCOUNTER — Encounter: Payer: Self-pay | Admitting: Family Medicine

## 2020-11-24 NOTE — Telephone Encounter (Signed)
Letter placed up front  

## 2020-11-25 ENCOUNTER — Encounter: Payer: Self-pay | Admitting: Family Medicine

## 2020-11-25 DIAGNOSIS — L02415 Cutaneous abscess of right lower limb: Secondary | ICD-10-CM | POA: Diagnosis not present

## 2020-11-25 NOTE — Telephone Encounter (Signed)
He needs to be seen fairly quickly for this to see if it needs to be lanced.  I would suggest going to the [facility] walk-in clinic or to an urgent care for this as I am not in the office today and do not have any openings on Friday.

## 2020-11-29 ENCOUNTER — Ambulatory Visit: Payer: Medicare Other

## 2020-11-30 ENCOUNTER — Other Ambulatory Visit: Payer: Self-pay | Admitting: Family Medicine

## 2020-11-30 DIAGNOSIS — R531 Weakness: Secondary | ICD-10-CM

## 2020-12-06 ENCOUNTER — Emergency Department: Payer: Medicare Other

## 2020-12-06 ENCOUNTER — Encounter: Payer: Self-pay | Admitting: Emergency Medicine

## 2020-12-06 ENCOUNTER — Emergency Department
Admission: EM | Admit: 2020-12-06 | Discharge: 2020-12-06 | Disposition: A | Discharge status: Home / Self Care | Payer: Medicare Other | Attending: Emergency Medicine | Admitting: Emergency Medicine

## 2020-12-06 ENCOUNTER — Other Ambulatory Visit: Payer: Self-pay

## 2020-12-06 DIAGNOSIS — F410 Panic disorder [episodic paroxysmal anxiety] without agoraphobia: Secondary | ICD-10-CM | POA: Insufficient documentation

## 2020-12-06 DIAGNOSIS — Z8616 Personal history of COVID-19: Secondary | ICD-10-CM | POA: Diagnosis not present

## 2020-12-06 DIAGNOSIS — L02415 Cutaneous abscess of right lower limb: Secondary | ICD-10-CM | POA: Insufficient documentation

## 2020-12-06 DIAGNOSIS — R064 Hyperventilation: Secondary | ICD-10-CM

## 2020-12-06 DIAGNOSIS — R0682 Tachypnea, not elsewhere classified: Secondary | ICD-10-CM

## 2020-12-06 DIAGNOSIS — F17210 Nicotine dependence, cigarettes, uncomplicated: Secondary | ICD-10-CM | POA: Diagnosis not present

## 2020-12-06 DIAGNOSIS — Z79899 Other long term (current) drug therapy: Secondary | ICD-10-CM | POA: Diagnosis not present

## 2020-12-06 DIAGNOSIS — Z7951 Long term (current) use of inhaled steroids: Secondary | ICD-10-CM | POA: Insufficient documentation

## 2020-12-06 DIAGNOSIS — L0291 Cutaneous abscess, unspecified: Secondary | ICD-10-CM

## 2020-12-06 DIAGNOSIS — J4520 Mild intermittent asthma, uncomplicated: Secondary | ICD-10-CM | POA: Insufficient documentation

## 2020-12-06 DIAGNOSIS — I10 Essential (primary) hypertension: Secondary | ICD-10-CM | POA: Diagnosis not present

## 2020-12-06 LAB — CBC
HCT: 45.7 % (ref 39.0–52.0)
Hemoglobin: 16.7 g/dL (ref 13.0–17.0)
MCH: 33.6 pg (ref 26.0–34.0)
MCHC: 36.5 g/dL — ABNORMAL HIGH (ref 30.0–36.0)
MCV: 92 fL (ref 80.0–100.0)
Platelets: 286 10*3/uL (ref 150–400)
RBC: 4.97 MIL/uL (ref 4.22–5.81)
RDW: 12.8 % (ref 11.5–15.5)
WBC: 14.1 10*3/uL — ABNORMAL HIGH (ref 4.0–10.5)
nRBC: 0 % (ref 0.0–0.2)

## 2020-12-06 LAB — BASIC METABOLIC PANEL
Anion gap: 14 (ref 5–15)
BUN: 11 mg/dL (ref 6–20)
CO2: 21 mmol/L — ABNORMAL LOW (ref 22–32)
Calcium: 9.5 mg/dL (ref 8.9–10.3)
Chloride: 101 mmol/L (ref 98–111)
Creatinine, Ser: 0.96 mg/dL (ref 0.61–1.24)
GFR, Estimated: >60 mL/min (ref >60)
Glucose, Bld: 103 mg/dL — ABNORMAL HIGH (ref 70–99)
Potassium: 3.5 mmol/L (ref 3.5–5.1)
Sodium: 136 mmol/L (ref 135–145)

## 2020-12-06 LAB — TROPONIN I (HIGH SENSITIVITY): Troponin I (High Sensitivity): <2 ng/L (ref <18)

## 2020-12-06 MED ORDER — DIAZEPAM 2 MG PO TABS
2.0000 mg | ORAL_TABLET | Freq: Once | ORAL | Status: AC
  Administered 2020-12-06: 2 mg via ORAL
  Filled 2020-12-06: qty 1

## 2020-12-06 MED ORDER — LIDOCAINE-EPINEPHRINE-TETRACAINE (LET) TOPICAL GEL
3.0000 mL | Freq: Once | TOPICAL | Status: AC
  Administered 2020-12-06: 3 mL via TOPICAL
  Filled 2020-12-06: qty 3

## 2020-12-06 NOTE — ED Provider Notes (Addendum)
[facility] Emergency Department Provider Note   ____________________________________________    I have reviewed the triage vital signs and the nursing notes.   HISTORY  Chief Complaint Panic Attack     HPI Michael [name] is a 30 y.o. male who presents with complaints of an abscess on his inner thigh.  Apparently he went to go urgent care and they attempted to lance it however the patient then had a panic attack so he was referred here.  He complains of pain in the area x2 days.  No fevers or chills  Past Medical History:  Diagnosis Date   Allergy    Anal fissure 04/06/2017   Anxiety    Asthma    Chest pain off and on   COVID-19    02/2020   Depression    Family history of adverse reaction to anesthesia    adopted history unknown   GERD (gastroesophageal reflux disease)    Pancreatitis last 2015   Substance abuse ([place])    per pt heroin/cocaine age [age]     Patient Active Problem List   Diagnosis Date Noted   Head trauma 11/03/2020   Floaters 11/03/2020   Inflamed sebaceous cyst 07/23/2019   Multiple lipomas 07/14/2019   Boil of neck 07/14/2019   Chronic fatigue 07/14/2019   BMI 32.0-32.9,adult 07/14/2019   Abnormal thyroid stimulating hormone level 07/14/2019   Trochanteric bursitis of left hip 03/06/2018   CLE (columnar lined esophagus)    Ectopic gastric tissue    Heartburn    Generalized abdominal pain 08/09/2017   Hiatal hernia 08/09/2017   Mild intermittent asthma 08/09/2017   Nausea and vomiting 08/09/2017   Dysphagia    Benign esophageal stricture    GERD (gastroesophageal reflux disease) 07/20/2016   Cough 06/22/2016   Colon polyps 03/13/2016   Hypertension 01/26/2016   Chest pain 01/26/2016   Palpitations 01/26/2016   Anxiety 11/19/2015   Chronic diarrhea 11/19/2015   Syncope 11/19/2015   Bronchitis 10/26/2015   Hand injury 10/18/2015   Rectal pain 06/21/2015   Depressed 06/21/2015   Substance induced mood  disorder ([place]) 10/09/2014   Alcohol abuse 10/09/2014   Marijuana abuse 10/09/2014   Bipolar disorder ([place]) 10/09/2014   Psychosis ([place]) 06/03/2012    Past Surgical History:  Procedure Laterality Date   CHOLECYSTECTOMY  08/16/2015   Procedure: LAPAROSCOPIC CHOLECYSTECTOMY;  Surgeon: Alphonsa Overall, MD;  Location: [facility] ORS;  Service: General;;   ESOPHAGOGASTRODUODENOSCOPY (EGD) WITH PROPOFOL N/A 07/27/2016   Procedure: ESOPHAGOGASTRODUODENOSCOPY (EGD) WITH PROPOFOL;  Surgeon: Milus Banister, MD;  Location: [facility] END[facility]COPY;  Service: Endoscopy;  Laterality: N/A;   ESOPHAGOGASTRODUODENOSCOPY (EGD) WITH PROPOFOL N/A 10/10/2017   Procedure: ESOPHAGOGASTRODUODENOSCOPY (EGD) WITH PROPOFOL;  Surgeon: Virgel Manifold, MD;  Location: [facility] ENDOSCOPY;  Service: Endoscopy;  Laterality: N/A;   TONSILLECTOMY AND ADENOIDECTOMY  as child   and adenoids    Prior to Admission medications   Medication Sig Start Date End Date Taking? Authorizing Provider  albuterol (VENTOLIN HFA) 108 (90 Base) MCG/ACT inhaler Inhale 2 puffs into the lungs every 6 (six) hours as needed for wheezing or shortness of breath. 07/09/20   McLean-Scocuzza, Nino Glow, MD  buPROPion (WELLBUTRIN XL) 150 MG 24 hr tablet Take 1 tablet by mouth every morning. 07/02/20   [provider]  CAPLYTA 42 MG capsule SMARTSIG:1 Capsule(s) By Mouth Every Evening 10/29/20   [provider]  clonazePAM (KLONOPIN) 0.5 MG disintegrating tablet Take 0.5 mg by mouth 3 (three) times  daily as needed. 04/09/20   [provider]  doxycycline (VIBRA-TABS) 100 MG tablet Take 1 tablet (100 mg total) by mouth 2 (two) times daily. 11/03/20   [name], MD  DULoxetine (CYMBALTA) 20 MG capsule Take 20 mg by mouth 2 (two) times daily. 07/01/20   [provider]  eszopiclone (LUNESTA) 1 MG TABS tablet Take 1 mg by mouth at bedtime. 07/02/20   [provider]  famotidine-calcium carbonate-magnesium hydroxide (PEPCID COMPLETE)  10-800-165 MG chewable tablet Chew 1 tablet by mouth daily as needed.    [provider]  Fluticasone-Umeclidin-Vilant (TRELEGY ELLIPTA) 200-62.5-25 MCG/INH AEPB Inhale 1 puff into the lungs daily. Rinse mouth 07/09/20   McLean-Scocuzza, Nino Glow, MD  hydrOXYzine (ATARAX/VISTARIL) 50 MG tablet Take 50 mg by mouth at bedtime. Patient not taking: Reported on 11/03/2020    [provider]  omeprazole (PRILOSEC) 40 MG capsule TAKE 1 CAPSULE BY MOUTH ONCE DAILY 10/28/20   [name], MD  PARoxetine (PAXIL) 30 MG tablet Take 30 mg by mouth daily.    [provider]  prazosin (MINIPRESS) 5 MG capsule Take 5 mg by mouth at bedtime. 07/02/20   [provider]  propranolol ER (INDERAL LA) 60 MG 24 hr capsule SMARTSIG:1 Capsule(s) By Mouth Every Evening 09/27/20   [provider]  Skin Protectants, Misc. (CERAVE) OINT Apply 1 application topically daily as needed (itching).    [provider]  triamcinolone cream (KENALOG) 0.1 % Apply 1 application topically 2 (two) times daily as needed. Patient not taking: Reported on 11/03/2020 12/31/19   Brendolyn Patty, MD     Allergies Bee venom, Quetiapine, Serotonin reuptake inhibitors (ssris), Amoxicillin, Gabapentin, Prednisone, and Latuda [lurasidone hcl]  Family History  Adopted: Yes  Problem Relation Age of Onset   Diabetes Paternal Grandfather    Lung cancer Paternal Grandfather    Ulcers Father    Colon cancer Neg Hx     Social History Social History   Tobacco Use   Smoking status: Every Day    Packs/day: 1.50    Years: 15.00    Pack years: 22.50    Types: Cigarettes   Smokeless tobacco: Former    Types: Chew    Quit date: 08/12/2017   Tobacco comments:    1 pack per day--09/08/2019  Vaping Use   Vaping Use: Never used  Substance Use Topics   Alcohol use: Not Currently    Alcohol/week: 24.0 standard drinks    Types: 24 Cans of beer per week   Drug use: Yes    Types: Marijuana     Comment: heroin, cocaine, acid, pain killers; last use yrs ago, marijuana daily use 08/13/18     Review of Systems  Constitutional: No fever/chills    Gastrointestinal: No abdominal pain.  No nausea, no vomiting.    Skin: As above Neurological: Negative for headaches     ____________________________________________   PHYSICAL EXAM:  VITAL SIGNS: ED Triage Vitals  Enc Vitals Group     BP 12/06/20 1516 (!) 144/82     Pulse Rate 12/06/20 1516 100     Resp 12/06/20 1516 (!) 34     Temp --      Temp src --      SpO2 12/06/20 1516 100 %     Weight 12/06/20 1510 89.5 kg (197 lb 5 oz)     Height 12/06/20 1510 1.702 m (5\' 7" )     Head Circumference --      Peak  Flow --      Pain Score 12/06/20 1510 0     Pain Loc --      Pain Edu? --      Excl. in [place]? --      Constitutional: Alert and oriented. No acute distress. Pleasant and interactive Eyes: Conjunctivae are normal.  Head: Atraumatic. Nose: No congestion/rhinnorhea. Mouth/Throat: Mucous membranes are moist.   Cardiovascular: Normal rate, regular rhythm.  Respiratory: Normal respiratory effort.  No retractions. Genitourinary: deferred Musculoskeletal: No lower extremity tenderness nor edema.   Neurologic:  Normal speech and language. No gross focal neurologic deficits are appreciated.   Skin:  Skin is warm, dry and intact.  Small abscess noted on the inner right thigh, no surrounding cellulitis, not draining   ____________________________________________   LABS (all labs ordered are listed, but only abnormal results are displayed)  Labs Reviewed  BASIC METABOLIC PANEL - Abnormal; Notable for the following components:      Result Value   CO2 21 (*)    Glucose, Bld 103 (*)    All other components within normal limits  CBC - Abnormal; Notable for the following components:   WBC 14.1 (*)    MCHC 36.5 (*)    All other components within normal limits  TROPONIN I (HIGH SENSITIVITY)    ____________________________________________  EKG   ____________________________________________  RADIOLOGY   ____________________________________________   PROCEDURES  Procedure(s) performed: No  ..Incision and Drainage  Date/Time: 12/06/2020 6:30 PM Performed by: Lavonia Drafts, MD Authorized by: Lavonia Drafts, MD   Consent:    Consent obtained:  Verbal   Consent given by:  Patient   Risks discussed:  Incomplete drainage Location:    Type:  Abscess Pre-procedure details:    Skin preparation:  Antiseptic wash Sedation:    Sedation type:  None Anesthesia:    Anesthesia method:  None Procedure type:    Complexity:  Simple Procedure details:    Incision types:  Stab incision   Drainage:  Purulent   Wound treatment:  Wound left open Post-procedure details:    Procedure completion:  Tolerated   Critical Care performed: No ____________________________________________   INITIAL IMPRESSION / ASSESSMENT AND PLAN / ED COURSE  Pertinent labs & imaging results that were available during my care of the patient were reviewed by me and considered in my medical decision making (see chart for details).   Will give patient p.o. Valium, apply let and attempt I&D  Successful I&D, follow-up with PCP in 2 days for wound recheck.   ____________________________________________   FINAL CLINICAL IMPRESSION(S) / ED DIAGNOSES  Final diagnoses:  Abscess      NEW MEDICATIONS STARTED DURING THIS VISIT:  Discharge Medication List as of 12/06/2020  5:55 PM       Note:  This document was prepared using Dragon voice recognition software and may include unintentional dictation errors.    Lavonia Drafts, MD 12/06/20 Shirlee Limerick    Lavonia Drafts, MD 12/06/20 [phone]

## 2020-12-06 NOTE — ED Triage Notes (Signed)
Arrives from [facility]. Per report, patient presented to them to have an abscess drained, when placing the lidocaine, patient became anxious and hyperventilating.  Patient has history of same in the past.

## 2020-12-08 ENCOUNTER — Ambulatory Visit (INDEPENDENT_AMBULATORY_CARE_PROVIDER_SITE_OTHER): Payer: Medicare Other | Admitting: Family Medicine

## 2020-12-08 ENCOUNTER — Other Ambulatory Visit: Payer: Self-pay

## 2020-12-08 ENCOUNTER — Ambulatory Visit (INDEPENDENT_AMBULATORY_CARE_PROVIDER_SITE_OTHER): Payer: Medicare Other

## 2020-12-08 ENCOUNTER — Other Ambulatory Visit: Payer: Medicare Other

## 2020-12-08 DIAGNOSIS — L0291 Cutaneous abscess, unspecified: Secondary | ICD-10-CM

## 2020-12-08 DIAGNOSIS — Z9109 Other allergy status, other than to drugs and biological substances: Secondary | ICD-10-CM | POA: Diagnosis not present

## 2020-12-08 DIAGNOSIS — J320 Chronic maxillary sinusitis: Secondary | ICD-10-CM | POA: Diagnosis not present

## 2020-12-08 DIAGNOSIS — F17210 Nicotine dependence, cigarettes, uncomplicated: Secondary | ICD-10-CM | POA: Diagnosis not present

## 2020-12-08 DIAGNOSIS — R531 Weakness: Secondary | ICD-10-CM | POA: Diagnosis not present

## 2020-12-08 DIAGNOSIS — L02415 Cutaneous abscess of right lower limb: Secondary | ICD-10-CM | POA: Diagnosis not present

## 2020-12-08 DIAGNOSIS — N50812 Left testicular pain: Secondary | ICD-10-CM | POA: Diagnosis not present

## 2020-12-08 DIAGNOSIS — Z880 Allergy status to penicillin: Secondary | ICD-10-CM | POA: Diagnosis not present

## 2020-12-08 DIAGNOSIS — F3132 Bipolar disorder, current episode depressed, moderate: Secondary | ICD-10-CM | POA: Diagnosis not present

## 2020-12-08 DIAGNOSIS — N50811 Right testicular pain: Secondary | ICD-10-CM | POA: Diagnosis not present

## 2020-12-08 DIAGNOSIS — K219 Gastro-esophageal reflux disease without esophagitis: Secondary | ICD-10-CM | POA: Diagnosis not present

## 2020-12-08 DIAGNOSIS — R6883 Chills (without fever): Secondary | ICD-10-CM | POA: Diagnosis not present

## 2020-12-08 DIAGNOSIS — L03115 Cellulitis of right lower limb: Secondary | ICD-10-CM | POA: Diagnosis not present

## 2020-12-08 NOTE — Progress Notes (Addendum)
Tommi Rumps, MD Phone: [phone]  Michael [name] is a 30 y.o. male who presents today for same-day visit.  Right thigh abscess: Patient had an I&D on 12/06/2020.  This was completed in the emergency department.  He was not placed on antibiotics were given pain medication.  Since then the pain in the area has worsened.  There has been spreading redness.  There has been no drainage.  He rates his pain as a 10 out of 10.  Social History   Tobacco Use  Smoking Status Every Day   Packs/day: 1.50   Years: 15.00   Pack years: 22.50   Types: Cigarettes  Smokeless Tobacco Former   Types: Chew   Quit date: 08/12/2017  Tobacco Comments   1 pack per day--09/08/2019    Current Outpatient Medications on File Prior to Visit  Medication Sig Dispense Refill   albuterol (VENTOLIN HFA) 108 (90 Base) MCG/ACT inhaler Inhale 2 puffs into the lungs every 6 (six) hours as needed for wheezing or shortness of breath. 18 g 11   buPROPion (WELLBUTRIN XL) 150 MG 24 hr tablet Take 1 tablet by mouth every morning.     CAPLYTA 42 MG capsule SMARTSIG:1 Capsule(s) By Mouth Every Evening     clonazePAM (KLONOPIN) 0.5 MG disintegrating tablet Take 0.5 mg by mouth 3 (three) times daily as needed.     doxycycline (VIBRA-TABS) 100 MG tablet Take 1 tablet (100 mg total) by mouth 2 (two) times daily. 14 tablet 0   DULoxetine (CYMBALTA) 20 MG capsule Take 20 mg by mouth 2 (two) times daily.     eszopiclone (LUNESTA) 1 MG TABS tablet Take 1 mg by mouth at bedtime.     famotidine-calcium carbonate-magnesium hydroxide (PEPCID COMPLETE) 10-800-165 MG chewable tablet Chew 1 tablet by mouth daily as needed.     Fluticasone-Umeclidin-Vilant (TRELEGY ELLIPTA) 200-62.5-25 MCG/INH AEPB Inhale 1 puff into the lungs daily. Rinse mouth 60 each 11   hydrOXYzine (ATARAX/VISTARIL) 50 MG tablet Take 50 mg by mouth at bedtime. (Patient not taking: Reported on 11/03/2020)     omeprazole (PRILOSEC) 40 MG capsule TAKE 1 CAPSULE BY MOUTH  ONCE DAILY 30 capsule 2   PARoxetine (PAXIL) 30 MG tablet Take 30 mg by mouth daily.     prazosin (MINIPRESS) 5 MG capsule Take 5 mg by mouth at bedtime.     propranolol ER (INDERAL LA) 60 MG 24 hr capsule SMARTSIG:1 Capsule(s) By Mouth Every Evening     Skin Protectants, Misc. (CERAVE) OINT Apply 1 application topically daily as needed (itching).     triamcinolone cream (KENALOG) 0.1 % Apply 1 application topically 2 (two) times daily as needed. (Patient not taking: Reported on 11/03/2020) 80 g 2   No current facility-administered medications on file prior to visit.     ROS see history of present illness  Objective  Physical Exam Vitals:   12/08/20 1213  BP: (!) 128/100  Pulse: 90  Resp: 20  Temp: 98 F (36.7 C)  SpO2: 97%    BP Readings from Last 3 Encounters:  12/08/20 (!) 128/100  12/06/20 (!) 144/82  11/03/20 130/80   Wt Readings from Last 3 Encounters:  12/06/20 197 lb 5 oz (89.5 kg)  11/03/20 197 lb 6.4 oz (89.5 kg)  07/09/20 190 lb (86.2 kg)    Physical Exam Constitutional:      Comments: The patient is exquisitely uncomfortable when trying to move for [place] to do our exam, he is in tears intermittently  Skin:  Comments: There is a firm area within the right inner proximal thigh with surrounding erythema spreading towards his scrotum, the area is tender and warm, there is no drainage, there is no fluctuance  Neurological:     Mental Status: He is alert.     Assessment/Plan: Please see individual problem list.  Problem List Items Addressed This Visit     Abscess    Given his degree of pain I do not feel as though there is much I am going to be able to do for him here in the office.  I think he needs evaluation in the emergency department for pain control as well as antibiotics.  He likely will need at least a dose of IV antibiotics given the spreading redness following I&D.  He will certainly need oral antibiotics moving forward.  His parents will be contacted  to drive him as I do not think it is safe for him to drive himself given that he recently took a Valium and given his significant pain would interfere with his ability to operate a motor vehicle safely.      Bipolar disorder [facility])    Patient requests referral to a new psychiatrist.  This will be placed.      Relevant Orders   Ambulatory referral to Psychiatry   The patient's mother came to the office to pick him up.  I discussed my concerns with her.  She also noted that they would like a referral to a different psychiatrist.  They feel as though he is not getting appropriate care through his current psychiatrist.  They requested a referral to [facility] psychiatry.  This will be placed.  No follow-ups on file.  This visit occurred during the SARS-CoV-2 public health emergency.  Safety protocols were in place, including screening questions prior to the visit, additional usage of staff PPE, and extensive cleaning of exam room while observing appropriate contact time as indicated for disinfecting solutions.    Tommi Rumps, MD [place]

## 2020-12-08 NOTE — Assessment & Plan Note (Signed)
Patient requests referral to a new psychiatrist.  This will be placed.

## 2020-12-08 NOTE — Progress Notes (Signed)
Patient came in for X-ray with complaint of right groin pain rated at 10 from a lacerated boil on 12/06/20 . Stated area was starting to turn darker ed and growing size warm to touch. Took vitals and advised PCP. Patient advised nurse he felt he was going to have a panic attack , patient took 10 mg valium prescribed to hi at 11:30 am .

## 2020-12-08 NOTE — Assessment & Plan Note (Signed)
Given his degree of pain I do not feel as though there is much I am going to be able to do for him here in the office.  I think he needs evaluation in the emergency department for pain control as well as antibiotics.  He likely will need at least a dose of IV antibiotics given the spreading redness following I&D.  He will certainly need oral antibiotics moving forward.  His parents will be contacted to drive him as I do not think it is safe for him to drive himself given that he recently took a Valium and given his significant pain would interfere with his ability to operate a motor vehicle safely.

## 2020-12-08 NOTE — Addendum Note (Signed)
Addended by: [name] on: 12/08/2020 12:37 PM   Modules accepted: Orders

## 2020-12-09 ENCOUNTER — Telehealth: Payer: Self-pay | Admitting: Family Medicine

## 2020-12-09 DIAGNOSIS — F3132 Bipolar disorder, current episode depressed, moderate: Secondary | ICD-10-CM

## 2020-12-09 NOTE — Telephone Encounter (Signed)
I called and spoke with the patient and he stated he would like to try [facility].  Michael [name],cma

## 2020-12-09 NOTE — Telephone Encounter (Signed)
Please let the patient know that [facility] is not excepting any outside referrals at this time.  I am happy to refer him to a different local psychiatrist or we could try [facility] psychiatry.

## 2020-12-10 NOTE — Addendum Note (Signed)
Addended by: Caryl Bis, Edla Para G on: 12/10/2020 11:19 AM   Modules accepted: Orders

## 2020-12-10 NOTE — Telephone Encounter (Signed)
Referral to [facility] psychiatry placed.

## 2020-12-13 ENCOUNTER — Encounter: Payer: Self-pay | Admitting: Family Medicine

## 2020-12-14 NOTE — Telephone Encounter (Signed)
There was a cancellation for tomorrow at 1:15 pm and I called and scheduled the patient to be seen for his pain in the scrotum area, he wanted the provider to know that [facility] did do a US of the scrotum and found an abnormality and he wanted to give you a heads up to look at the information from [facility].  Viral Schramm,cma

## 2020-12-14 NOTE — Telephone Encounter (Signed)
Noted. Plan to evaluate tomorrow as scheduled.

## 2020-12-15 ENCOUNTER — Other Ambulatory Visit: Payer: Self-pay

## 2020-12-15 ENCOUNTER — Encounter: Payer: Self-pay | Admitting: Family Medicine

## 2020-12-15 ENCOUNTER — Ambulatory Visit (INDEPENDENT_AMBULATORY_CARE_PROVIDER_SITE_OTHER): Payer: Medicare Other | Admitting: Family Medicine

## 2020-12-15 VITALS — BP 130/78 | HR 90 | Temp 96.8°F | Ht 67.01 in | Wt 193.4 lb

## 2020-12-15 DIAGNOSIS — L0291 Cutaneous abscess, unspecified: Secondary | ICD-10-CM

## 2020-12-15 DIAGNOSIS — N50812 Left testicular pain: Secondary | ICD-10-CM

## 2020-12-15 DIAGNOSIS — F3132 Bipolar disorder, current episode depressed, moderate: Secondary | ICD-10-CM | POA: Diagnosis not present

## 2020-12-15 DIAGNOSIS — N50811 Right testicular pain: Secondary | ICD-10-CM | POA: Diagnosis not present

## 2020-12-15 MED ORDER — NAPROXEN 500 MG PO TABS: 500.0000 mg | ORAL_TABLET | Freq: Two times a day (BID) | ORAL | 0 refills | Status: DC | PRN

## 2020-12-15 NOTE — Assessment & Plan Note (Addendum)
Discussed that he should hear from psychiatry soon.  If he does not hear from them by the end of the week he will let us know.  He does need to follow-up with a new psychiatrist.  He was advised to seek medical attention in the emergency department if he develops suicidal ideation.

## 2020-12-15 NOTE — Assessment & Plan Note (Signed)
This is significantly improved.  He will complete his course of antibiotics.

## 2020-12-15 NOTE — Assessment & Plan Note (Signed)
This continues to be an issue.  Discussed that the causes currently undetermined.  Advised this could be related to the infection he had adjacent to his testicles and scrotum though could also be related to the possible hernia.  He had a negative gonorrhea and chlamydia test and has not been sexually active.  He had a negative urinalysis.  His ultrasound did not reveal any torsion.  Given the degree of his discomfort we are going to send him to urology for further evaluation.  If he does not hear from them by sometime next week he will let us know.  We will trial Naprosyn to help with his discomfort.  He was advised not to take any over-the-counter NSAIDs with this.

## 2020-12-15 NOTE — Patient Instructions (Signed)
Nice to see you. We will try Naprosyn to see if that will help with your discomfort.  If it is not beneficial please let me know. If you do not hear from urology by early next week please let me know.  If your testicle discomfort worsens significantly please get evaluated. If you do not hear from psychiatry by later this week please let me know.

## 2020-12-15 NOTE — Progress Notes (Signed)
Tommi Rumps, MD Phone: [phone]  Michael [name] is a 30 y.o. male who presents today for f/u.  Cellulitis/abscess/testicular pain: The patient was seen on 12/08/2020 for an abscess/cellulitis in his right inner thigh area.  He developed pain into his scrotum.  He was sent to the emergency room for evaluation.  He had an ultrasound that revealed epididymoorchitis and also a potential fat hernia versus funiculitis.  He had no testicular lesions.  There is no testicular torsion.  He did have a negative gonorrhea and chlamydia test while in the ED.  He has continued to have testicle discomfort that he describes as an aching discomfort.  He had some of that prior to getting the infection in his thigh area.  He has had no fevers.  He notes the infection has been improving though the testicular discomfort has been stable.  He has tried Advil, Tylenol, and Excedrin.  He reports having smoked marijuana as well to try to help with the discomfort.  Bipolar disorder: Patient reports he is trying to see a new psychiatrist.  He thinks he is getting addicted to the Valium and wants to detox off of this.  He notes no SI.  He feels as though he needs a new psychiatrist to help him through his psychiatric issues.  Social History   Tobacco Use  Smoking Status Every Day   Packs/day: 1.50   Years: 15.00   Pack years: 22.50   Types: Cigarettes  Smokeless Tobacco Former   Types: Chew   Quit date: 08/12/2017  Tobacco Comments   1 pack per day--09/08/2019    Current Outpatient Medications on File Prior to Visit  Medication Sig Dispense Refill   albuterol (VENTOLIN HFA) 108 (90 Base) MCG/ACT inhaler Inhale 2 puffs into the lungs every 6 (six) hours as needed for wheezing or shortness of breath. 18 g 11   amitriptyline (ELAVIL) 10 MG tablet      ARIPiprazole (ABILIFY) 10 MG tablet Take 10 mg by mouth every morning.     buPROPion (WELLBUTRIN XL) 150 MG 24 hr tablet Take 1 tablet by mouth every morning.      buPROPion (WELLBUTRIN XL) 150 MG 24 hr tablet Take by mouth.     CAPLYTA 42 MG capsule SMARTSIG:1 Capsule(s) By Mouth Every Evening     diazepam (VALIUM) 10 MG tablet      DULoxetine (CYMBALTA) 20 MG capsule Take 20 mg by mouth 2 (two) times daily.     eszopiclone (LUNESTA) 1 MG TABS tablet Take 1 mg by mouth at bedtime.     famotidine-calcium carbonate-magnesium hydroxide (PEPCID COMPLETE) 10-800-165 MG chewable tablet Chew 1 tablet by mouth daily as needed.     Fluticasone-Umeclidin-Vilant (TRELEGY ELLIPTA) 200-62.5-25 MCG/INH AEPB Inhale 1 puff into the lungs daily. Rinse mouth 60 each 11   omeprazole (PRILOSEC) 40 MG capsule TAKE 1 CAPSULE BY MOUTH ONCE DAILY 30 capsule 2   PARoxetine (PAXIL) 30 MG tablet Take 30 mg by mouth daily.     prazosin (MINIPRESS) 5 MG capsule Take 5 mg by mouth at bedtime.     propranolol ER (INDERAL LA) 60 MG 24 hr capsule SMARTSIG:1 Capsule(s) By Mouth Every Evening     Skin Protectants, Misc. (CERAVE) OINT Apply 1 application topically daily as needed (itching).     triamcinolone cream (KENALOG) 0.1 % Apply 1 application topically 2 (two) times daily as needed. 80 g 2   clonazePAM (KLONOPIN) 0.5 MG disintegrating tablet Take 0.5 mg by mouth 3 (three)  times daily as needed. (Patient not taking: Reported on 12/15/2020)     doxycycline (VIBRA-TABS) 100 MG tablet Take 1 tablet (100 mg total) by mouth 2 (two) times daily. (Patient not taking: Reported on 12/15/2020) 14 tablet 0   hydrOXYzine (ATARAX/VISTARIL) 50 MG tablet Take 50 mg by mouth at bedtime. (Patient not taking: No sig reported)     No current facility-administered medications on file prior to visit.     ROS see history of present illness  Objective  Physical Exam Vitals:   12/15/20 1327  BP: 130/78  Pulse: 90  Temp: (!) 96.8 F (36 C)  SpO2: 97%    BP Readings from Last 3 Encounters:  12/15/20 130/78  12/08/20 (!) 128/100  12/06/20 (!) 144/82   Wt Readings from Last 3 Encounters:   12/15/20 193 lb 6.4 oz (87.7 kg)  12/06/20 197 lb 5 oz (89.5 kg)  11/03/20 197 lb 6.4 oz (89.5 kg)    Physical Exam Genitourinary:    Comments: Scrotum appears normal, he has discomfort on even mild palpation of his epididymal area on the right and left, mild discomfort on palpation of the testicles bilaterally, testicles are oriented vertically, he was unable to tolerate any additional testicular exam his right inner thigh is without signs of infection, the area of the prior abscess has improved    Assessment/Plan: Please see individual problem list.  Problem List Items Addressed This Visit     Abscess    This is significantly improved.  He will complete his course of antibiotics.      Bipolar disorder [facility])    Discussed that he should hear from psychiatry soon.  If he does not hear from them by the end of the week he will let us know.  He does need to follow-up with a new psychiatrist.  He was advised to seek medical attention in the emergency department if he develops suicidal ideation.      Pain in both testicles - Primary    This continues to be an issue.  Discussed that the causes currently undetermined.  Advised this could be related to the infection he had adjacent to his testicles and scrotum though could also be related to the possible hernia.  He had a negative gonorrhea and chlamydia test and has not been sexually active.  He had a negative urinalysis.  His ultrasound did not reveal any torsion.  Given the degree of his discomfort we are going to send him to urology for further evaluation.  If he does not hear from them by sometime next week he will let us know.  We will trial Naprosyn to help with his discomfort.  He was advised not to take any over-the-counter NSAIDs with this.      Relevant Medications   naproxen (NAPROSYN) 500 MG tablet   Other Relevant Orders   Ambulatory referral to Urology    Return in about 4 weeks (around 01/12/2021).  This visit occurred  during the SARS-CoV-2 public health emergency.  Safety protocols were in place, including screening questions prior to the visit, additional usage of staff PPE, and extensive cleaning of exam room while observing appropriate contact time as indicated for disinfecting solutions.   I have spent 33 minutes in the care of this patient regarding History taking, documentation, discussion of plan, completion of exam, review of his recent ED visit.   Tommi Rumps, MD [place]

## 2020-12-16 ENCOUNTER — Other Ambulatory Visit: Payer: Self-pay | Admitting: Family Medicine

## 2020-12-16 DIAGNOSIS — R531 Weakness: Secondary | ICD-10-CM

## 2020-12-20 ENCOUNTER — Ambulatory Visit (INDEPENDENT_AMBULATORY_CARE_PROVIDER_SITE_OTHER): Payer: Medicare Other | Admitting: Urology

## 2020-12-20 ENCOUNTER — Other Ambulatory Visit: Payer: Self-pay

## 2020-12-20 ENCOUNTER — Encounter: Payer: Self-pay | Admitting: Urology

## 2020-12-20 VITALS — BP 123/79 | HR 90 | Ht 67.0 in | Wt 193.0 lb

## 2020-12-20 DIAGNOSIS — N5082 Scrotal pain: Secondary | ICD-10-CM

## 2020-12-20 DIAGNOSIS — N50819 Testicular pain, unspecified: Secondary | ICD-10-CM | POA: Diagnosis not present

## 2020-12-20 DIAGNOSIS — R9389 Abnormal findings on diagnostic imaging of other specified body structures: Secondary | ICD-10-CM | POA: Diagnosis not present

## 2020-12-20 MED ORDER — TRAMADOL HCL 50 MG PO TABS: 50.0000 mg | ORAL_TABLET | Freq: Four times a day (QID) | ORAL | 0 refills | Status: DC | PRN

## 2020-12-20 NOTE — Progress Notes (Signed)
12/20/2020 9:09 AM   [place] P Lingard 08-21-1990 [id number]  Referring provider: [name], MD [address] [place],  [place] [address]  Chief Complaint  Patient presents with   Testicle Pain    HPI: [place] Simmering is a 30 y.o. male referred for evaluation of scrotal pain.  ED visit [facility] 12/06/2020 with a right thigh abscess who underwent I&D PCP follow-up 2 days later complaining of increased pain and today have increased erythema and extending towards the scrotum.  ED visit was recommended Seen in ED [facility] diagnosed with cellulitis and started on doxycycline Scrotal sonogram was performed which showed mild increased vascularity of the right testis/epididymis.  There was also prominent hyperemic fat in the right inguinal canal felt to represent either a hernia or inflammatory changes of the spermatic cord PCP follow-up 12/15/2020 complaining of bilateral scrotal pain.  No significant abnormalities were noted on exam other than tenderness Taking Naprosyn for the pain which is not helping   PMH: Past Medical History:  Diagnosis Date   Allergy    Anal fissure 04/06/2017   Anxiety    Asthma    Chest pain off and on   COVID-19    02/2020   Depression    Family history of adverse reaction to anesthesia    adopted history unknown   GERD (gastroesophageal reflux disease)    Pancreatitis last 2015   Substance abuse ([place])    per pt heroin/cocaine age [age]     Surgical History: Past Surgical History:  Procedure Laterality Date   CHOLECYSTECTOMY  08/16/2015   Procedure: LAPAROSCOPIC CHOLECYSTECTOMY;  Surgeon: Alphonsa Overall, MD;  Location: [facility];  Service: General;;   ESOPHAGOGASTRODUODENOSCOPY (EGD) WITH PROPOFOL N/A 07/27/2016   Procedure: ESOPHAGOGASTRODUODENOSCOPY (EGD) WITH PROPOFOL;  Surgeon: Milus Banister, MD;  Location: [facility];  Service: Endoscopy;  Laterality: N/A;   ESOPHAGOGASTRODUODENOSCOPY (EGD) WITH PROPOFOL N/A 10/10/2017    Procedure: ESOPHAGOGASTRODUODENOSCOPY (EGD) WITH PROPOFOL;  Surgeon: Virgel Manifold, MD;  Location: [facility] ENDOSCOPY;  Service: Endoscopy;  Laterality: N/A;   TONSILLECTOMY AND ADENOIDECTOMY  as child   and adenoids    Home Medications:  Allergies as of 12/20/2020       Reactions   Bee Venom Anaphylaxis   Quetiapine Rash   Serotonin Reuptake Inhibitors (ssris) Rash   Amoxicillin Diarrhea   Has patient had a PCN reaction causing immediate rash, facial/tongue/throat swelling, SOB or lightheadedness with hypotension: No Has patient had a PCN reaction causing severe rash involving mucus membranes or skin necrosis: No Has patient had a PCN reaction that required hospitalization: No Has patient had a PCN reaction occurring within the last 10 years: Yes If all of the above answers are "NO", then may proceed with Cephalosporin use.   Gabapentin Nausea And Vomiting   diarrhea   Prednisone Other (See Comments)   Difficulty breathing   Latuda [lurasidone Hcl] Rash, Nausea And Vomiting        Medication List        Accurate as of December 20, 2020  9:09 AM. If you have any questions, ask your nurse or doctor.          albuterol 108 (90 Base) MCG/ACT inhaler Commonly known as: VENTOLIN HFA Inhale 2 puffs into the lungs every 6 (six) hours as needed for wheezing or shortness of breath.   amitriptyline 10 MG tablet Commonly known as: ELAVIL   ARIPiprazole 10 MG tablet Commonly known as: ABILIFY Take 10 mg by mouth every  morning.   buPROPion 150 MG 24 hr tablet Commonly known as: WELLBUTRIN XL Take 1 tablet by mouth every morning.   buPROPion 150 MG 24 hr tablet Commonly known as: WELLBUTRIN XL Take by mouth.   Caplyta 42 MG capsule Generic drug: lumateperone tosylate SMARTSIG:1 Capsule(s) By Mouth Every Evening   CeraVe Oint Apply 1 application topically daily as needed (itching).   clonazePAM 0.5 MG disintegrating tablet Commonly known as: KLONOPIN Take 0.5 mg by  mouth 3 (three) times daily as needed.   diazepam 10 MG tablet Commonly known as: VALIUM   doxycycline 100 MG tablet Commonly known as: VIBRA-TABS Take 1 tablet (100 mg total) by mouth 2 (two) times daily.   DULoxetine 20 MG capsule Commonly known as: CYMBALTA Take 20 mg by mouth 2 (two) times daily.   eszopiclone 1 MG Tabs tablet Commonly known as: LUNESTA Take 1 mg by mouth at bedtime.   famotidine-calcium carbonate-magnesium hydroxide 10-800-165 MG chewable tablet Commonly known as: PEPCID COMPLETE Chew 1 tablet by mouth daily as needed.   hydrOXYzine 50 MG tablet Commonly known as: ATARAX/VISTARIL Take 50 mg by mouth at bedtime.   naproxen 500 MG tablet Commonly known as: Naprosyn Take 1 tablet (500 mg total) by mouth 2 (two) times daily as needed for mild pain. Take with food.   omeprazole 40 MG capsule Commonly known as: PRILOSEC TAKE 1 CAPSULE BY MOUTH ONCE DAILY   PARoxetine 30 MG tablet Commonly known as: PAXIL Take 30 mg by mouth daily.   prazosin 5 MG capsule Commonly known as: MINIPRESS Take 5 mg by mouth at bedtime.   propranolol ER 60 MG 24 hr capsule Commonly known as: INDERAL LA SMARTSIG:1 Capsule(s) By Mouth Every Evening   Trelegy Ellipta 200-62.5-25 MCG/ACT Aepb Generic drug: Fluticasone-Umeclidin-Vilant Inhale 1 puff into the lungs daily. Rinse mouth   triamcinolone cream 0.1 % Commonly known as: KENALOG Apply 1 application topically 2 (two) times daily as needed.        Allergies:  Allergies  Allergen Reactions   Bee Venom Anaphylaxis   Quetiapine Rash   Serotonin Reuptake Inhibitors (Ssris) Rash   Amoxicillin Diarrhea    Has patient had a PCN reaction causing immediate rash, facial/tongue/throat swelling, SOB or lightheadedness with hypotension: No Has patient had a PCN reaction causing severe rash involving mucus membranes or skin necrosis: No Has patient had a PCN reaction that required hospitalization: No Has patient had a  PCN reaction occurring within the last 10 years: Yes If all of the above answers are "NO", then may proceed with Cephalosporin use.    Gabapentin Nausea And Vomiting    diarrhea   Prednisone Other (See Comments)    Difficulty breathing   Latuda [Lurasidone Hcl] Rash and Nausea And Vomiting    Family History: Family History  Adopted: Yes  Problem Relation Age of Onset   Diabetes Paternal Grandfather    Lung cancer Paternal Grandfather    Ulcers Father    Colon cancer Neg Hx     Social History:  reports that he has been smoking cigarettes. He has a 22.50 pack-year smoking history. He quit smokeless tobacco use about 3 years ago.  His smokeless tobacco use included chew. He reports that he does not currently use alcohol after a past usage of about 24.0 standard drinks per week. He reports current drug use. Drug: Marijuana.   Physical Exam: BP 123/79   Pulse 90   Ht 5\' 7"  (1.702 m)   Wt 193 lb (  87.5 kg)   BMI 30.23 kg/m   Constitutional:  Alert and oriented, No acute distress. HEENT: [place] AT, moist mucus membranes.  Trachea midline, no masses. Cardiovascular: No clubbing, cyanosis, or edema. Respiratory: Normal respiratory effort, no increased work of breathing. GI: Abdomen is soft, nontender, nondistended, no abdominal masses GU: Phallus without lesions.  Testes descended bilaterally.  Scrotal skin normal in appearance.  Mark tenderness testes/spermatic cord R >L. No abnormalities appreciated but difficult to obtain an adequate exam due to tenderness Psychiatric: Normal mood and affect.  Laboratory Data:  Urinalysis Dipstick/microscopy negative  Assessment & Plan:    1.  Bilateral scrotal pain Adequate exam not obtainable due to tenderness.  No definite abnormalities appreciated I do not have an etiology to explain his bilateral scrotal pain Schedule CT pelvis to evaluate for right inguinal findings on ultrasound Continue Naprosyn Limited Rx tramadol   Abbie Sons, MD  [facility] [address] [place], [place] [address] [phone]

## 2020-12-21 ENCOUNTER — Ambulatory Visit
Admission: RE | Admit: 2020-12-21 | Discharge: 2020-12-21 | Disposition: A | Discharge status: Home / Self Care | Payer: Medicare Other | Source: Ambulatory Visit | Attending: Urology | Admitting: Urology

## 2020-12-21 ENCOUNTER — Other Ambulatory Visit: Payer: Self-pay

## 2020-12-21 DIAGNOSIS — R9389 Abnormal findings on diagnostic imaging of other specified body structures: Secondary | ICD-10-CM | POA: Insufficient documentation

## 2020-12-21 DIAGNOSIS — S3993XA Unspecified injury of pelvis, initial encounter: Secondary | ICD-10-CM | POA: Diagnosis not present

## 2020-12-21 DIAGNOSIS — N5082 Scrotal pain: Secondary | ICD-10-CM | POA: Diagnosis not present

## 2020-12-21 MED ORDER — IOHEXOL 350 MG/ML SOLN
100.0000 mL | Freq: Once | INTRAVENOUS | Status: AC | PRN
  Administered 2020-12-21: 100 mL via INTRAVENOUS

## 2020-12-22 LAB — URINALYSIS, COMPLETE
Bilirubin, UA: NEGATIVE
Glucose, UA: NEGATIVE
Ketones, UA: NEGATIVE
Leukocytes,UA: NEGATIVE
Nitrite, UA: NEGATIVE
Protein,UA: NEGATIVE
RBC, UA: NEGATIVE
Specific Gravity, UA: 1.015 (ref 1.005–1.030)
Urobilinogen, Ur: 0.2 mg/dL (ref 0.2–1.0)
pH, UA: 6 (ref 5.0–7.5)

## 2020-12-22 LAB — MICROSCOPIC EXAMINATION
Bacteria, UA: NONE SEEN
Epithelial Cells (non renal): NONE SEEN /hpf (ref 0–10)
RBC, Urine: NONE SEEN /hpf (ref 0–2)

## 2020-12-23 ENCOUNTER — Other Ambulatory Visit: Payer: Self-pay

## 2020-12-23 ENCOUNTER — Ambulatory Visit
Admission: RE | Admit: 2020-12-23 | Discharge: 2020-12-23 | Disposition: A | Discharge status: Home / Self Care | Payer: Medicare Other | Source: Ambulatory Visit | Attending: Family Medicine | Admitting: Family Medicine

## 2020-12-23 DIAGNOSIS — R531 Weakness: Secondary | ICD-10-CM | POA: Diagnosis not present

## 2020-12-24 ENCOUNTER — Encounter: Payer: Self-pay | Admitting: *Deleted

## 2020-12-28 ENCOUNTER — Encounter: Payer: Self-pay | Admitting: Family Medicine

## 2020-12-29 ENCOUNTER — Encounter: Payer: Self-pay | Admitting: Family Medicine

## 2020-12-29 ENCOUNTER — Other Ambulatory Visit: Payer: Self-pay | Admitting: Family Medicine

## 2020-12-29 DIAGNOSIS — R531 Weakness: Secondary | ICD-10-CM

## 2020-12-29 DIAGNOSIS — F3132 Bipolar disorder, current episode depressed, moderate: Secondary | ICD-10-CM

## 2021-01-03 ENCOUNTER — Encounter: Payer: Self-pay | Admitting: Family Medicine

## 2021-01-10 ENCOUNTER — Encounter: Payer: Self-pay | Admitting: Neurology

## 2021-01-10 DIAGNOSIS — F410 Panic disorder [episodic paroxysmal anxiety] without agoraphobia: Secondary | ICD-10-CM | POA: Diagnosis not present

## 2021-01-10 DIAGNOSIS — F411 Generalized anxiety disorder: Secondary | ICD-10-CM | POA: Diagnosis not present

## 2021-01-10 DIAGNOSIS — F4310 Post-traumatic stress disorder, unspecified: Secondary | ICD-10-CM | POA: Diagnosis not present

## 2021-01-10 DIAGNOSIS — F3181 Bipolar II disorder: Secondary | ICD-10-CM | POA: Diagnosis not present

## 2021-01-14 ENCOUNTER — Other Ambulatory Visit: Payer: Self-pay | Admitting: Family Medicine

## 2021-01-19 ENCOUNTER — Encounter: Payer: Self-pay | Admitting: Family Medicine

## 2021-01-19 NOTE — Telephone Encounter (Signed)
Patient sent to access nurse to be triaged.

## 2021-01-20 ENCOUNTER — Telehealth: Payer: Self-pay

## 2021-01-20 NOTE — Telephone Encounter (Signed)
Received a fax from Access nurse, it was not on the S drive and I called and triaged the patient, he did not go to the hospital he stated the pain is a little better but he is having pain in the groin area and I scheduled him to see the provider tomorrow.  Lum Stillinger,cma

## 2021-01-21 ENCOUNTER — Ambulatory Visit (INDEPENDENT_AMBULATORY_CARE_PROVIDER_SITE_OTHER): Payer: Medicare Other | Admitting: Family Medicine

## 2021-01-21 ENCOUNTER — Encounter: Payer: Self-pay | Admitting: Family Medicine

## 2021-01-21 ENCOUNTER — Ambulatory Visit
Admission: RE | Admit: 2021-01-21 | Discharge: 2021-01-21 | Disposition: A | Discharge status: Home / Self Care | Payer: Medicare Other | Source: Ambulatory Visit | Attending: Family Medicine | Admitting: Family Medicine

## 2021-01-21 ENCOUNTER — Other Ambulatory Visit: Payer: Self-pay

## 2021-01-21 DIAGNOSIS — Z87438 Personal history of other diseases of male genital organs: Secondary | ICD-10-CM | POA: Diagnosis not present

## 2021-01-21 DIAGNOSIS — M545 Low back pain, unspecified: Secondary | ICD-10-CM | POA: Diagnosis not present

## 2021-01-21 DIAGNOSIS — R1031 Right lower quadrant pain: Secondary | ICD-10-CM | POA: Insufficient documentation

## 2021-01-21 DIAGNOSIS — R109 Unspecified abdominal pain: Secondary | ICD-10-CM | POA: Insufficient documentation

## 2021-01-21 NOTE — Assessment & Plan Note (Signed)
The patient has symptoms that seem most consistent with a likely kidney stone.  Certainly given the discomfort in his right lower quadrant this could represent an appendicitis or other intraabdominal process though the lack of tenderness when he presses on his abdomen would seem to argue against that.  Discussed the need for a CT scan to evaluate for a kidney stone.  CT scan without contrast would also give [place] some idea if there are findings of inflammation in the right lower quadrant.  We will attempt to get this completed today.  The patient was advised to go to the emergency department if you develop severe discomfort, fever, blood in his urine, or any worsening symptoms.

## 2021-01-21 NOTE — Progress Notes (Signed)
Virtual Visit via telephone Note  This visit type was conducted due to national recommendations for restrictions regarding the COVID-19 pandemic (e.g. social distancing).  This format is felt to be most appropriate for this patient at this time.  All issues noted in this document were discussed and addressed.  No physical exam was performed (except for noted visual exam findings with Video Visits).   I connected with Michael [name] today at 11:00 AM EST by telephone and verified that I am speaking with the correct person using two identifiers. Location patient: home Location provider: home office Persons participating in the virtual visit: patient, provider  I discussed the limitations, risks, security and privacy concerns of performing an evaluation and management service by telephone and the availability of in person appointments. I also discussed with the patient that there may be a patient responsible charge related to this service. The patient expressed understanding and agreed to proceed.  Interactive audio and video telecommunications were attempted between this provider and patient, however failed, due to patient having technical difficulties OR patient did not have access to video capability.  We continued and completed visit with audio only.   Reason for visit: same day visit  HPI: Abdominal pain: Patient reports onset of symptoms about 5 days ago.  Started with a sharp pain that went from the head of his penis up to his umbilicus.  That discomfort is intermittent.  He has had a dull ache in his right lower quadrant and has felt bloated.  He notes there is no tenderness when he presses on his abdomen.  He has had some nausea and has vomited a few times.  No diarrhea.  He has been having bowel movements and notes his last bowel movement was yesterday.  Notes no hematuria though he is urinating much more frequently.  Urinating helps relieve the pain to a certain degree.  He notes his urine  alternates between dark yellow and clear.  He is drinking 10 to 12 cups of water per day.  He notes no dysuria.  No history of kidney stones.  He notes his back has been hurting him as well with this.  T-max 99.7 F.  He notes his prior scrotal discomfort resolved.   ROS: See pertinent positives and negatives per HPI.  Past Medical History:  Diagnosis Date   Allergy    Anal fissure 04/06/2017   Anxiety    Asthma    Chest pain off and on   COVID-19    02/2020   Depression    Family history of adverse reaction to anesthesia    adopted history unknown   GERD (gastroesophageal reflux disease)    Pancreatitis last 2015   Substance abuse ([place])    per pt heroin/cocaine age [age]     Past Surgical History:  Procedure Laterality Date   CHOLECYSTECTOMY  08/16/2015   Procedure: LAPAROSCOPIC CHOLECYSTECTOMY;  Surgeon: Alphonsa Overall, MD;  Location: [facility];  Service: General;;   ESOPHAGOGASTRODUODENOSCOPY (EGD) WITH PROPOFOL N/A 07/27/2016   Procedure: ESOPHAGOGASTRODUODENOSCOPY (EGD) WITH PROPOFOL;  Surgeon: Milus Banister, MD;  [facility];  Service: Endoscopy;  Laterality: N/A;   ESOPHAGOGASTRODUODENOSCOPY (EGD) WITH PROPOFOL N/A 10/10/2017   Procedure: ESOPHAGOGASTRODUODENOSCOPY (EGD) WITH PROPOFOL;  Surgeon: Virgel Manifold, MD;  Location: [facility] ENDOSCOPY;  Service: Endoscopy;  Laterality: N/A;   TONSILLECTOMY AND ADENOIDECTOMY  as child   and adenoids    Family History  Adopted: Yes  Problem Relation Age of Onset   Diabetes Paternal  Grandfather    Lung cancer Paternal Grandfather    Ulcers Father    Colon cancer Neg Hx     SOCIAL HX: smoker   Current Outpatient Medications:    albuterol (VENTOLIN HFA) 108 (90 Base) MCG/ACT inhaler, Inhale 2 puffs into the lungs every 6 (six) hours as needed for wheezing or shortness of breath., Disp: 18 g, Rfl: 11   amitriptyline (ELAVIL) 10 MG tablet, , Disp: , Rfl:    ARIPiprazole (ABILIFY) 10 MG tablet, Take 10 mg by mouth every  morning., Disp: , Rfl:    buPROPion (WELLBUTRIN XL) 150 MG 24 hr tablet, Take 1 tablet by mouth every morning., Disp: , Rfl:    buPROPion (WELLBUTRIN XL) 150 MG 24 hr tablet, Take by mouth., Disp: , Rfl:    CAPLYTA 42 MG capsule, SMARTSIG:1 Capsule(s) By Mouth Every Evening, Disp: , Rfl:    clonazePAM (KLONOPIN) 0.5 MG disintegrating tablet, Take 0.5 mg by mouth 3 (three) times daily as needed., Disp: , Rfl:    diazepam (VALIUM) 10 MG tablet, , Disp: , Rfl:    doxycycline (VIBRA-TABS) 100 MG tablet, Take 1 tablet (100 mg total) by mouth 2 (two) times daily., Disp: 14 tablet, Rfl: 0   DULoxetine (CYMBALTA) 20 MG capsule, Take 20 mg by mouth 2 (two) times daily., Disp: , Rfl:    eszopiclone (LUNESTA) 1 MG TABS tablet, Take 1 mg by mouth at bedtime., Disp: , Rfl:    famotidine-calcium carbonate-magnesium hydroxide (PEPCID COMPLETE) 10-800-165 MG chewable tablet, Chew 1 tablet by mouth daily as needed., Disp: , Rfl:    Fluticasone-Umeclidin-Vilant (TRELEGY ELLIPTA) 200-62.5-25 MCG/INH AEPB, Inhale 1 puff into the lungs daily. Rinse mouth, Disp: 60 each, Rfl: 11   hydrOXYzine (ATARAX/VISTARIL) 50 MG tablet, Take 50 mg by mouth at bedtime., Disp: , Rfl:    naproxen (NAPROSYN) 500 MG tablet, Take 1 tablet (500 mg total) by mouth 2 (two) times daily as needed for mild pain. Take with food., Disp: 30 tablet, Rfl: 0   omeprazole (PRILOSEC) 40 MG capsule, TAKE 1 CAPSULE BY MOUTH ONCE DAILY, Disp: 30 capsule, Rfl: 2   PARoxetine (PAXIL) 30 MG tablet, Take 30 mg by mouth daily., Disp: , Rfl:    prazosin (MINIPRESS) 5 MG capsule, Take 5 mg by mouth at bedtime., Disp: , Rfl:    propranolol ER (INDERAL LA) 60 MG 24 hr capsule, SMARTSIG:1 Capsule(s) By Mouth Every Evening, Disp: , Rfl:    Skin Protectants, Misc. (CERAVE) OINT, Apply 1 application topically daily as needed (itching)., Disp: , Rfl:    traMADol (ULTRAM) 50 MG tablet, Take 1 tablet (50 mg total) by mouth every 6 (six) hours as needed for moderate  pain., Disp: 12 tablet, Rfl: 0   triamcinolone cream (KENALOG) 0.1 %, Apply 1 application topically 2 (two) times daily as needed., Disp: 80 g, Rfl: 2  EXAM: This was a telephone visit and thus no exam was completed.  ASSESSMENT AND PLAN:  Discussed the following assessment and plan:  Problem List Items Addressed This Visit     Abdominal pain    The patient has symptoms that seem most consistent with a likely kidney stone.  Certainly given the discomfort in his right lower quadrant this could represent an appendicitis or other intraabdominal process though the lack of tenderness when he presses on his abdomen would seem to argue against that.  Discussed the need for a CT scan to evaluate for a kidney stone.  CT scan without contrast would also  give [place] some idea if there are findings of inflammation in the right lower quadrant.  We will attempt to get this completed today.  The patient was advised to go to the emergency department if you develop severe discomfort, fever, blood in his urine, or any worsening symptoms.      Relevant Orders   CT Abdomen Pelvis Wo Contrast    Return if symptoms worsen or fail to improve.   I discussed the assessment and treatment plan with the patient. The patient was provided an opportunity to ask questions and all were answered. The patient agreed with the plan and demonstrated an understanding of the instructions.   The patient was advised to call back or seek an in-person evaluation if the symptoms worsen or if the condition fails to improve as anticipated.  I provided 8 minutes of non-face-to-face time during this encounter.   Tommi Rumps, MD

## 2021-01-22 ENCOUNTER — Emergency Department: Payer: Medicare Other

## 2021-01-22 ENCOUNTER — Other Ambulatory Visit: Payer: Self-pay

## 2021-01-22 ENCOUNTER — Emergency Department
Admission: EM | Admit: 2021-01-22 | Discharge: 2021-01-23 | Disposition: A | Discharge status: Home / Self Care | Payer: Medicare Other | Attending: Emergency Medicine | Admitting: Emergency Medicine

## 2021-01-22 DIAGNOSIS — R52 Pain, unspecified: Secondary | ICD-10-CM | POA: Diagnosis not present

## 2021-01-22 DIAGNOSIS — J45909 Unspecified asthma, uncomplicated: Secondary | ICD-10-CM | POA: Insufficient documentation

## 2021-01-22 DIAGNOSIS — I10 Essential (primary) hypertension: Secondary | ICD-10-CM | POA: Insufficient documentation

## 2021-01-22 DIAGNOSIS — K529 Noninfective gastroenteritis and colitis, unspecified: Secondary | ICD-10-CM | POA: Diagnosis not present

## 2021-01-22 DIAGNOSIS — R1031 Right lower quadrant pain: Secondary | ICD-10-CM

## 2021-01-22 DIAGNOSIS — Z8616 Personal history of COVID-19: Secondary | ICD-10-CM | POA: Diagnosis not present

## 2021-01-22 DIAGNOSIS — R1084 Generalized abdominal pain: Secondary | ICD-10-CM | POA: Diagnosis not present

## 2021-01-22 DIAGNOSIS — F17210 Nicotine dependence, cigarettes, uncomplicated: Secondary | ICD-10-CM | POA: Diagnosis not present

## 2021-01-22 LAB — URINALYSIS, ROUTINE W REFLEX MICROSCOPIC
Bilirubin Urine: NEGATIVE
Glucose, UA: NEGATIVE mg/dL
Hgb urine dipstick: NEGATIVE
Ketones, ur: NEGATIVE mg/dL
Leukocytes,Ua: NEGATIVE
Nitrite: NEGATIVE
Protein, ur: NEGATIVE mg/dL
Specific Gravity, Urine: 1.01 (ref 1.005–1.030)
Squamous Epithelial / HPF: NONE SEEN (ref 0–5)
pH: 6 (ref 5.0–8.0)

## 2021-01-22 LAB — CBC
HCT: 45.9 % (ref 39.0–52.0)
Hemoglobin: 16 g/dL (ref 13.0–17.0)
MCH: 33.2 pg (ref 26.0–34.0)
MCHC: 34.9 g/dL (ref 30.0–36.0)
MCV: 95.2 fL (ref 80.0–100.0)
Platelets: 229 10*3/uL (ref 150–400)
RBC: 4.82 MIL/uL (ref 4.22–5.81)
RDW: 13.1 % (ref 11.5–15.5)
WBC: 16.3 10*3/uL — ABNORMAL HIGH (ref 4.0–10.5)
nRBC: 0 % (ref 0.0–0.2)

## 2021-01-22 LAB — COMPREHENSIVE METABOLIC PANEL
ALT: 39 U/L (ref 0–44)
AST: 27 U/L (ref 15–41)
Albumin: 4.6 g/dL (ref 3.5–5.0)
Alkaline Phosphatase: 67 U/L (ref 38–126)
Anion gap: 8 (ref 5–15)
BUN: 11 mg/dL (ref 6–20)
CO2: 24 mmol/L (ref 22–32)
Calcium: 9.7 mg/dL (ref 8.9–10.3)
Chloride: 105 mmol/L (ref 98–111)
Creatinine, Ser: 0.78 mg/dL (ref 0.61–1.24)
GFR, Estimated: >60 mL/min (ref >60)
Glucose, Bld: 102 mg/dL — ABNORMAL HIGH (ref 70–99)
Potassium: 3.8 mmol/L (ref 3.5–5.1)
Sodium: 137 mmol/L (ref 135–145)
Total Bilirubin: 0.7 mg/dL (ref 0.3–1.2)
Total Protein: 7.5 g/dL (ref 6.5–8.1)

## 2021-01-22 LAB — LIPASE, BLOOD: Lipase: 24 U/L (ref 11–51)

## 2021-01-22 MED ORDER — LOPERAMIDE HCL 2 MG PO TABS: 4.0000 mg | ORAL_TABLET | Freq: Four times a day (QID) | ORAL | 0 refills | Status: DC | PRN

## 2021-01-22 MED ORDER — IOHEXOL 300 MG/ML  SOLN
100.0000 mL | Freq: Once | INTRAMUSCULAR | Status: AC | PRN
  Administered 2021-01-22: 100 mL via INTRAVENOUS

## 2021-01-22 MED ORDER — SODIUM CHLORIDE 0.9 % IV BOLUS
1000.0000 mL | Freq: Once | INTRAVENOUS | Status: AC
  Administered 2021-01-22: 1000 mL via INTRAVENOUS

## 2021-01-22 MED ORDER — LOPERAMIDE HCL 2 MG PO CAPS
4.0000 mg | ORAL_CAPSULE | Freq: Once | ORAL | Status: AC
  Administered 2021-01-22: 4 mg via ORAL
  Filled 2021-01-22: qty 2

## 2021-01-22 MED ORDER — DICYCLOMINE HCL 20 MG PO TABS: 20.0000 mg | ORAL_TABLET | Freq: Four times a day (QID) | ORAL | 0 refills | Status: DC

## 2021-01-22 MED ORDER — IBUPROFEN 200 MG PO TABS: 600.0000 mg | ORAL_TABLET | Freq: Four times a day (QID) | ORAL | 0 refills | Status: DC | PRN

## 2021-01-22 MED ORDER — KETAMINE HCL 50 MG/5ML IJ SOSY
0.3000 mg/kg | PREFILLED_SYRINGE | Freq: Once | INTRAMUSCULAR | Status: AC
  Administered 2021-01-22: 25 mg via INTRAVENOUS
  Filled 2021-01-22: qty 5

## 2021-01-22 MED ORDER — KETOROLAC TROMETHAMINE 30 MG/ML IJ SOLN
15.0000 mg | INTRAMUSCULAR | Status: AC
  Administered 2021-01-22: 15 mg via INTRAVENOUS
  Filled 2021-01-22: qty 1

## 2021-01-22 NOTE — ED Notes (Signed)
Pt given fluids with medication - will see how pt tolerates

## 2021-01-22 NOTE — ED Triage Notes (Signed)
FIRST NURSE NOTE:   RLQ abd pain, sharp arrived via [facility] hx of COPD, ashtma 181/90 98% RA p-90

## 2021-01-22 NOTE — ED Provider Notes (Signed)
[facility] Emergency Department Provider Note  ____________________________________________  Time seen: Approximately 11:01 PM  I have reviewed the triage vital signs and the nursing notes.   HISTORY  Chief Complaint Abdominal Pain    HPI Michael [name] is a 30 y.o. male with a past history of anxiety, asthma, pancreatitis, substance abuse who comes ED complaining of lower abdominal pain for the past 3 days, worsening right lower quadrant.  Constant, waxing waning, nonradiating.  Dysuria.  No trauma.  He does endorse vomiting and diarrhea  Was seen in the ED yesterday, had a noncontrast CT scan which was unremarkable.    Past Medical History:  Diagnosis Date  . Allergy   . Anal fissure 04/06/2017  . Anxiety   . Asthma   . Chest pain off and on  . COVID-19    02/2020  . Depression   . Family history of adverse reaction to anesthesia    adopted history unknown  . GERD (gastroesophageal reflux disease)   . Pancreatitis last 2015  . Substance abuse ([place])    per pt heroin/cocaine age [age]      Patient Active Problem List   Diagnosis Date Noted  . Abdominal pain 01/21/2021  . Pain in both testicles 12/15/2020  . Abscess 12/08/2020  . Head trauma 11/03/2020  . Floaters 11/03/2020  . Inflamed sebaceous cyst 07/23/2019  . Multiple lipomas 07/14/2019  . Boil of neck 07/14/2019  . Chronic fatigue 07/14/2019  . BMI 32.0-32.9,adult 07/14/2019  . Abnormal thyroid stimulating hormone level 07/14/2019  . Trochanteric bursitis of left hip 03/06/2018  . CLE (columnar lined esophagus)   . Ectopic gastric tissue   . Heartburn   . Generalized abdominal pain 08/09/2017  . Hiatal hernia 08/09/2017  . Mild intermittent asthma 08/09/2017  . Nausea and vomiting 08/09/2017  . Dysphagia   . Benign esophageal stricture   . GERD (gastroesophageal reflux disease) 07/20/2016  . Cough 06/22/2016  . Colon polyps 03/13/2016  . Hypertension 01/26/2016  .  Chest pain 01/26/2016  . Palpitations 01/26/2016  . Anxiety 11/19/2015  . Chronic diarrhea 11/19/2015  . Syncope 11/19/2015  . Bronchitis 10/26/2015  . Hand injury 10/18/2015  . Rectal pain 06/21/2015  . Depressed 06/21/2015  . Substance induced mood disorder ([place]) 10/09/2014  . Alcohol abuse 10/09/2014  . Marijuana abuse 10/09/2014  . Bipolar disorder ([place]) 10/09/2014  . Psychosis ([place]) 06/03/2012     Past Surgical History:  Procedure Laterality Date  . CHOLECYSTECTOMY  08/16/2015   Procedure: LAPAROSCOPIC CHOLECYSTECTOMY;  Surgeon: Alphonsa Overall, MD;  Location: [facility] ORS;  Service: General;;  . ESOPHAGOGASTRODUODENOSCOPY (EGD) WITH PROPOFOL N/A 07/27/2016   Procedure: ESOPHAGOGASTRODUODENOSCOPY (EGD) WITH PROPOFOL;  Surgeon: Milus Banister, MD;  Location: [facility] ENDOSCOPY;  Service: Endoscopy;  Laterality: N/A;  . ESOPHAGOGASTRODUODENOSCOPY (EGD) WITH PROPOFOL N/A 10/10/2017   Procedure: ESOPHAGOGASTRODUODENOSCOPY (EGD) WITH PROPOFOL;  Surgeon: Virgel Manifold, MD;  Location: [facility] ENDOSCOPY;  Service: Endoscopy;  Laterality: N/A;  . TONSILLECTOMY AND ADENOIDECTOMY  as child   and adenoids     Prior to Admission medications   Medication Sig Start Date End Date Taking? Authorizing Provider  albuterol (VENTOLIN HFA) 108 (90 Base) MCG/ACT inhaler Inhale 2 puffs into the lungs every 6 (six) hours as needed for wheezing or shortness of breath. 07/09/20   McLean-Scocuzza, Nino Glow, MD  amitriptyline (ELAVIL) 10 MG tablet  09/16/20   [provider]  ARIPiprazole (ABILIFY) 10 MG tablet Take 10 mg by mouth every morning. 11/23/20  [provider]  buPROPion (WELLBUTRIN XL) 150 MG 24 hr tablet Take 1 tablet by mouth every morning. 07/02/20   [provider]  buPROPion (WELLBUTRIN XL) 150 MG 24 hr tablet Take by mouth. 11/08/20   [provider]  CAPLYTA 42 MG capsule SMARTSIG:1 Capsule(s) By Mouth Every Evening 10/29/20   [provider]  clonazePAM  (KLONOPIN) 0.5 MG disintegrating tablet Take 0.5 mg by mouth 3 (three) times daily as needed. 04/09/20   [provider]  diazepam (VALIUM) 10 MG tablet  11/29/20   [provider]  doxycycline (VIBRA-TABS) 100 MG tablet Take 1 tablet (100 mg total) by mouth 2 (two) times daily. 11/03/20   [name], MD  DULoxetine (CYMBALTA) 20 MG capsule Take 20 mg by mouth 2 (two) times daily. 07/01/20   [provider]  eszopiclone (LUNESTA) 1 MG TABS tablet Take 1 mg by mouth at bedtime. 07/02/20   [provider]  famotidine-calcium carbonate-magnesium hydroxide (PEPCID COMPLETE) 10-800-165 MG chewable tablet Chew 1 tablet by mouth daily as needed.    [provider]  Fluticasone-Umeclidin-Vilant (TRELEGY ELLIPTA) 200-62.5-25 MCG/INH AEPB Inhale 1 puff into the lungs daily. Rinse mouth 07/09/20   McLean-Scocuzza, Nino Glow, MD  hydrOXYzine (ATARAX/VISTARIL) 50 MG tablet Take 50 mg by mouth at bedtime.    [provider]  naproxen (NAPROSYN) 500 MG tablet Take 1 tablet (500 mg total) by mouth 2 (two) times daily as needed for mild pain. Take with food. 12/15/20   [name], MD  omeprazole (PRILOSEC) 40 MG capsule TAKE 1 CAPSULE BY MOUTH ONCE DAILY 01/14/21   [name], MD  PARoxetine (PAXIL) 30 MG tablet Take 30 mg by mouth daily.    [provider]  prazosin (MINIPRESS) 5 MG capsule Take 5 mg by mouth at bedtime. 07/02/20   [provider]  propranolol ER (INDERAL LA) 60 MG 24 hr capsule SMARTSIG:1 Capsule(s) By Mouth Every Evening 09/27/20   [provider]  Skin Protectants, Misc. (CERAVE) OINT Apply 1 application topically daily as needed (itching).    [provider]  traMADol (ULTRAM) 50 MG tablet Take 1 tablet (50 mg total) by mouth every 6 (six) hours as needed for moderate pain. 12/20/20   Stoioff, Ronda Fairly, MD  triamcinolone cream (KENALOG) 0.1 % Apply 1 application topically 2 (two) times daily as  needed. 12/31/19   Brendolyn Patty, MD     Allergies Bee venom, Quetiapine, Serotonin reuptake inhibitors (ssris), Amoxicillin, Gabapentin, Prednisone, and Latuda [lurasidone hcl]   Family History  Adopted: Yes  Problem Relation Age of Onset  . Diabetes Paternal Grandfather   . Lung cancer Paternal Grandfather   . Ulcers Father   . Colon cancer Neg Hx     Social History Social History   Tobacco Use  . Smoking status: Every Day    Packs/day: 1.50    Years: 15.00    Pack years: 22.50    Types: Cigarettes  . Smokeless tobacco: Former    Types: Chew    Quit date: 08/12/2017  . Tobacco comments:    1 pack per day--09/08/2019  Vaping Use  . Vaping Use: Never used  Substance Use Topics  . Alcohol use: Not Currently    Alcohol/week: 24.0 standard drinks    Types: 24 Cans of beer per week  . Drug use: Yes    Types: Marijuana    Comment: heroin, cocaine, acid, pain killers; last use yrs ago, marijuana daily use 08/13/18  Review of Systems  Constitutional:   No fever positive chills.  ENT:   No sore throat. No rhinorrhea. Cardiovascular:   No chest pain or syncope. Respiratory:   No dyspnea or cough. Gastrointestinal:   Positive as above for abdominal pain with vomiting and diarrhea..  Musculoskeletal:   Negative for focal pain or swelling All other systems reviewed and are negative except as documented above in ROS and HPI.  ____________________________________________   PHYSICAL EXAM:  VITAL SIGNS: ED Triage Vitals  Enc Vitals Group     BP 01/22/21 1925 (!) 134/116     Pulse Rate 01/22/21 1925 86     Resp 01/22/21 1925 20     Temp 01/22/21 1925 98.5 F (36.9 C)     Temp Source 01/22/21 1925 Oral     SpO2 01/22/21 1925 98 %     Weight 01/22/21 1926 187 lb (84.8 kg)     Height 01/22/21 1926 5\' 7"  (1.702 m)     Head Circumference --      Peak Flow --      Pain Score 01/22/21 1926 7     Pain Loc --      Pain Edu? --      Excl. in [place]? --     Vital signs  reviewed, nursing assessments reviewed.   Constitutional:   Alert and oriented. Non-toxic appearance. Eyes:   Conjunctivae are normal. EOMI. PERRL. ENT      Head:   Normocephalic and atraumatic.      Nose:   Normal      Mouth/Throat:   Normal, moist mucosa      Neck:   No meningismus. Full ROM. Hematological/Lymphatic/Immunilogical:   No cervical lymphadenopathy. Cardiovascular:   RRR. Symmetric bilateral radial and DP pulses.  No murmurs. Cap refill less than 2 seconds. Respiratory:   Normal respiratory effort without tachypnea/retractions. Breath sounds are clear and equal bilaterally. No wheezes/rales/rhonchi. Gastrointestinal:   Soft with exquisite right lower quadrant tenderness and mild generalized tenderness. Non distended.  No rebound, rigidity, or guarding. Genitourinary:   deferred Musculoskeletal:   Normal range of motion in all extremities. No joint effusions.  No lower extremity tenderness.  No edema. Neurologic:   Normal speech and language.  Motor grossly intact. No acute focal neurologic deficits are appreciated.  Skin:    Skin is warm, dry and intact. No rash noted.  No petechiae, purpura, or bullae.  ____________________________________________    LABS (pertinent positives/negatives) (all labs ordered are listed, but only abnormal results are displayed) Labs Reviewed  COMPREHENSIVE METABOLIC PANEL - Abnormal; Notable for the following components:      Result Value   Glucose, Bld 102 (*)    All other components within normal limits  CBC - Abnormal; Notable for the following components:   WBC 16.3 (*)    All other components within normal limits  URINALYSIS, ROUTINE W REFLEX MICROSCOPIC - Abnormal; Notable for the following components:   Bacteria, UA RARE (*)    All other components within normal limits  LIPASE, BLOOD   ____________________________________________   EKG    ____________________________________________    RADIOLOGY  No results  found.  ____________________________________________   PROCEDURES Procedures  ____________________________________________  DIFFERENTIAL DIAGNOSIS   Appendicitis, diverticulitis, viral illness, dehydration  CLINICAL IMPRESSION / ASSESSMENT AND PLAN / ED COURSE  Medications ordered in the ED: Medications  sodium chloride 0.9 % bolus 1,000 mL (1,000 mLs Intravenous New Bag/Given 01/22/21 2120)  ketorolac (TORADOL) 30 MG/ML injection 15 mg (  15 mg Intravenous Given 01/22/21 2120)  ketamine 50 mg in normal saline 5 mL (10 mg/mL) syringe (25 mg Intravenous Given 01/22/21 2120)  iohexol (OMNIPAQUE) 300 MG/ML solution 100 mL (100 mLs Intravenous Contrast Given 01/22/21 2130)    Pertinent labs & imaging results that were available during my care of the patient were reviewed by me and considered in my medical decision making (see chart for details).  Michael [name] was evaluated in Emergency Department on 01/22/2021 for the symptoms described in the history of present illness. He was evaluated in the context of the global COVID-19 pandemic, which necessitated consideration that the patient might be at risk for infection with the SARS-CoV-2 virus that causes COVID-19. Institutional protocols and algorithms that pertain to the evaluation of patients at risk for COVID-19 are in a state of rapid change based on information released by regulatory bodies including the CDC and federal and state organizations. These policies and algorithms were followed during the patient's care in the ED.   Patient presents with worsening lower abdominal pain and right lower quadrant tenderness.  Due to the severity of his symptoms we will need to repeat CT scan today with IV contrast.  I doubt mesenteric ischemia or dissection or AAA or other vascular occlusion.  High suspicion for appendicitis.  Labs unremarkable except for leukocytosis.  Patient is reluctant to receive opioids for pain control due to his past substance  abuse issues, so I will order Toradol and ketamine.  Clinical Course as of 01/22/21 2308  Sat Jan 22, 2021  2307 CT report:  IMPRESSION: 1. Normal appendix. 2. No acute intra-abdominal or intrapelvic abnormality. 3. Similar-appearing 1.6 cm left hepatic lobe lesion likely representing a flash filling hemangioma.   [PS]    Clinical Course User Index [PS] Carrie Mew, MD      ____________________________________________   FINAL CLINICAL IMPRESSION(S) / ED DIAGNOSES    Final diagnoses:  Right lower quadrant abdominal pain     ED Discharge Orders     None       Portions of this note were generated with dragon dictation software. Dictation errors may occur despite best attempts at proofreading.    Carrie Mew, MD 01/22/21 2306

## 2021-01-22 NOTE — ED Triage Notes (Signed)
Pt presents to ER c/o RLQ pain x3 days.  Pt states he has had a stabbing pain in his RLQ that has been getting progressively worse over last few days but tonight has made pt "crumple up" at home.  Pt denies any appendectomy but states he has constant problems with his GI system.  Pt does state he has associated n/v/d with the abd pain and also states he has had to urinate more frequently.  Pt A&O x4 at this time.

## 2021-01-25 NOTE — Progress Notes (Signed)
01/26/2021 3:26 PM   Michael [name] 1990-05-15 [id number]  Referring provider: [name], MD [address] [place],  [place] [address]  Chief Complaint  Patient presents with   Follow-up   Urological history 1. Bilateral scrotal pain -CT 01/2021 - NED  2. Epididymitis -scrotal US 11/2020 - Mildly increased vascularity of the right testicle and right epididymis which can be seen in the setting of epididymo orchitis  HPI: Michael [name] is a 30 y.o. male who presents today for one month follow up with his mother, Michael [name].    At his visit on 12/20/2020, he was continuing to have bilateral scrotal pain after an I & D of right groin abscess and doxycycline for cellulitis.  He was given Naprosyn and a pelvic CT was ordered which was NED.    He states that the pain is getting worse.  The pain is located scrotally radiating up into the suprapubic area through his groin area his waist bilaterally.  He is having associated frequency, urgency and dysuria.    He also has been having nausea and vomiting and now has started having incontinent episodes at night and 1 during the day.  He has not noted any difficulty with bowel movements.  He denies any numbness or tingling in the perineal area and denies any incontinence of bowels.  He denies any gross hematuria, fevers or chills.  He has not noted anything that makes his pain worse and nothing that makes the pain better.  PMH: Past Medical History:  Diagnosis Date   Allergy    Anal fissure 04/06/2017   Anxiety    Asthma    Chest pain off and on   COVID-19    02/2020   Depression    Family history of adverse reaction to anesthesia    adopted history unknown   GERD (gastroesophageal reflux disease)    Pancreatitis last 2015   Substance abuse ([place])    per pt heroin/cocaine age [age]     Surgical History: Past Surgical History:  Procedure Laterality Date   CHOLECYSTECTOMY  08/16/2015   Procedure:  LAPAROSCOPIC CHOLECYSTECTOMY;  Surgeon: Alphonsa Overall, MD;  Location: [facility];  Service: General;;   ESOPHAGOGASTRODUODENOSCOPY (EGD) WITH PROPOFOL N/A 07/27/2016   Procedure: ESOPHAGOGASTRODUODENOSCOPY (EGD) WITH PROPOFOL;  Surgeon: Milus Banister, MD;  Location: [facility];  Service: Endoscopy;  Laterality: N/A;   ESOPHAGOGASTRODUODENOSCOPY (EGD) WITH PROPOFOL N/A 10/10/2017   Procedure: ESOPHAGOGASTRODUODENOSCOPY (EGD) WITH PROPOFOL;  Surgeon: Virgel Manifold, MD;  Location: [facility];  Service: Endoscopy;  Laterality: N/A;   TONSILLECTOMY AND ADENOIDECTOMY  as child   and adenoids    Home Medications:  Allergies as of 01/26/2021       Reactions   Bee Venom Anaphylaxis   Quetiapine Rash   Serotonin Reuptake Inhibitors (ssris) Rash   Amoxicillin Diarrhea   Has patient had a PCN reaction causing immediate rash, facial/tongue/throat swelling, SOB or lightheadedness with hypotension: No Has patient had a PCN reaction causing severe rash involving mucus membranes or skin necrosis: No Has patient had a PCN reaction that required hospitalization: No Has patient had a PCN reaction occurring within the last 10 years: Yes If all of the above answers are "NO", then may proceed with Cephalosporin use.   Gabapentin Nausea And Vomiting   diarrhea   Prednisone Other (See Comments)   Difficulty breathing   Latuda [lurasidone Hcl] Rash, Nausea And Vomiting        Medication List  Accurate as of January 26, 2021  3:26 PM. If you have any questions, ask your nurse or doctor.          albuterol 108 (90 Base) MCG/ACT inhaler Commonly known as: VENTOLIN HFA Inhale 2 puffs into the lungs every 6 (six) hours as needed for wheezing or shortness of breath.   amitriptyline 10 MG tablet Commonly known as: ELAVIL   ARIPiprazole 10 MG tablet Commonly known as: ABILIFY Take 10 mg by mouth every morning.   buPROPion 150 MG 24 hr tablet Commonly known as: WELLBUTRIN XL Take 1  tablet by mouth every morning.   buPROPion 150 MG 24 hr tablet Commonly known as: WELLBUTRIN XL Take by mouth.   Caplyta 42 MG capsule Generic drug: lumateperone tosylate SMARTSIG:1 Capsule(s) By Mouth Every Evening   CeraVe Oint Apply 1 application topically daily as needed (itching).   clonazePAM 0.5 MG disintegrating tablet Commonly known as: KLONOPIN Take 0.5 mg by mouth 3 (three) times daily as needed.   diazepam 10 MG tablet Commonly known as: VALIUM   dicyclomine 20 MG tablet Commonly known as: BENTYL Take 1 tablet (20 mg total) by mouth every 6 (six) hours for 5 days.   doxycycline 100 MG tablet Commonly known as: VIBRA-TABS Take 1 tablet (100 mg total) by mouth 2 (two) times daily.   DULoxetine 20 MG capsule Commonly known as: CYMBALTA Take 20 mg by mouth 2 (two) times daily.   eszopiclone 1 MG Tabs tablet Commonly known as: LUNESTA Take 1 mg by mouth at bedtime.   famotidine-calcium carbonate-magnesium hydroxide 10-800-165 MG chewable tablet Commonly known as: PEPCID COMPLETE Chew 1 tablet by mouth daily as needed.   hydrOXYzine 50 MG tablet Commonly known as: ATARAX Take 50 mg by mouth at bedtime.   ibuprofen 200 MG tablet Commonly known as: Motrin IB Take 3 tablets (600 mg total) by mouth every 6 (six) hours as needed.   loperamide 2 MG tablet Commonly known as: IMODIUM A-D Take 2 tablets (4 mg total) by mouth 4 (four) times daily as needed for diarrhea or loose stools.   naproxen 500 MG tablet Commonly known as: Naprosyn Take 1 tablet (500 mg total) by mouth 2 (two) times daily as needed for mild pain. Take with food.   omeprazole 40 MG capsule Commonly known as: PRILOSEC TAKE 1 CAPSULE BY MOUTH ONCE DAILY   PARoxetine 30 MG tablet Commonly known as: PAXIL Take 30 mg by mouth daily.   prazosin 5 MG capsule Commonly known as: MINIPRESS Take 5 mg by mouth at bedtime.   propranolol ER 60 MG 24 hr capsule Commonly known as: INDERAL  LA SMARTSIG:1 Capsule(s) By Mouth Every Evening   traMADol 50 MG tablet Commonly known as: ULTRAM Take 1 tablet (50 mg total) by mouth every 6 (six) hours as needed for moderate pain.   Trelegy Ellipta 200-62.5-25 MCG/ACT Aepb Generic drug: Fluticasone-Umeclidin-Vilant Inhale 1 puff into the lungs daily. Rinse mouth   triamcinolone cream 0.1 % Commonly known as: KENALOG Apply 1 application topically 2 (two) times daily as needed.        Allergies:  Allergies  Allergen Reactions   Bee Venom Anaphylaxis   Quetiapine Rash   Serotonin Reuptake Inhibitors (Ssris) Rash   Amoxicillin Diarrhea    Has patient had a PCN reaction causing immediate rash, facial/tongue/throat swelling, SOB or lightheadedness with hypotension: No Has patient had a PCN reaction causing severe rash involving mucus membranes or skin necrosis: No Has patient had a PCN  reaction that required hospitalization: No Has patient had a PCN reaction occurring within the last 10 years: Yes If all of the above answers are "NO", then may proceed with Cephalosporin use.    Gabapentin Nausea And Vomiting    diarrhea   Prednisone Other (See Comments)    Difficulty breathing   Latuda [Lurasidone Hcl] Rash and Nausea And Vomiting    Family History: Family History  Adopted: Yes  Problem Relation Age of Onset   Diabetes Paternal Grandfather    Lung cancer Paternal Grandfather    Ulcers Father    Colon cancer Neg Hx     Social History:  reports that he has been smoking cigarettes. He has a 22.50 pack-year smoking history. He quit smokeless tobacco use about 3 years ago.  His smokeless tobacco use included chew. He reports that he does not currently use alcohol after a past usage of about 24.0 standard drinks per week. He reports current drug use. Drug: Marijuana.  ROS: Pertinent ROS in HPI  Physical Exam: BP 135/81   Pulse 79   Ht 5\' 7"  (1.702 m)   Wt 187 lb (84.8 kg)   BMI 29.29 kg/m   Constitutional:  Well  nourished. Alert and oriented, No acute distress. HEENT: [place] AT, mask in place.  Trachea midline Cardiovascular: No clubbing, cyanosis, or edema. Respiratory: Normal respiratory effort, no increased work of breathing. GU: No CVA tenderness.  No bladder fullness or masses.  Patient with uncircumcised phallus. Foreskin easily retracted  Urethral meatus is patent.  No penile discharge. No penile lesions or rashes. Scrotum without lesions, cysts, rashes and/or edema.  Testicles are located scrotally bilaterally. No masses are appreciated in the testicles. Left and right epididymis are normal. Rectal: Patient with  normal sphincter tone. Anus and perineum without scarring or rashes. No rectal masses are appreciated.  Could not complete prostate exam due to patient clenching buttocks together and could not relax.   Neurologic: Grossly intact, no focal deficits, moving all 4 extremities. Psychiatric: Normal mood and affect. Of note: Patient's pain was out of proportion with today's GU exam.    Laboratory Data: Lab Results  Component Value Date   WBC 16.3 (H) 01/22/2021   HGB 16.0 01/22/2021   HCT 45.9 01/22/2021   MCV 95.2 01/22/2021   PLT 229 01/22/2021    Lab Results  Component Value Date   CREATININE 0.78 01/22/2021   Lab Results  Component Value Date   TSH 1.260 07/09/2020    Lab Results  Component Value Date   AST 27 01/22/2021   Lab Results  Component Value Date   ALT 39 01/22/2021    Urinalysis Component     Latest Ref Rng & Units 01/26/2021  Specific Gravity, UA     1.005 - 1.030 1.020  pH, UA     5.0 - 7.5 6.5  Color, UA     Yellow Yellow  Appearance Ur     Clear Clear  Leukocytes,UA     Negative Negative  Protein,UA     Negative/Trace Negative  Glucose, UA     Negative Negative  Ketones, UA     Negative Trace (A)  RBC, UA     Negative Negative  Bilirubin, UA     Negative Negative  Urobilinogen, Ur     0.2 - 1.0 mg/dL 0.2  Nitrite, UA     Negative  Negative  Microscopic Examination      See below:   Component  Latest Ref Rng & Units 01/26/2021  WBC, UA     0 - 5 /hpf 0-5  RBC     0 - 2 /hpf None seen  Epithelial Cells (non renal)     0 - 10 /hpf 0-10  Bacteria, UA     None seen/Few None seen   I have reviewed the labs.   Pertinent Imaging: N/A  Assessment & Plan:    1. Bilateral scrotal pain -Pain out of proportion with today's exam -UA is benign -urine culture is pending -As exam was benign for the exception of the prostate gland as I could not palpate the gland due to patient not being able to relax his buttocks and he has had three CT scans recently that have not elicited a reason for his pain, I am concerned that he is either malingering, experiencing somatic pain or this is neurological in nature -Patient's mother asked if they could be prescribe something stronger than the naproxen for his pain as she states a previous doctor did not recommend prescribing pain medications for him and I explained that as I cannot find an etiology for his pain at this time I could not prescribe narcotic pain medication and to reach out to his PCP -At this time we will refer to a tertiary center so that he may have a more in-depth evaluation and have access to more specialists  2. Incontinence -UA is negative, so low suspicion for urinary tract infection causing the recent onset of incontinence but urine culture is pending -PVR is 0 cc, so no evidence of overflow incontinence -As patient endorsed extreme pain during entire genitourinary exam and is not having bowel incontinence, perineal anesthesia, pain or weakness in his legs, I have low suspicion for cauda equine syndrome  Return for [facility] for second opinion .  These notes generated with voice recognition software. I apologize for typographical errors.  Zara Council, PA-C  [place] [address]  [place], [place] [address] [phone]   I spent 30 minutes on the day of the encounter to include pre-visit record review, face-to-face time with the patient, and post-visit ordering of tests.

## 2021-01-26 ENCOUNTER — Other Ambulatory Visit: Payer: Self-pay

## 2021-01-26 ENCOUNTER — Encounter: Payer: Self-pay | Admitting: Urology

## 2021-01-26 ENCOUNTER — Ambulatory Visit (INDEPENDENT_AMBULATORY_CARE_PROVIDER_SITE_OTHER): Payer: Medicare Other | Admitting: Urology

## 2021-01-26 VITALS — BP 135/81 | HR 79 | Ht 67.0 in | Wt 187.0 lb

## 2021-01-26 DIAGNOSIS — R300 Dysuria: Secondary | ICD-10-CM

## 2021-01-26 DIAGNOSIS — N5082 Scrotal pain: Secondary | ICD-10-CM

## 2021-01-26 LAB — URINALYSIS, COMPLETE
Bilirubin, UA: NEGATIVE
Glucose, UA: NEGATIVE
Leukocytes,UA: NEGATIVE
Nitrite, UA: NEGATIVE
Protein,UA: NEGATIVE
RBC, UA: NEGATIVE
Specific Gravity, UA: 1.02 (ref 1.005–1.030)
Urobilinogen, Ur: 0.2 mg/dL (ref 0.2–1.0)
pH, UA: 6.5 (ref 5.0–7.5)

## 2021-01-26 LAB — BLADDER SCAN AMB NON-IMAGING

## 2021-01-26 LAB — MICROSCOPIC EXAMINATION
Bacteria, UA: NONE SEEN
RBC, Urine: NONE SEEN /hpf (ref 0–2)

## 2021-01-28 DIAGNOSIS — F3181 Bipolar II disorder: Secondary | ICD-10-CM | POA: Diagnosis not present

## 2021-01-28 DIAGNOSIS — F410 Panic disorder [episodic paroxysmal anxiety] without agoraphobia: Secondary | ICD-10-CM | POA: Diagnosis not present

## 2021-01-28 DIAGNOSIS — F411 Generalized anxiety disorder: Secondary | ICD-10-CM | POA: Diagnosis not present

## 2021-01-28 DIAGNOSIS — F4310 Post-traumatic stress disorder, unspecified: Secondary | ICD-10-CM | POA: Diagnosis not present

## 2021-01-29 LAB — CULTURE, URINE COMPREHENSIVE

## 2021-01-31 ENCOUNTER — Other Ambulatory Visit: Payer: Self-pay | Admitting: Urology

## 2021-01-31 MED ORDER — LEVOFLOXACIN 500 MG PO TABS: 500.0000 mg | ORAL_TABLET | Freq: Every day | ORAL | 0 refills | Status: DC

## 2021-02-01 ENCOUNTER — Encounter: Payer: Self-pay | Admitting: Family Medicine

## 2021-02-03 DIAGNOSIS — K219 Gastro-esophageal reflux disease without esophagitis: Secondary | ICD-10-CM | POA: Diagnosis not present

## 2021-02-03 DIAGNOSIS — N50819 Testicular pain, unspecified: Secondary | ICD-10-CM | POA: Diagnosis not present

## 2021-02-03 DIAGNOSIS — F419 Anxiety disorder, unspecified: Secondary | ICD-10-CM | POA: Diagnosis not present

## 2021-02-03 DIAGNOSIS — R350 Frequency of micturition: Secondary | ICD-10-CM | POA: Diagnosis not present

## 2021-02-03 DIAGNOSIS — R102 Pelvic and perineal pain: Secondary | ICD-10-CM | POA: Diagnosis not present

## 2021-02-03 DIAGNOSIS — Z880 Allergy status to penicillin: Secondary | ICD-10-CM | POA: Diagnosis not present

## 2021-02-03 DIAGNOSIS — F17210 Nicotine dependence, cigarettes, uncomplicated: Secondary | ICD-10-CM | POA: Diagnosis not present

## 2021-02-03 DIAGNOSIS — R399 Unspecified symptoms and signs involving the genitourinary system: Secondary | ICD-10-CM | POA: Diagnosis not present

## 2021-02-03 DIAGNOSIS — F4310 Post-traumatic stress disorder, unspecified: Secondary | ICD-10-CM | POA: Diagnosis not present

## 2021-02-03 DIAGNOSIS — R32 Unspecified urinary incontinence: Secondary | ICD-10-CM | POA: Diagnosis not present

## 2021-02-03 DIAGNOSIS — R1031 Right lower quadrant pain: Secondary | ICD-10-CM | POA: Diagnosis not present

## 2021-02-03 DIAGNOSIS — R300 Dysuria: Secondary | ICD-10-CM | POA: Diagnosis not present

## 2021-02-03 DIAGNOSIS — M79604 Pain in right leg: Secondary | ICD-10-CM | POA: Diagnosis not present

## 2021-02-10 DIAGNOSIS — F4310 Post-traumatic stress disorder, unspecified: Secondary | ICD-10-CM | POA: Diagnosis not present

## 2021-02-10 DIAGNOSIS — F410 Panic disorder [episodic paroxysmal anxiety] without agoraphobia: Secondary | ICD-10-CM | POA: Diagnosis not present

## 2021-02-10 DIAGNOSIS — F3181 Bipolar II disorder: Secondary | ICD-10-CM | POA: Diagnosis not present

## 2021-02-10 DIAGNOSIS — F411 Generalized anxiety disorder: Secondary | ICD-10-CM | POA: Diagnosis not present

## 2021-02-28 NOTE — Telephone Encounter (Signed)
Left Message to call office for appointment needs evaluation of scrotum for infection.

## 2021-02-28 NOTE — Telephone Encounter (Signed)
Pt is returning your call

## 2021-02-28 NOTE — Telephone Encounter (Signed)
Spoke with patient he sated area is not hot to touch but tender and rash around ares there is a small pustule on tight side of scrotum times 5 days. Patient is wanting referral to have lanced, area is very tender, advised patient redness, heat or increased pain needs to be seen immediately, scheduled with NP 02/20/2021 at 1 PM. Sending to NP fo rFYI scheduled 30 minute visit. See Note from 12/20/20 from urology patient has extensive HX and see ED note from 12/08/20 [facility].

## 2021-02-28 NOTE — Telephone Encounter (Signed)
Patient going to [facility]

## 2021-03-01 ENCOUNTER — Telehealth: Payer: Self-pay | Admitting: Family Medicine

## 2021-03-01 ENCOUNTER — Ambulatory Visit: Payer: Medicare Other | Admitting: Family

## 2021-03-01 DIAGNOSIS — L02214 Cutaneous abscess of groin: Secondary | ICD-10-CM | POA: Diagnosis not present

## 2021-03-01 NOTE — Telephone Encounter (Signed)
Pt mom called in stating pt need urgent referral to dermatologist

## 2021-03-02 DIAGNOSIS — F410 Panic disorder [episodic paroxysmal anxiety] without agoraphobia: Secondary | ICD-10-CM | POA: Diagnosis not present

## 2021-03-02 DIAGNOSIS — F3181 Bipolar II disorder: Secondary | ICD-10-CM | POA: Diagnosis not present

## 2021-03-02 DIAGNOSIS — F411 Generalized anxiety disorder: Secondary | ICD-10-CM | POA: Diagnosis not present

## 2021-03-02 DIAGNOSIS — F4310 Post-traumatic stress disorder, unspecified: Secondary | ICD-10-CM | POA: Diagnosis not present

## 2021-03-02 NOTE — Telephone Encounter (Signed)
LVM for patient or his mother to call back to see why they need a referral to dermatology to place the order.  Comfort Iversen,cma

## 2021-03-03 NOTE — Telephone Encounter (Signed)
Pt mom Michael [name]) called in stating she returning call. Pt mom requesting callback

## 2021-03-10 ENCOUNTER — Other Ambulatory Visit: Payer: Self-pay | Admitting: Family

## 2021-03-10 DIAGNOSIS — D499 Neoplasm of unspecified behavior of unspecified site: Secondary | ICD-10-CM

## 2021-03-10 NOTE — Telephone Encounter (Signed)
I called and spoke with the patient himself and he stated he needed a dermatology referral because he has some moles on his back that his mom stated looked suspicious.  Jenavive Lamboy,cma

## 2021-03-18 DIAGNOSIS — F319 Bipolar disorder, unspecified: Secondary | ICD-10-CM | POA: Diagnosis not present

## 2021-03-18 DIAGNOSIS — F411 Generalized anxiety disorder: Secondary | ICD-10-CM | POA: Diagnosis not present

## 2021-03-18 DIAGNOSIS — F4310 Post-traumatic stress disorder, unspecified: Secondary | ICD-10-CM | POA: Diagnosis not present

## 2021-03-18 DIAGNOSIS — F29 Unspecified psychosis not due to a substance or known physiological condition: Secondary | ICD-10-CM | POA: Diagnosis not present

## 2021-04-06 NOTE — Progress Notes (Unsigned)
NEUROLOGY CONSULTATION NOTE  Michael [name] MRN: [id number] DOB: 03-28-1990  Referring provider: Tommi Rumps, MD Primary care provider: Tommi Rumps, MD  Reason for consult:  right sided weakness  Assessment/Plan:   ***   Subjective:  Michael [name] is a 31 year old male with Bipolar disorder, PTSD and GAD who presents for weakness.  History supplemented by referring provider's notes.    ***.  He had an MRI of the cervical spine without contrast on 12/23/2020 which was personally reviewed and was normal without significant stenosis or spinal cord abnormalities.     PAST MEDICAL HISTORY: Past Medical History:  Diagnosis Date   Allergy    Anal fissure 04/06/2017   Anxiety    Asthma    Chest pain off and on   COVID-19    02/2020   Depression    Family history of adverse reaction to anesthesia    adopted history unknown   GERD (gastroesophageal reflux disease)    Pancreatitis last 2015   Substance abuse ([place])    per pt heroin/cocaine age [age]     PAST SURGICAL HISTORY: Past Surgical History:  Procedure Laterality Date   CHOLECYSTECTOMY  08/16/2015   Procedure: LAPAROSCOPIC CHOLECYSTECTOMY;  Surgeon: Alphonsa Overall, MD;  Location: [facility];  Service: General;;   ESOPHAGOGASTRODUODENOSCOPY (EGD) WITH PROPOFOL N/A 07/27/2016   Procedure: ESOPHAGOGASTRODUODENOSCOPY (EGD) WITH PROPOFOL;  Surgeon: Milus Banister, MD;  Location: [facility];  Service: Endoscopy;  Laterality: N/A;   ESOPHAGOGASTRODUODENOSCOPY (EGD) WITH PROPOFOL N/A 10/10/2017   Procedure: ESOPHAGOGASTRODUODENOSCOPY (EGD) WITH PROPOFOL;  Surgeon: Virgel Manifold, MD;  Location: [facility];  Service: Endoscopy;  Laterality: N/A;   TONSILLECTOMY AND ADENOIDECTOMY  as child   and adenoids    MEDICATIONS: Current Outpatient Medications on File Prior to Visit  Medication Sig Dispense Refill   albuterol (VENTOLIN HFA) 108 (90 Base) MCG/ACT inhaler Inhale 2 puffs into the lungs every 6 (six) hours  as needed for wheezing or shortness of breath. 18 g 11   amitriptyline (ELAVIL) 10 MG tablet      ARIPiprazole (ABILIFY) 10 MG tablet Take 10 mg by mouth every morning.     buPROPion (WELLBUTRIN XL) 150 MG 24 hr tablet Take 1 tablet by mouth every morning.     buPROPion (WELLBUTRIN XL) 150 MG 24 hr tablet Take by mouth.     clonazePAM (KLONOPIN) 0.5 MG disintegrating tablet Take 0.5 mg by mouth 3 (three) times daily as needed.     diazepam (VALIUM) 10 MG tablet      dicyclomine (BENTYL) 20 MG tablet Take 1 tablet (20 mg total) by mouth every 6 (six) hours for 5 days. 20 tablet 0   doxycycline (VIBRA-TABS) 100 MG tablet Take 1 tablet (100 mg total) by mouth 2 (two) times daily. 14 tablet 0   DULoxetine (CYMBALTA) 20 MG capsule Take 20 mg by mouth 2 (two) times daily.     eszopiclone (LUNESTA) 1 MG TABS tablet Take 1 mg by mouth at bedtime.     famotidine-calcium carbonate-magnesium hydroxide (PEPCID COMPLETE) 10-800-165 MG chewable tablet Chew 1 tablet by mouth daily as needed.     Fluticasone-Umeclidin-Vilant (TRELEGY ELLIPTA) 200-62.5-25 MCG/INH AEPB Inhale 1 puff into the lungs daily. Rinse mouth 60 each 11   hydrOXYzine (ATARAX/VISTARIL) 50 MG tablet Take 50 mg by mouth at bedtime.     ibuprofen (MOTRIN IB) 200 MG tablet Take 3 tablets (600 mg total) by mouth every 6 (six) hours as needed. 60 tablet 0  levofloxacin (LEVAQUIN) 500 MG tablet Take 1 tablet (500 mg total) by mouth daily. 7 tablet 0   loperamide (IMODIUM A-D) 2 MG tablet Take 2 tablets (4 mg total) by mouth 4 (four) times daily as needed for diarrhea or loose stools. 30 tablet 0   naproxen (NAPROSYN) 500 MG tablet Take 1 tablet (500 mg total) by mouth 2 (two) times daily as needed for mild pain. Take with food. 30 tablet 0   omeprazole (PRILOSEC) 40 MG capsule TAKE 1 CAPSULE BY MOUTH ONCE DAILY 30 capsule 2   PARoxetine (PAXIL) 30 MG tablet Take 30 mg by mouth daily.     prazosin (MINIPRESS) 5 MG capsule Take 5 mg by mouth at  bedtime.     propranolol ER (INDERAL LA) 60 MG 24 hr capsule SMARTSIG:1 Capsule(s) By Mouth Every Evening     Skin Protectants, Misc. (CERAVE) OINT Apply 1 application topically daily as needed (itching).     traMADol (ULTRAM) 50 MG tablet Take 1 tablet (50 mg total) by mouth every 6 (six) hours as needed for moderate pain. 12 tablet 0   triamcinolone cream (KENALOG) 0.1 % Apply 1 application topically 2 (two) times daily as needed. 80 g 2   No current facility-administered medications on file prior to visit.    ALLERGIES: Allergies  Allergen Reactions   Bee Venom Anaphylaxis   Quetiapine Rash   Serotonin Reuptake Inhibitors (Ssris) Rash   Amoxicillin Diarrhea    Has patient had a PCN reaction causing immediate rash, facial/tongue/throat swelling, SOB or lightheadedness with hypotension: No Has patient had a PCN reaction causing severe rash involving mucus membranes or skin necrosis: No Has patient had a PCN reaction that required hospitalization: No Has patient had a PCN reaction occurring within the last 10 years: Yes If all of the above answers are "NO", then may proceed with Cephalosporin use.    Gabapentin Nausea And Vomiting    diarrhea   Prednisone Other (See Comments)    Difficulty breathing   Latuda [Lurasidone Hcl] Rash and Nausea And Vomiting    FAMILY HISTORY: Family History  Adopted: Yes  Problem Relation Age of Onset   Diabetes Paternal Grandfather    Lung cancer Paternal Grandfather    Ulcers Father    Colon cancer Neg Hx     Objective:  *** General: No acute distress.  Patient appears well-groomed.   Head:  Normocephalic/atraumatic Eyes:  fundi examined but not visualized Neck: supple, no paraspinal tenderness, full range of motion Back: No paraspinal tenderness Heart: regular rate and rhythm Lungs: Clear to auscultation bilaterally. Vascular: No carotid bruits. Neurological Exam: Mental status: alert and oriented to person, place, and time, recent  and remote memory intact, fund of knowledge intact, attention and concentration intact, speech fluent and not dysarthric, language intact. Cranial nerves: CN I: not tested CN II: pupils equal, round and reactive to light, visual fields intact CN III, IV, VI:  full range of motion, no nystagmus, no ptosis CN V: facial sensation intact. CN VII: upper and lower face symmetric CN VIII: hearing intact CN IX, X: gag intact, uvula midline CN XI: sternocleidomastoid and trapezius muscles intact CN XII: tongue midline Bulk & Tone: normal, no fasciculations. Motor:  muscle strength 5/5 throughout Sensation:  Pinprick, temperature and vibratory sensation intact. Deep Tendon Reflexes:  2+ throughout,  toes downgoing.   Finger to nose testing:  Without dysmetria.   Heel to shin:  Without dysmetria.   Gait:  Normal station and stride.  Romberg negative.    Thank you for allowing me to take part in the care of this patient.  Metta Clines, DO  CC: ***

## 2021-04-11 ENCOUNTER — Ambulatory Visit: Payer: Medicare Other | Admitting: Neurology

## 2021-04-15 DIAGNOSIS — F411 Generalized anxiety disorder: Secondary | ICD-10-CM | POA: Diagnosis not present

## 2021-04-15 DIAGNOSIS — R4587 Impulsiveness: Secondary | ICD-10-CM | POA: Diagnosis not present

## 2021-04-15 DIAGNOSIS — F4310 Post-traumatic stress disorder, unspecified: Secondary | ICD-10-CM | POA: Diagnosis not present

## 2021-04-15 DIAGNOSIS — Z79899 Other long term (current) drug therapy: Secondary | ICD-10-CM | POA: Diagnosis not present

## 2021-04-20 ENCOUNTER — Encounter: Payer: Self-pay | Admitting: Family Medicine

## 2021-04-20 ENCOUNTER — Telehealth: Payer: Self-pay | Admitting: Family Medicine

## 2021-04-20 ENCOUNTER — Telehealth (INDEPENDENT_AMBULATORY_CARE_PROVIDER_SITE_OTHER): Payer: Medicare Other | Admitting: Family Medicine

## 2021-04-20 ENCOUNTER — Ambulatory Visit
Admission: RE | Admit: 2021-04-20 | Discharge: 2021-04-20 | Disposition: A | Discharge status: Home / Self Care | Payer: Medicare Other | Source: Ambulatory Visit | Attending: Family Medicine | Admitting: Family Medicine

## 2021-04-20 ENCOUNTER — Other Ambulatory Visit
Admission: RE | Admit: 2021-04-20 | Discharge: 2021-04-20 | Disposition: A | Discharge status: Home / Self Care | Payer: Medicare Other | Source: Home / Self Care | Attending: Family Medicine | Admitting: Family Medicine

## 2021-04-20 ENCOUNTER — Telehealth: Payer: Medicare Other | Admitting: Family Medicine

## 2021-04-20 ENCOUNTER — Ambulatory Visit
Admission: RE | Admit: 2021-04-20 | Discharge: 2021-04-20 | Disposition: A | Discharge status: Home / Self Care | Payer: Medicare Other | Attending: Family Medicine | Admitting: Family Medicine

## 2021-04-20 VITALS — Ht 68.0 in | Wt 202.0 lb

## 2021-04-20 DIAGNOSIS — R079 Chest pain, unspecified: Secondary | ICD-10-CM

## 2021-04-20 DIAGNOSIS — R5383 Other fatigue: Secondary | ICD-10-CM | POA: Insufficient documentation

## 2021-04-20 DIAGNOSIS — R0602 Shortness of breath: Secondary | ICD-10-CM | POA: Diagnosis not present

## 2021-04-20 LAB — COMPREHENSIVE METABOLIC PANEL
ALT: 53 U/L — ABNORMAL HIGH (ref 0–44)
AST: 34 U/L (ref 15–41)
Albumin: 4.3 g/dL (ref 3.5–5.0)
Alkaline Phosphatase: 66 U/L (ref 38–126)
Anion gap: 12 (ref 5–15)
BUN: 15 mg/dL (ref 6–20)
CO2: 22 mmol/L (ref 22–32)
Calcium: 9.7 mg/dL (ref 8.9–10.3)
Chloride: 105 mmol/L (ref 98–111)
Creatinine, Ser: 0.87 mg/dL (ref 0.61–1.24)
GFR, Estimated: >60 mL/min (ref >60)
Glucose, Bld: 127 mg/dL — ABNORMAL HIGH (ref 70–99)
Potassium: 3.7 mmol/L (ref 3.5–5.1)
Sodium: 139 mmol/L (ref 135–145)
Total Bilirubin: 0.5 mg/dL (ref 0.3–1.2)
Total Protein: 6.8 g/dL (ref 6.5–8.1)

## 2021-04-20 LAB — CBC WITH DIFFERENTIAL/PLATELET
Abs Immature Granulocytes: 0.06 10*3/uL (ref 0.00–0.07)
Basophils Absolute: 0.1 10*3/uL (ref 0.0–0.1)
Basophils Relative: 1 %
Eosinophils Absolute: 0.9 10*3/uL — ABNORMAL HIGH (ref 0.0–0.5)
Eosinophils Relative: 6 %
HCT: 46.8 % (ref 39.0–52.0)
Hemoglobin: 15.8 g/dL (ref 13.0–17.0)
Immature Granulocytes: 0 %
Lymphocytes Relative: 17 %
Lymphs Abs: 2.5 10*3/uL (ref 0.7–4.0)
MCH: 31.8 pg (ref 26.0–34.0)
MCHC: 33.8 g/dL (ref 30.0–36.0)
MCV: 94.2 fL (ref 80.0–100.0)
Monocytes Absolute: 0.8 10*3/uL (ref 0.1–1.0)
Monocytes Relative: 6 %
Neutro Abs: 10.5 10*3/uL — ABNORMAL HIGH (ref 1.7–7.7)
Neutrophils Relative %: 70 %
Platelets: 221 10*3/uL (ref 150–400)
RBC: 4.97 MIL/uL (ref 4.22–5.81)
RDW: 12.7 % (ref 11.5–15.5)
WBC: 14.8 10*3/uL — ABNORMAL HIGH (ref 4.0–10.5)
nRBC: 0 % (ref 0.0–0.2)

## 2021-04-20 LAB — TROPONIN I (HIGH SENSITIVITY): Troponin I (High Sensitivity): 3 ng/L (ref <18)

## 2021-04-20 LAB — D-DIMER, QUANTITATIVE: D-Dimer, Quant: <0.27 ug/mL-FEU (ref 0.00–0.50)

## 2021-04-20 NOTE — Assessment & Plan Note (Signed)
Symptoms have been going on several weeks.  He does not appear to be in any acute distress at this time.  Discussed that the differential is quite wide regarding his symptoms.  Prior to his visit it was relayed that he had congestion though he denies that during the visit.  Discussed starting a work-up with a chest x-ray and labs at the [facility] mall.  I initially discussed that if he had prolonged recurrent symptoms longer than a minute he should be evaluated in the emergency department though at the end of the visit I did advise that if he had any recurrent symptoms he needs to call 911 to get evaluated in the ED.  He noted he was not going to drive and he would have his parents transport him for the chest x-ray and labs. ?

## 2021-04-20 NOTE — Telephone Encounter (Signed)
Please call the patient later this afternoon to check on him and see if he was able to go get labs and an x-ray.  See if he has had any recurrent symptoms and if so he needs to go to the emergency department.  Thanks. ?

## 2021-04-20 NOTE — Progress Notes (Signed)
? ?Virtual Visit via video note ? ?This visit type was conducted due to national recommendations for restrictions regarding the COVID-19 pandemic (e.g. social distancing).  This format is felt to be most appropriate for this patient at this time.  All issues noted in this document were discussed and addressed.  No physical exam was performed (except for noted visual exam findings with Video Visits).  ? ?I connected with Michael [name] today at  1:30 PM EST by a video enabled telemedicine application and verified that I am speaking with the correct person using two identifiers. ?Location patient: home ?Location provider: work  ?Persons participating in the virtual visit: patient, provider ? ?I discussed the limitations, risks, security and privacy concerns of performing an evaluation and management service by telephone and the availability of in person appointments. I also discussed with the patient that there may be a patient responsible charge related to this service. The patient expressed understanding and agreed to proceed. ? ?Reason for visit: Same-day visit. ? ?HPI: ?Chest pain/shortness of breath: Patient notes on set of this about 3 weeks ago.  He notes a pressure sensation in his chest that is a 5-6/10.  It does not feel like prior lung issues that he has had.  He has noticed that his lips seem a little paler than typical though they have not turned blue.  He notes that shortness of breath comes and goes and typically occurs with the discomfort.  He notes this has been going on daily.  It occurs multiple times and has lasted up to 30 minutes.  He also notes a bump on his left lower chest.  He notes no fevers or productive cough.  He notes no change to his chronic cough.  He notes some palpitations.  No wheezing.  No sinus congestion.  No chest congestion.  He notes the symptoms occur whether he is sitting or standing or exerting himself.  At times he has no symptoms when exerting himself.  He has had fatigue  and no energy.  He took several COVID test today.  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  He does not have any equipment to take vitals. ? ? ?ROS: See pertinent positives and negatives per HPI. ? ?Past Medical History:  ?Diagnosis Date  ? Allergy   ? Anal fissure 04/06/2017  ? Anxiety   ? Asthma   ? Chest pain off and on  ? COVID-19   ? 02/2020  ? Depression   ? Family history of adverse reaction to anesthesia   ? adopted history unknown  ? GERD (gastroesophageal reflux disease)   ? Pancreatitis last 2015  ? Substance abuse ([place])   ? per pt heroin/cocaine age [age]   ? ? ?Past Surgical History:  ?Procedure Laterality Date  ? CHOLECYSTECTOMY  08/16/2015  ? Procedure: LAPAROSCOPIC CHOLECYSTECTOMY;  Surgeon: Alphonsa Overall, MD;  Location: [facility];  Service: General;;  ? ESOPHAGOGASTRODUODENOSCOPY (EGD) WITH PROPOFOL N/A 07/27/2016  ? Procedure: ESOPHAGOGASTRODUODENOSCOPY (EGD) WITH PROPOFOL;  Surgeon: Milus Banister, MD;  Location: [facility];  Service: Endoscopy;  Laterality: N/A;  ? ESOPHAGOGASTRODUODENOSCOPY (EGD) WITH PROPOFOL N/A 10/10/2017  ? Procedure: ESOPHAGOGASTRODUODENOSCOPY (EGD) WITH PROPOFOL;  Surgeon: Virgel Manifold, MD;  Location: [facility];  Service: Endoscopy;  Laterality: N/A;  ? TONSILLECTOMY AND ADENOIDECTOMY  as child  ? and adenoids  ? ? ?Family History  ?Adopted: Yes  ?Problem Relation Age of Onset  ? Diabetes Paternal Grandfather   ?  Lung cancer Paternal Grandfather   ? Ulcers Father   ? Colon cancer Neg Hx   ? ? ?SOCIAL HX: Smoker ? ? ?Current Outpatient Medications:  ?  albuterol (VENTOLIN HFA) 108 (90 Base) MCG/ACT inhaler, Inhale 2 puffs into the lungs every 6 (six) hours as needed for wheezing or shortness of breath., Disp: 18 g, Rfl: 11 ?  ARIPiprazole (ABILIFY) 10 MG tablet, Take 10 mg by mouth every morning., Disp: , Rfl:  ?  clonazePAM (KLONOPIN) 0.5 MG disintegrating tablet, Take 0.5 mg by mouth 3 (three) times daily as needed., Disp: ,  Rfl:  ?  doxycycline (VIBRA-TABS) 100 MG tablet, Take 1 tablet (100 mg total) by mouth 2 (two) times daily., Disp: 14 tablet, Rfl: 0 ?  eszopiclone (LUNESTA) 1 MG TABS tablet, Take 1 mg by mouth at bedtime., Disp: , Rfl:  ?  famotidine-calcium carbonate-magnesium hydroxide (PEPCID COMPLETE) 10-800-165 MG chewable tablet, Chew 1 tablet by mouth daily as needed., Disp: , Rfl:  ?  Fluticasone-Umeclidin-Vilant (TRELEGY ELLIPTA) 200-62.5-25 MCG/INH AEPB, Inhale 1 puff into the lungs daily. Rinse mouth, Disp: 60 each, Rfl: 11 ?  hydrOXYzine (ATARAX/VISTARIL) 50 MG tablet, Take 50 mg by mouth at bedtime., Disp: , Rfl:  ?  levofloxacin (LEVAQUIN) 500 MG tablet, Take 1 tablet (500 mg total) by mouth daily., Disp: 7 tablet, Rfl: 0 ?  loperamide (IMODIUM A-D) 2 MG tablet, Take 2 tablets (4 mg total) by mouth 4 (four) times daily as needed for diarrhea or loose stools., Disp: 30 tablet, Rfl: 0 ?  omeprazole (PRILOSEC) 40 MG capsule, TAKE 1 CAPSULE BY MOUTH ONCE DAILY, Disp: 30 capsule, Rfl: 2 ?  prazosin (MINIPRESS) 5 MG capsule, Take 5 mg by mouth at bedtime., Disp: , Rfl:  ?  propranolol ER (INDERAL LA) 60 MG 24 hr capsule, SMARTSIG:1 Capsule(s) By Mouth Every Evening, Disp: , Rfl:  ?  Skin Protectants, Misc. (CERAVE) OINT, Apply 1 application topically daily as needed (itching)., Disp: , Rfl:  ?  traMADol (ULTRAM) 50 MG tablet, Take 1 tablet (50 mg total) by mouth every 6 (six) hours as needed for moderate pain., Disp: 12 tablet, Rfl: 0 ?  triamcinolone cream (KENALOG) 0.1 %, Apply 1 application topically 2 (two) times daily as needed., Disp: 80 g, Rfl: 2 ?  cloNIDine (CATAPRES) 0.1 MG tablet, SMARTSIG:1 Tablet(s) By Mouth Every Evening, Disp: , Rfl:  ?  diazepam (VALIUM) 2 MG tablet, Take 8 mg by mouth daily., Disp: , Rfl:  ?  dicyclomine (BENTYL) 20 MG tablet, Take 1 tablet (20 mg total) by mouth every 6 (six) hours for 5 days., Disp: 20 tablet, Rfl: 0 ? ?EXAM: ? ?VITALS per patient if applicable: ? ?GENERAL: alert,  oriented, appears well and in no acute distress ? ?HEENT: atraumatic, conjunttiva clear, no obvious abnormalities on inspection of external nose and ears ? ?NECK: normal movements of the head and neck ? ?LUNGS: on inspection no signs of respiratory distress, breathing rate appears normal, no obvious gross SOB, gasping or wheezing ? ?CV: no obvious cyanosis ? ?MS: moves all visible extremities without noticeable abnormality ? ?PSYCH/NEURO: pleasant and cooperative, no obvious depression or anxiety, speech and thought processing grossly intact ? ?ASSESSMENT AND PLAN: ? ?Discussed the following assessment and plan: ? ?Problem List Items Addressed This Visit   ? ? Chest pain - Primary  ?  Symptoms have been going on several weeks.  He does not appear to be in any acute distress at this time.  Discussed that the  differential is quite wide regarding his symptoms.  Prior to his visit it was relayed that he had congestion though he denies that during the visit.  Discussed starting a work-up with a chest x-ray and labs at the medical mall.  I initially discussed that if he had prolonged recurrent symptoms longer than a minute he should be evaluated in the emergency department though at the end of the visit I did advise that if he had any recurrent symptoms he needs to call 911 to get evaluated in the ED.  He noted he was not going to drive and he would have his parents transport him for the chest x-ray and labs. ?  ?  ? Relevant Orders  ? CBC w/Diff  ? Comp Met (CMET)  ? D-Dimer, Quantitative  ? Troponin I (High Sensitivity)  ? DG Chest 2 View  ? ?Other Visit Diagnoses   ? ? Other fatigue      ? Relevant Orders  ? Troponin I (High Sensitivity)  ? ?  ? ? ?No follow-ups on file. ?  ?I discussed the assessment and treatment plan with the patient. The patient was provided an opportunity to ask questions and all were answered. The patient agreed with the plan and demonstrated an understanding of the instructions. ?  ?The patient  was advised to call back or seek an in-person evaluation if the symptoms worsen or if the condition fails to improve as anticipated. ? ? ?Tommi Rumps, MD  ? ?

## 2021-04-20 NOTE — Telephone Encounter (Signed)
I called the patient and he stated he had a friend to take him and he did get the labs and xray done today.  I informed him that if his symptoms get worse to go to the Er and he understood. Nikia Levels,cma  ?

## 2021-04-20 NOTE — Telephone Encounter (Signed)
Noted. We can plan to see him virtually this afternoon.  ?

## 2021-04-21 NOTE — Telephone Encounter (Signed)
Noted. Please see the result note for this patient.  ?

## 2021-04-25 NOTE — Telephone Encounter (Signed)
Please call the patient and see if he got my mychart message. He needs to be seen in person for his symptoms and I advise he go to the ED.  ?

## 2021-04-29 ENCOUNTER — Inpatient Hospital Stay: Payer: Medicare Other | Admitting: Anesthesiology

## 2021-04-29 ENCOUNTER — Encounter: Payer: Self-pay | Admitting: Family Medicine

## 2021-04-29 ENCOUNTER — Other Ambulatory Visit: Payer: Self-pay

## 2021-04-29 ENCOUNTER — Encounter: Payer: Self-pay | Admitting: Emergency Medicine

## 2021-04-29 ENCOUNTER — Ambulatory Visit
Admission: RE | Admit: 2021-04-29 | Discharge: 2021-04-29 | Disposition: A | Discharge status: Home / Self Care | Payer: Medicare Other | Source: Ambulatory Visit | Attending: Family Medicine | Admitting: Family Medicine

## 2021-04-29 ENCOUNTER — Observation Stay
Admission: EM | Admit: 2021-04-29 | Discharge: 2021-04-30 | Disposition: A | Discharge status: Home / Self Care | Payer: Medicare Other | Attending: Surgery | Admitting: Surgery

## 2021-04-29 ENCOUNTER — Telehealth: Payer: Self-pay | Admitting: Family Medicine

## 2021-04-29 ENCOUNTER — Ambulatory Visit (INDEPENDENT_AMBULATORY_CARE_PROVIDER_SITE_OTHER): Payer: Medicare Other | Admitting: Family Medicine

## 2021-04-29 ENCOUNTER — Encounter
Admission: EM | Disposition: A | Discharge status: Home / Self Care | Payer: Self-pay | Source: Home / Self Care | Attending: Emergency Medicine

## 2021-04-29 VITALS — BP 140/70 | HR 93 | Temp 97.8°F | Ht 67.0 in | Wt 209.8 lb

## 2021-04-29 DIAGNOSIS — R140 Abdominal distension (gaseous): Secondary | ICD-10-CM | POA: Insufficient documentation

## 2021-04-29 DIAGNOSIS — K388 Other specified diseases of appendix: Secondary | ICD-10-CM | POA: Diagnosis not present

## 2021-04-29 DIAGNOSIS — I10 Essential (primary) hypertension: Secondary | ICD-10-CM | POA: Insufficient documentation

## 2021-04-29 DIAGNOSIS — R7989 Other specified abnormal findings of blood chemistry: Secondary | ICD-10-CM | POA: Insufficient documentation

## 2021-04-29 DIAGNOSIS — R1084 Generalized abdominal pain: Secondary | ICD-10-CM

## 2021-04-29 DIAGNOSIS — J029 Acute pharyngitis, unspecified: Secondary | ICD-10-CM | POA: Insufficient documentation

## 2021-04-29 DIAGNOSIS — D72829 Elevated white blood cell count, unspecified: Secondary | ICD-10-CM | POA: Insufficient documentation

## 2021-04-29 DIAGNOSIS — K449 Diaphragmatic hernia without obstruction or gangrene: Secondary | ICD-10-CM | POA: Diagnosis not present

## 2021-04-29 DIAGNOSIS — Z20822 Contact with and (suspected) exposure to covid-19: Secondary | ICD-10-CM | POA: Insufficient documentation

## 2021-04-29 DIAGNOSIS — J45909 Unspecified asthma, uncomplicated: Secondary | ICD-10-CM | POA: Diagnosis not present

## 2021-04-29 DIAGNOSIS — F419 Anxiety disorder, unspecified: Secondary | ICD-10-CM | POA: Diagnosis not present

## 2021-04-29 DIAGNOSIS — R112 Nausea with vomiting, unspecified: Secondary | ICD-10-CM | POA: Insufficient documentation

## 2021-04-29 DIAGNOSIS — R1011 Right upper quadrant pain: Secondary | ICD-10-CM

## 2021-04-29 DIAGNOSIS — Z8616 Personal history of COVID-19: Secondary | ICD-10-CM | POA: Diagnosis not present

## 2021-04-29 DIAGNOSIS — R609 Edema, unspecified: Secondary | ICD-10-CM | POA: Insufficient documentation

## 2021-04-29 DIAGNOSIS — R197 Diarrhea, unspecified: Secondary | ICD-10-CM | POA: Diagnosis not present

## 2021-04-29 DIAGNOSIS — K5900 Constipation, unspecified: Secondary | ICD-10-CM | POA: Diagnosis not present

## 2021-04-29 DIAGNOSIS — F1921 Other psychoactive substance dependence, in remission: Secondary | ICD-10-CM | POA: Diagnosis not present

## 2021-04-29 DIAGNOSIS — R0789 Other chest pain: Secondary | ICD-10-CM | POA: Insufficient documentation

## 2021-04-29 DIAGNOSIS — K353 Acute appendicitis with localized peritonitis, without perforation or gangrene: Secondary | ICD-10-CM | POA: Diagnosis not present

## 2021-04-29 DIAGNOSIS — F319 Bipolar disorder, unspecified: Secondary | ICD-10-CM | POA: Insufficient documentation

## 2021-04-29 DIAGNOSIS — R053 Chronic cough: Secondary | ICD-10-CM | POA: Insufficient documentation

## 2021-04-29 DIAGNOSIS — Z9049 Acquired absence of other specified parts of digestive tract: Secondary | ICD-10-CM | POA: Diagnosis not present

## 2021-04-29 DIAGNOSIS — R109 Unspecified abdominal pain: Secondary | ICD-10-CM | POA: Diagnosis present

## 2021-04-29 DIAGNOSIS — R1031 Right lower quadrant pain: Secondary | ICD-10-CM | POA: Insufficient documentation

## 2021-04-29 DIAGNOSIS — Z7951 Long term (current) use of inhaled steroids: Secondary | ICD-10-CM | POA: Insufficient documentation

## 2021-04-29 DIAGNOSIS — Z87891 Personal history of nicotine dependence: Secondary | ICD-10-CM | POA: Insufficient documentation

## 2021-04-29 DIAGNOSIS — K37 Unspecified appendicitis: Secondary | ICD-10-CM

## 2021-04-29 DIAGNOSIS — K3580 Unspecified acute appendicitis: Secondary | ICD-10-CM | POA: Diagnosis not present

## 2021-04-29 DIAGNOSIS — Z9109 Other allergy status, other than to drugs and biological substances: Secondary | ICD-10-CM | POA: Diagnosis not present

## 2021-04-29 DIAGNOSIS — K36 Other appendicitis: Secondary | ICD-10-CM | POA: Diagnosis not present

## 2021-04-29 DIAGNOSIS — F17210 Nicotine dependence, cigarettes, uncomplicated: Secondary | ICD-10-CM | POA: Insufficient documentation

## 2021-04-29 DIAGNOSIS — Z8719 Personal history of other diseases of the digestive system: Secondary | ICD-10-CM | POA: Diagnosis not present

## 2021-04-29 HISTORY — PX: XI ROBOTIC LAPAROSCOPIC ASSISTED APPENDECTOMY: SHX6877

## 2021-04-29 LAB — CBC WITH DIFFERENTIAL/PLATELET
Abs Immature Granulocytes: 0.07 10*3/uL (ref 0.00–0.07)
Basophils Absolute: 0.1 10*3/uL (ref 0.0–0.1)
Basophils Absolute: 0.1 10*3/uL (ref 0.0–0.1)
Basophils Relative: 1 % (ref 0.0–3.0)
Basophils Relative: 1 %
Eosinophils Absolute: 0.7 10*3/uL (ref 0.0–0.7)
Eosinophils Absolute: 0.8 10*3/uL — ABNORMAL HIGH (ref 0.0–0.5)
Eosinophils Relative: 8.7 % — ABNORMAL HIGH (ref 0.0–5.0)
Eosinophils Relative: 8 %
HCT: 45.8 % (ref 39.0–52.0)
HCT: 48.1 % (ref 39.0–52.0)
Hemoglobin: 15.8 g/dL (ref 13.0–17.0)
Hemoglobin: 16.3 g/dL (ref 13.0–17.0)
Immature Granulocytes: 1 %
Lymphocytes Relative: 27 % (ref 12.0–46.0)
Lymphocytes Relative: 32 %
Lymphs Abs: 2.2 10*3/uL (ref 0.7–4.0)
Lymphs Abs: 3.1 10*3/uL (ref 0.7–4.0)
MCH: 31.6 pg (ref 26.0–34.0)
MCHC: 34.6 g/dL (ref 30.0–36.0)
MCHC: 33.9 g/dL (ref 30.0–36.0)
MCV: 95.4 fl (ref 78.0–100.0)
MCV: 93.2 fL (ref 80.0–100.0)
Monocytes Absolute: 0.7 10*3/uL (ref 0.1–1.0)
Monocytes Absolute: 1 10*3/uL (ref 0.1–1.0)
Monocytes Relative: 8.7 % (ref 3.0–12.0)
Monocytes Relative: 10 %
Neutro Abs: 4.5 10*3/uL (ref 1.4–7.7)
Neutro Abs: 4.7 10*3/uL (ref 1.7–7.7)
Neutrophils Relative %: 54.6 % (ref 43.0–77.0)
Neutrophils Relative %: 48 %
Platelets: 211 10*3/uL (ref 150.0–400.0)
Platelets: 221 10*3/uL (ref 150–400)
RBC: 4.8 Mil/uL (ref 4.22–5.81)
RBC: 5.16 MIL/uL (ref 4.22–5.81)
RDW: 13.4 % (ref 11.5–15.5)
RDW: 12.7 % (ref 11.5–15.5)
WBC: 8.2 10*3/uL (ref 4.0–10.5)
WBC: 9.7 10*3/uL (ref 4.0–10.5)
nRBC: 0 % (ref 0.0–0.2)

## 2021-04-29 LAB — COMPREHENSIVE METABOLIC PANEL
ALT: 92 U/L — ABNORMAL HIGH (ref 0–53)
AST: 42 U/L — ABNORMAL HIGH (ref 0–37)
Albumin: 4.8 g/dL (ref 3.5–5.2)
Alkaline Phosphatase: 75 U/L (ref 39–117)
BUN: 14 mg/dL (ref 6–23)
CO2: 26 mEq/L (ref 19–32)
Calcium: 9.9 mg/dL (ref 8.4–10.5)
Chloride: 100 mEq/L (ref 96–112)
Creatinine, Ser: 0.95 mg/dL (ref 0.40–1.50)
GFR: 107.56 mL/min (ref >60.00)
Glucose, Bld: 94 mg/dL (ref 70–99)
Potassium: 4.4 mEq/L (ref 3.5–5.1)
Sodium: 135 mEq/L (ref 135–145)
Total Bilirubin: 0.4 mg/dL (ref 0.2–1.2)
Total Protein: 7.3 g/dL (ref 6.0–8.3)

## 2021-04-29 LAB — RESP PANEL BY RT-PCR (FLU A&B, COVID) ARPGX2
Influenza A by PCR: NEGATIVE
Influenza B by PCR: NEGATIVE
SARS Coronavirus 2 by RT PCR: NEGATIVE

## 2021-04-29 LAB — BASIC METABOLIC PANEL
Anion gap: 12 (ref 5–15)
BUN: 13 mg/dL (ref 6–20)
CO2: 25 mmol/L (ref 22–32)
Calcium: 9.4 mg/dL (ref 8.9–10.3)
Chloride: 98 mmol/L (ref 98–111)
Creatinine, Ser: 0.95 mg/dL (ref 0.61–1.24)
GFR, Estimated: >60 mL/min (ref >60)
Glucose, Bld: 93 mg/dL (ref 70–99)
Potassium: 4 mmol/L (ref 3.5–5.1)
Sodium: 135 mmol/L (ref 135–145)

## 2021-04-29 LAB — POCT RAPID STREP A (OFFICE): Rapid Strep A Screen: NEGATIVE

## 2021-04-29 LAB — URINE DRUG SCREEN, QUALITATIVE (ARMC ONLY)
Amphetamines, Ur Screen: NOT DETECTED
Barbiturates, Ur Screen: NOT DETECTED
Benzodiazepine, Ur Scrn: POSITIVE — AB
Cannabinoid 50 Ng, Ur ~~LOC~~: NOT DETECTED
Cocaine Metabolite,Ur ~~LOC~~: NOT DETECTED
MDMA (Ecstasy)Ur Screen: NOT DETECTED
Methadone Scn, Ur: NOT DETECTED
Opiate, Ur Screen: NOT DETECTED
Phencyclidine (PCP) Ur S: NOT DETECTED
Tricyclic, Ur Screen: NOT DETECTED

## 2021-04-29 SURGERY — APPENDECTOMY, ROBOT-ASSISTED, LAPAROSCOPIC
Anesthesia: General | Site: Abdomen

## 2021-04-29 MED ORDER — PRAZOSIN HCL 5 MG PO CAPS
5.0000 mg | ORAL_CAPSULE | Freq: Every day | ORAL | Status: DC
  Administered 2021-04-29: 5 mg via ORAL
  Filled 2021-04-29 (×2): qty 1

## 2021-04-29 MED ORDER — ONDANSETRON HCL 4 MG/2ML IJ SOLN
INTRAMUSCULAR | Status: AC
  Filled 2021-04-29: qty 2

## 2021-04-29 MED ORDER — DULOXETINE HCL 30 MG PO CPEP
60.0000 mg | ORAL_CAPSULE | Freq: Two times a day (BID) | ORAL | Status: DC
  Filled 2021-04-29 (×2): qty 2

## 2021-04-29 MED ORDER — MIDAZOLAM HCL 2 MG/2ML IJ SOLN
INTRAMUSCULAR | Status: DC | PRN
  Administered 2021-04-29: 2 mg via INTRAVENOUS

## 2021-04-29 MED ORDER — 0.9 % SODIUM CHLORIDE (POUR BTL) OPTIME
TOPICAL | Status: DC | PRN
Start: 2021-04-29 — End: 2021-04-29
  Administered 2021-04-29: 200 mL

## 2021-04-29 MED ORDER — DEXMEDETOMIDINE HCL IN NACL 200 MCG/50ML IV SOLN
INTRAVENOUS | Status: DC | PRN
  Administered 2021-04-29: 12 ug via INTRAVENOUS
  Administered 2021-04-29 (×2): 8 ug via INTRAVENOUS
  Administered 2021-04-29: 12 ug via INTRAVENOUS
  Administered 2021-04-29: 8 ug via INTRAVENOUS

## 2021-04-29 MED ORDER — DIAZEPAM 5 MG PO TABS
5.0000 mg | ORAL_TABLET | Freq: Every day | ORAL | Status: DC
  Administered 2021-04-30: 5 mg via ORAL
  Filled 2021-04-29: qty 1

## 2021-04-29 MED ORDER — CLONAZEPAM 0.25 MG PO TBDP
0.5000 mg | ORAL_TABLET | Freq: Once | ORAL | Status: AC
  Administered 2021-04-29: 0.5 mg via ORAL
  Filled 2021-04-29: qty 2

## 2021-04-29 MED ORDER — TRAMADOL HCL 50 MG PO TABS
50.0000 mg | ORAL_TABLET | Freq: Four times a day (QID) | ORAL | Status: DC | PRN
  Administered 2021-04-29 – 2021-04-30 (×2): 50 mg via ORAL
  Filled 2021-04-29: qty 1

## 2021-04-29 MED ORDER — ONDANSETRON 4 MG PO TBDP: 4.0000 mg | ORAL_TABLET | Freq: Four times a day (QID) | ORAL | Status: DC | PRN

## 2021-04-29 MED ORDER — ROCURONIUM BROMIDE 100 MG/10ML IV SOLN
INTRAVENOUS | Status: DC | PRN
  Administered 2021-04-29: 70 mg via INTRAVENOUS

## 2021-04-29 MED ORDER — NICOTINE 14 MG/24HR TD PT24: 14.0000 mg | MEDICATED_PATCH | Freq: Every day | TRANSDERMAL | Status: DC

## 2021-04-29 MED ORDER — METRONIDAZOLE 500 MG/100ML IV SOLN: 500.0000 mg | Freq: Once | INTRAVENOUS | Status: DC

## 2021-04-29 MED ORDER — ACETAMINOPHEN 500 MG PO TABS
1000.0000 mg | ORAL_TABLET | Freq: Four times a day (QID) | ORAL | Status: DC
  Administered 2021-04-29 – 2021-04-30 (×3): 1000 mg via ORAL
  Filled 2021-04-29 (×3): qty 2

## 2021-04-29 MED ORDER — PANTOPRAZOLE SODIUM 40 MG PO TBEC
40.0000 mg | DELAYED_RELEASE_TABLET | Freq: Every day | ORAL | Status: DC
Start: 2021-04-30 — End: 2021-04-30
  Administered 2021-04-30: 40 mg via ORAL
  Filled 2021-04-29: qty 1

## 2021-04-29 MED ORDER — NICOTINE 14 MG/24HR TD PT24
14.0000 mg | MEDICATED_PATCH | Freq: Every day | TRANSDERMAL | Status: DC
Start: 2021-04-30 — End: 2021-04-30
  Administered 2021-04-30 (×2): 14 mg via TRANSDERMAL
  Filled 2021-04-29 (×2): qty 1

## 2021-04-29 MED ORDER — CEFAZOLIN SODIUM-DEXTROSE 2-4 GM/100ML-% IV SOLN
2.0000 g | Freq: Once | INTRAVENOUS | Status: AC
  Administered 2021-04-29: 2 g via INTRAVENOUS
  Filled 2021-04-29: qty 100

## 2021-04-29 MED ORDER — ENOXAPARIN SODIUM 60 MG/0.6ML IJ SOSY
0.5000 mg/kg | PREFILLED_SYRINGE | INTRAMUSCULAR | Status: DC
  Administered 2021-04-30: 47.5 mg via SUBCUTANEOUS
  Filled 2021-04-29: qty 0.6

## 2021-04-29 MED ORDER — KETOROLAC TROMETHAMINE 30 MG/ML IJ SOLN
INTRAMUSCULAR | Status: DC | PRN
  Administered 2021-04-29: 30 mg via INTRAVENOUS

## 2021-04-29 MED ORDER — KETOROLAC TROMETHAMINE 30 MG/ML IJ SOLN: 30.0000 mg | Freq: Four times a day (QID) | INTRAMUSCULAR | Status: DC | PRN

## 2021-04-29 MED ORDER — FENTANYL CITRATE (PF) 100 MCG/2ML IJ SOLN
INTRAMUSCULAR | Status: DC | PRN
  Administered 2021-04-29: 50 ug via INTRAVENOUS
  Administered 2021-04-29: 100 ug via INTRAVENOUS

## 2021-04-29 MED ORDER — DULOXETINE HCL 20 MG PO CPEP
20.0000 mg | ORAL_CAPSULE | Freq: Two times a day (BID) | ORAL | Status: DC
  Filled 2021-04-29 (×2): qty 1

## 2021-04-29 MED ORDER — ONDANSETRON HCL 4 MG/2ML IJ SOLN
4.0000 mg | Freq: Four times a day (QID) | INTRAMUSCULAR | Status: DC | PRN
Start: 2021-04-29 — End: 2021-04-30

## 2021-04-29 MED ORDER — DIAZEPAM 5 MG PO TABS
10.0000 mg | ORAL_TABLET | Freq: Every day | ORAL | Status: DC
  Administered 2021-04-29: 10 mg via ORAL
  Filled 2021-04-29: qty 2

## 2021-04-29 MED ORDER — IOHEXOL 300 MG/ML  SOLN
100.0000 mL | Freq: Once | INTRAMUSCULAR | Status: AC | PRN
  Administered 2021-04-29: 100 mL via INTRAVENOUS

## 2021-04-29 MED ORDER — APREPITANT 40 MG PO CAPS
40.0000 mg | ORAL_CAPSULE | Freq: Once | ORAL | Status: AC
  Administered 2021-04-29: 40 mg via ORAL

## 2021-04-29 MED ORDER — CLONIDINE HCL 0.1 MG PO TABS: 0.1000 mg | ORAL_TABLET | Freq: Every evening | ORAL | Status: DC

## 2021-04-29 MED ORDER — LACTATED RINGERS IV SOLN: INTRAVENOUS | Status: DC | PRN

## 2021-04-29 MED ORDER — TRAMADOL HCL 50 MG PO TABS
ORAL_TABLET | ORAL | Status: AC
  Filled 2021-04-29: qty 1

## 2021-04-29 MED ORDER — KETAMINE HCL 10 MG/ML IJ SOLN
INTRAMUSCULAR | Status: DC | PRN
  Administered 2021-04-29: 20 mg via INTRAVENOUS
  Administered 2021-04-29: 30 mg via INTRAVENOUS

## 2021-04-29 MED ORDER — LIDOCAINE HCL (CARDIAC) PF 100 MG/5ML IV SOSY
PREFILLED_SYRINGE | INTRAVENOUS | Status: DC | PRN
  Administered 2021-04-29: 100 mg via INTRAVENOUS

## 2021-04-29 MED ORDER — LIDOCAINE HCL (PF) 1 % IJ SOLN
INTRAMUSCULAR | Status: DC | PRN
  Administered 2021-04-29: 35 mL via INTRAMUSCULAR

## 2021-04-29 MED ORDER — FAMOTIDINE IN NACL 20-0.9 MG/50ML-% IV SOLN
20.0000 mg | Freq: Once | INTRAVENOUS | Status: AC
Start: 2021-04-29 — End: 2021-04-29
  Administered 2021-04-29: 20 mg via INTRAVENOUS
  Filled 2021-04-29: qty 50

## 2021-04-29 MED ORDER — ACETAMINOPHEN 10 MG/ML IV SOLN
INTRAVENOUS | Status: DC | PRN
  Administered 2021-04-29: 1000 mg via INTRAVENOUS

## 2021-04-29 MED ORDER — FENTANYL CITRATE (PF) 100 MCG/2ML IJ SOLN
INTRAMUSCULAR | Status: AC
  Filled 2021-04-29: qty 2

## 2021-04-29 MED ORDER — PROPOFOL 10 MG/ML IV BOLUS
INTRAVENOUS | Status: DC | PRN
  Administered 2021-04-29: 200 mg via INTRAVENOUS

## 2021-04-29 MED ORDER — ONDANSETRON HCL 4 MG/2ML IJ SOLN
4.0000 mg | Freq: Once | INTRAMUSCULAR | Status: AC | PRN
  Administered 2021-04-29: 4 mg via INTRAVENOUS

## 2021-04-29 MED ORDER — FENTANYL CITRATE (PF) 100 MCG/2ML IJ SOLN: 25.0000 ug | INTRAMUSCULAR | Status: DC | PRN

## 2021-04-29 MED ORDER — ONDANSETRON HCL 4 MG/2ML IJ SOLN
INTRAMUSCULAR | Status: DC | PRN
  Administered 2021-04-29: 4 mg via INTRAVENOUS

## 2021-04-29 MED ORDER — FAMOTIDINE 10 MG PO TABS
10.0000 mg | ORAL_TABLET | Freq: Every day | ORAL | Status: DC | PRN
  Filled 2021-04-29: qty 1

## 2021-04-29 MED ORDER — PRAZOSIN HCL 2 MG PO CAPS
2.0000 mg | ORAL_CAPSULE | Freq: Every day | ORAL | Status: DC
  Administered 2021-04-29: 2 mg via ORAL
  Filled 2021-04-29 (×2): qty 1

## 2021-04-29 MED ORDER — CEFAZOLIN SODIUM-DEXTROSE 1-4 GM/50ML-% IV SOLN: 1.0000 g | Freq: Once | INTRAVENOUS | Status: DC

## 2021-04-29 MED ORDER — SUGAMMADEX SODIUM 200 MG/2ML IV SOLN
INTRAVENOUS | Status: DC | PRN
Start: 2021-04-29 — End: 2021-04-29
  Administered 2021-04-29: 200 mg via INTRAVENOUS

## 2021-04-29 MED ORDER — DEXAMETHASONE SODIUM PHOSPHATE 10 MG/ML IJ SOLN
INTRAMUSCULAR | Status: DC | PRN
  Administered 2021-04-29: 10 mg via INTRAVENOUS

## 2021-04-29 MED ORDER — DIAZEPAM 2 MG PO TABS
2.0000 mg | ORAL_TABLET | Freq: Every day | ORAL | Status: DC
  Administered 2021-04-30: 2 mg via ORAL
  Filled 2021-04-29: qty 1

## 2021-04-29 MED ORDER — ARIPIPRAZOLE 10 MG PO TABS
10.0000 mg | ORAL_TABLET | Freq: Every morning | ORAL | Status: DC
  Administered 2021-04-30: 10 mg via ORAL
  Filled 2021-04-29: qty 1

## 2021-04-29 SURGICAL SUPPLY — 73 items
ANCHOR TIS RET SYS 235ML (MISCELLANEOUS) ×2 IMPLANT
BAG INFUSER PRESSURE 100CC (MISCELLANEOUS) IMPLANT
BAG RETRIEVAL 10 (BASKET) ×1
BLADE SURG SZ11 CARB STEEL (BLADE) ×2 IMPLANT
BULB RESERV EVAC DRAIN JP 100C (MISCELLANEOUS) IMPLANT
CANNULA REDUC XI 12-8 STAPL (CANNULA) ×1
CANNULA REDUCER 12-8 DVNC XI (CANNULA) ×1 IMPLANT
COVER TIP SHEARS 8 DVNC (MISCELLANEOUS) ×1 IMPLANT
COVER TIP SHEARS 8MM DA VINCI (MISCELLANEOUS) ×1
DERMABOND ADVANCED (GAUZE/BANDAGES/DRESSINGS) ×1
DERMABOND ADVANCED .7 DNX12 (GAUZE/BANDAGES/DRESSINGS) ×1 IMPLANT
DRAIN CHANNEL JP 15F RND 16 (MISCELLANEOUS) IMPLANT
DRAPE ARM DVNC X/XI (DISPOSABLE) ×3 IMPLANT
DRAPE COLUMN DVNC XI (DISPOSABLE) ×1 IMPLANT
DRAPE DA VINCI XI ARM (DISPOSABLE) ×3
DRAPE DA VINCI XI COLUMN (DISPOSABLE) ×1
ELECT CAUTERY BLADE 6.4 (BLADE) IMPLANT
ELECT REM PT RETURN 9FT ADLT (ELECTROSURGICAL) ×2
ELECTRODE REM PT RTRN 9FT ADLT (ELECTROSURGICAL) ×1 IMPLANT
GLOVE SURG SYN 6.5 ES PF (GLOVE) ×6 IMPLANT
GLOVE SURG SYN 6.5 PF PI (GLOVE) ×2 IMPLANT
GLOVE SURG UNDER POLY LF SZ7 (GLOVE) ×5 IMPLANT
GOWN STRL REUS W/ TWL LRG LVL3 (GOWN DISPOSABLE) ×3 IMPLANT
GOWN STRL REUS W/TWL LRG LVL3 (GOWN DISPOSABLE) ×3
GRASPER SUT TROCAR 14GX15 (MISCELLANEOUS) ×1 IMPLANT
IRRIGATOR SUCT 8 DISP DVNC XI (IRRIGATION / IRRIGATOR) IMPLANT
IRRIGATOR SUCTION 8MM XI DISP (IRRIGATION / IRRIGATOR)
IV NS 1000ML (IV SOLUTION)
IV NS 1000ML BAXH (IV SOLUTION) IMPLANT
JACKSON PRATT 7MM (INSTRUMENTS) IMPLANT
KIT TURNOVER KIT A (KITS) ×2 IMPLANT
LABEL OR SOLS (LABEL) ×1 IMPLANT
MANIFOLD NEPTUNE II (INSTRUMENTS) ×2 IMPLANT
NDL INSUFFLATION 14GA 120MM (NEEDLE) ×1 IMPLANT
NEEDLE HYPO 22GX1.5 SAFETY (NEEDLE) ×2 IMPLANT
NEEDLE INSUFFLATION 14GA 120MM (NEEDLE) ×2 IMPLANT
OBTURATOR OPTICAL STANDARD 8MM (TROCAR) ×1
OBTURATOR OPTICAL STND 8 DVNC (TROCAR) ×1
OBTURATOR OPTICALSTD 8 DVNC (TROCAR) ×1 IMPLANT
PACK LAP CHOLECYSTECTOMY (MISCELLANEOUS) ×2 IMPLANT
PENCIL ELECTRO HAND CTR (MISCELLANEOUS) ×2 IMPLANT
RELOAD STAPLE 45 2.5 WHT DVNC (STAPLE) IMPLANT
RELOAD STAPLE 45 3.5 BLU DVNC (STAPLE) ×1 IMPLANT
RELOAD STAPLER 2.5X45 WHT DVNC (STAPLE) ×1 IMPLANT
RELOAD STAPLER 3.5X45 BLU DVNC (STAPLE) ×1 IMPLANT
SEAL CANN UNIV 5-8 DVNC XI (MISCELLANEOUS) ×3 IMPLANT
SEAL XI 5MM-8MM UNIVERSAL (MISCELLANEOUS) ×3
SET TUBE SMOKE EVAC HIGH FLOW (TUBING) ×2 IMPLANT
SOLUTION ELECTROLUBE (MISCELLANEOUS) ×2 IMPLANT
STAPLER 45 DA VINCI SURE FORM (STAPLE) ×1
STAPLER 45 SUREFORM DVNC (STAPLE) ×1 IMPLANT
STAPLER CANNULA SEAL DVNC XI (STAPLE) ×1 IMPLANT
STAPLER CANNULA SEAL XI (STAPLE) ×1
STAPLER RELOAD 2.5X45 WHITE (STAPLE) ×1
STAPLER RELOAD 2.5X45 WHT DVNC (STAPLE) ×1
STAPLER RELOAD 3.5X45 BLU DVNC (STAPLE) ×1
STAPLER RELOAD 3.5X45 BLUE (STAPLE) ×1
SUT ETHILON 3-0 FS-10 30 BLK (SUTURE)
SUT MNCRL 4-0 (SUTURE) ×1
SUT MNCRL 4-0 27XMFL (SUTURE) ×1
SUT MNCRL AB 4-0 PS2 18 (SUTURE) ×2 IMPLANT
SUT SILK 3 0 SH 30 (SUTURE) ×1 IMPLANT
SUT SILK 3-0 (SUTURE) IMPLANT
SUT VIC AB 3-0 SH 27 (SUTURE)
SUT VIC AB 3-0 SH 27X BRD (SUTURE) IMPLANT
SUT VICRYL 0 AB UR-6 (SUTURE) ×2 IMPLANT
SUTURE EHLN 3-0 FS-10 30 BLK (SUTURE) IMPLANT
SUTURE MNCRL 4-0 27XMF (SUTURE) IMPLANT
SYR 30ML LL (SYRINGE) ×2 IMPLANT
SYS BAG RETRIEVAL 10MM (BASKET) ×1
SYSTEM BAG RETRIEVAL 10MM (BASKET) IMPLANT
SYSTEM WECK SHIELD CLOSURE (TROCAR) IMPLANT
WATER STERILE IRR 500ML POUR (IV SOLUTION) ×2 IMPLANT

## 2021-04-29 NOTE — Assessment & Plan Note (Signed)
Strep test was negative.  This could potentially be related to what ever is causing his abdominal discomfort. ?

## 2021-04-29 NOTE — Assessment & Plan Note (Addendum)
The patient has developed abdominal pain with abdominal distention and tenderness throughout his abdomen though more so on the right side.  He has an acute abdomen.  He does have guarding.  I am concerned for an intra-abdominal process that is contributing to his symptoms.  We will obtain a CT scan today.  I discussed that with his symptoms it is very likely that the CT scan will reveal something that will require a hospital visit.  Discussed if he had any worsening symptoms prior to getting the CT scan done he would need to go to the emergency department.  There is significant potential for an adverse outcome with his current abdominal exam. ?

## 2021-04-29 NOTE — ED Notes (Signed)
Informed RN bed assigned 

## 2021-04-29 NOTE — Telephone Encounter (Signed)
Michael [name] from [facility]  CT abdomen, ? ? ? ?IMPRESSION: ?The appendix is thickened and enlarged compared to prior exam, but ?there is no surrounding inflammation. These findings are equivocal ?for appendicitis. Correlation with clinical and laboratory data is ?recommended. These results will be called to the ordering clinician ?or representative by the Radiologist Assistant, and communication ?documented in the PACS or zVision Dashboard. ?  ?Stable probable left hepatic hemangioma. ?

## 2021-04-29 NOTE — H&P (Signed)
Subjective:   CC: abdominal pain  HPI:  Michael [name] is a 31 y.o. male who is consulted by [facility] for evaluation of  above cc.  Symptoms were first noted 2 weeks ago. No instigating factor.  Pain is sharp, worsening, diffuse.  Associated with N/V/D, exacerbated by touch.  Still has appetite, but unable to keep anything down.  Reports episodic fevers of 101-102F as well.     Past Medical History:  has a past medical history of Allergy, Anal fissure (04/06/2017), Anxiety, Asthma, Chest pain (off and on), COVID-19, Depression, Family history of adverse reaction to anesthesia, GERD (gastroesophageal reflux disease), Pancreatitis (last 2015), and Substance abuse ([place]).  Past Surgical History:  has a past surgical history that includes Tonsillectomy and adenoidectomy (as child); Cholecystectomy (08/16/2015); Esophagogastroduodenoscopy (egd) with propofol (N/A, 07/27/2016); and Esophagogastroduodenoscopy (egd) with propofol (N/A, 10/10/2017).  Family History: family history includes Diabetes in his paternal grandfather; Lung cancer in his paternal grandfather; Ulcers in his father. He was adopted.  Social History:  reports that he has been smoking cigarettes. He has a 22.50 pack-year smoking history. He quit smokeless tobacco use about 3 years ago.  His smokeless tobacco use included chew. He reports that he does not currently use alcohol after a past usage of about 24.0 standard drinks per week. He reports current drug use. Drug: Marijuana.  Current Medications:  Prior to Admission medications   Medication Sig Start Date End Date Taking? Authorizing Provider  albuterol (VENTOLIN HFA) 108 (90 Base) MCG/ACT inhaler Inhale 2 puffs into the lungs every 6 (six) hours as needed for wheezing or shortness of breath. 07/09/20   McLean-Scocuzza, Nino Glow, MD  ARIPiprazole (ABILIFY) 10 MG tablet Take 10 mg by mouth every morning. 11/23/20   [provider]  clonazePAM (KLONOPIN) 0.5 MG disintegrating  tablet Take 0.5 mg by mouth 3 (three) times daily as needed. 04/09/20   [provider]  cloNIDine (CATAPRES) 0.1 MG tablet SMARTSIG:1 Tablet(s) By Mouth Every Evening 03/30/21   [provider]  diazepam (VALIUM) 2 MG tablet Take 8 mg by mouth daily. 04/07/21   [provider]  dicyclomine (BENTYL) 20 MG tablet Take 1 tablet (20 mg total) by mouth every 6 (six) hours for 5 days. 01/22/21 01/27/21  Carrie Mew, MD  doxycycline (VIBRA-TABS) 100 MG tablet Take 1 tablet (100 mg total) by mouth 2 (two) times daily. 11/03/20   [name], MD  eszopiclone (LUNESTA) 1 MG TABS tablet Take 1 mg by mouth at bedtime. 07/02/20   [provider]  famotidine-calcium carbonate-magnesium hydroxide (PEPCID COMPLETE) 10-800-165 MG chewable tablet Chew 1 tablet by mouth daily as needed.    [provider]  Fluticasone-Umeclidin-Vilant (TRELEGY ELLIPTA) 200-62.5-25 MCG/INH AEPB Inhale 1 puff into the lungs daily. Rinse mouth 07/09/20   McLean-Scocuzza, Nino Glow, MD  hydrOXYzine (ATARAX/VISTARIL) 50 MG tablet Take 50 mg by mouth at bedtime.    [provider]  levofloxacin (LEVAQUIN) 500 MG tablet Take 1 tablet (500 mg total) by mouth daily. 01/31/21   Zara Council A, PA-C  loperamide (IMODIUM A-D) 2 MG tablet Take 2 tablets (4 mg total) by mouth 4 (four) times daily as needed for diarrhea or loose stools. 01/22/21   Carrie Mew, MD  omeprazole (PRILOSEC) 40 MG capsule TAKE 1 CAPSULE BY MOUTH ONCE DAILY 01/14/21   [name], MD  prazosin (MINIPRESS) 5 MG capsule Take 5 mg by mouth at bedtime. 07/02/20   [provider]  propranolol ER (INDERAL  LA) 60 MG 24 hr capsule SMARTSIG:1 Capsule(s) By Mouth Every Evening 09/27/20   [provider]  Skin Protectants, Misc. (CERAVE) OINT Apply 1 application topically daily as needed (itching).    [provider]  traMADol (ULTRAM) 50 MG tablet Take 1 tablet (50 mg total) by mouth every  6 (six) hours as needed for moderate pain. 12/20/20   Stoioff, Ronda Fairly, MD  triamcinolone cream (KENALOG) 0.1 % Apply 1 application topically 2 (two) times daily as needed. 12/31/19   Brendolyn Patty, MD    Allergies:  Allergies as of 04/29/2021 - Review Complete 04/29/2021  Allergen Reaction Noted   Bee venom Anaphylaxis 06/14/2015   Quetiapine Rash 06/21/2015   Serotonin reuptake inhibitors (ssris) Rash 10/09/2014   Amoxicillin Diarrhea 05/04/2014   Gabapentin Nausea And Vomiting 10/09/2014   Prednisone Other (See Comments) 10/09/2014   Anette Guarneri [lurasidone hcl] Rash and Nausea And Vomiting 10/09/2014    ROS:  General: Denies weight loss, weight gain, fatigue, chills, and night sweats. Eyes: Denies blurry vision, double vision, eye pain, itchy eyes, and tearing. Ears: Denies hearing loss, earache, and ringing in ears. Nose: Denies sinus pain, congestion, infections, runny nose, and nosebleeds. Mouth/throat: Denies hoarseness, sore throat, bleeding gums, and difficulty swallowing. Heart: Denies chest pain, palpitations, racing heart, irregular heartbeat, leg pain or swelling, and decreased activity tolerance. Respiratory: Denies breathing difficulty, shortness of breath, wheezing, cough, and sputum. GI: Denies change in appetite, heartburn,  constipation, and blood in stool. GU: Denies difficulty urinating, pain with urinating, urgency, frequency, blood in urine. Musculoskeletal: Denies joint stiffness, pain, swelling, muscle weakness. Skin: Denies rash, itching, mass, tumors, sores, and boils Neurologic: Denies headache, fainting, dizziness, seizures, numbness, and tingling. Psychiatric: Denies depression, anxiety, difficulty sleeping, and memory loss. Endocrine: Denies heat or cold intolerance, and increased thirst or urination. Blood/lymph: Denies easy bruising, easy bruising, and swollen glands     Objective:     BP (!) 167/112 (BP Location: Right Arm)    Pulse 99    Temp 98.3  F (36.8 C) (Oral)    Resp 18    Ht '5\' 8"'$  (1.727 m)    Wt 95.3 kg    SpO2 98%    BMI 31.93 kg/m    Constitutional :  alert, cooperative, appears stated age, and mild distress  Lymphatics/Throat:  no asymmetry, masses, or scars  Respiratory:  clear to auscultation bilaterally  Cardiovascular:  regular rate and rhythm  Gastrointestinal: Soft, with voluntary guarding and extreme anticipatory pain with just light palpation on right , endorses TTP in remaining quadrants as well but able to palpation deeply  also complain of bilateral flank pain   Musculoskeletal: Steady gait and movement  Skin: Cool and moist, lap surgical scars  Psychiatric: Normal affect, non-agitated, not confused       LABS:  CMP Latest Ref Rng & Units 04/29/2021 04/29/2021 04/20/2021  Glucose 70 - 99 mg/dL 93 94 127(H)  BUN 6 - 20 mg/dL '13 14 15  '$ Creatinine 0.61 - 1.24 mg/dL 0.95 0.95 0.87  Sodium 135 - 145 mmol/L 135 135 139  Potassium 3.5 - 5.1 mmol/L 4.0 4.4 3.7  Chloride 98 - 111 mmol/L 98 100 105  CO2 22 - 32 mmol/L '25 26 22  '$ Calcium 8.9 - 10.3 mg/dL 9.4 9.9 9.7  Total Protein 6.0 - 8.3 g/dL - 7.3 6.8  Total Bilirubin 0.2 - 1.2 mg/dL - 0.4 0.5  Alkaline Phos 39 - 117 U/L - 75 66  AST 0 - 37  U/L - 42(H) 34  ALT 0 - 53 U/L - 92(H) 53(H)   CBC Latest Ref Rng & Units 04/29/2021 04/29/2021 04/20/2021  WBC 4.0 - 10.5 K/uL 9.7 8.2 14.8(H)  Hemoglobin 13.0 - 17.0 g/dL 16.3 15.8 15.8  Hematocrit 39.0 - 52.0 % 48.1 45.8 46.8  Platelets 150 - 400 K/uL 221 211.0 221     RADS: CLINICAL DATA:  Acute generalized abdominal pain.   EXAM: CT ABDOMEN AND PELVIS WITH CONTRAST   TECHNIQUE: Multidetector CT imaging of the abdomen and pelvis was performed using the standard protocol following bolus administration of intravenous contrast.   RADIATION DOSE REDUCTION: This exam was performed according to the departmental dose-optimization program which includes automated exposure control, adjustment of the mA and/or kV  according to patient size and/or use of iterative reconstruction technique.   CONTRAST:  133m OMNIPAQUE IOHEXOL 300 MG/ML  SOLN   COMPARISON:  January 22, 2021.   FINDINGS: Lower chest: No acute abnormality.   Hepatobiliary: Stable probable left hepatic hemangioma. Status post cholecystectomy. No biliary dilatation.   Pancreas: Unremarkable. No pancreatic ductal dilatation or surrounding inflammatory changes.   Spleen: Normal in size without focal abnormality.   Adrenals/Urinary Tract: Adrenal glands are unremarkable. Kidneys are normal, without renal calculi, focal lesion, or hydronephrosis. Bladder is unremarkable.   Stomach/Bowel: The stomach appears normal. There is no evidence of bowel obstruction. The appendix appears to be thickened measuring approximately 10 mm. However there is no surrounding inflammation. It is retrocecal in position. Stool is noted throughout the colon.   Vascular/Lymphatic: No significant vascular findings are present. No enlarged abdominal or pelvic lymph nodes.   Reproductive: Prostate is unremarkable.   Other: No abdominal wall hernia or abnormality. No abdominopelvic ascites.   Musculoskeletal: No acute or significant osseous findings.   IMPRESSION: The appendix is thickened and enlarged compared to prior exam, but there is no surrounding inflammation. These findings are equivocal for appendicitis. Correlation with clinical and laboratory data is recommended. These results will be called to the ordering clinician or representative by the Radiologist Assistant, and communication documented in the PACS or zVision Dashboard.   Stable probable left hepatic hemangioma.     Electronically Signed   By: JMarijo ConceptionM.D.   On: 04/29/2021 14:20   Assessment:      Abdominal pain, N/V/D, fever x 2wks  CT and labs as noted above  Plan:    Chronicity of extreme symptoms as reported above along with pain out of proportion to exam,  not consistent with normal wbc, and only slightly thickened retrocecal appendix with NO evidence of inflammation after personally reviewing images myself.  Abdominal exam itself is inconsistent with typical appendicitis presentation due to diffuse nature.  I recommended pain control and observation initially due to inconsistency in presentation, explaining the above findings.  Pt at this point insisting to proceed with appendectomy, understanding if normal appendix is found, will still proceed with appendectomy and no guarantee any symptom relief.  Discussed the risk of surgery including post-op infxn, seroma, hematoma, abscess formation, chronic pain, poor-delayed wound healing, possible bowel resection, possible ostomy, possible conversion to open procedure, post-op SBO or ileus, and need for additional procedures to address said risks.  The risks of general anesthetic including MI, CVA, sudden death or even reaction to anesthetic medications also discussed. Alternatives include continued observation, or antibiotic treatment.  Benefits include possible symptom relief,   Typical post operative recovery of 3-5 days rest, also discussed.  The patient understands  the risks, any and all questions were answered to the patient's satisfaction, still wishes to proceed.  labs/images/medications/previous chart entries reviewed personally and relevant changes/updates noted above.

## 2021-04-29 NOTE — Transfer of Care (Signed)
Immediate Anesthesia Transfer of Care Note ? ?Patient: Michael [name] ? ?Procedure(s) Performed: XI ROBOTIC LAPAROSCOPIC ASSISTED APPENDECTOMY (Abdomen) ? ?Patient Location: PACU ? ?Anesthesia Type:General ? ?Level of Consciousness: drowsy ? ?Airway & Oxygen Therapy: Patient Spontanous Breathing and Patient connected to face mask oxygen ? ?Post-op Assessment: Report given to RN and Post -op Vital signs reviewed and stable ? ?Post vital signs: Reviewed and stable ? ?Last Vitals:  ?Vitals Value Taken Time  ?BP 121/85 04/29/21 2131  ?Temp    ?Pulse 80 04/29/21 2131  ?Resp 11 04/29/21 2131  ?SpO2 95 % 04/29/21 2131  ? ? ?Last Pain:  ?Vitals:  ? 04/29/21 2127  ?TempSrc:   ?PainSc: (P) Asleep  ?   ? ?  ? ?Complications: No notable events documented. ?

## 2021-04-29 NOTE — Anesthesia Preprocedure Evaluation (Signed)
Anesthesia Evaluation  ?Patient identified by MRN, date of birth, ID band ?Patient awake ? ? ? ?Reviewed: ?Allergy & Precautions, H&P , NPO status , Patient's Chart, lab work & pertinent test results, reviewed documented beta blocker date and time  ? ?History of Anesthesia Complications ?(+) PONV, Family history of anesthesia reaction and history of anesthetic complications ? ?Airway ?Mallampati: II ? ?TM Distance: >3 FB ?Neck ROM: full ? ? ? Dental ? ?(+) Teeth Intact, Dental Advidsory Given ?  ?Pulmonary ?neg shortness of breath, asthma , neg sleep apnea, neg COPD, neg recent URI, Current Smoker,  ?  ?Pulmonary exam normal ?breath sounds clear to auscultation ? ? ? ? ? ? Cardiovascular ?Exercise Tolerance: Good ?hypertension, (-) angina(-) Past MI and (-) Cardiac Stents Normal cardiovascular exam(-) dysrhythmias (-) Valvular Problems/Murmurs ?Rhythm:regular Rate:Normal ? ? ?  ?Neuro/Psych ?PSYCHIATRIC DISORDERS Anxiety Depression Bipolar Disorder Schizophrenia negative neurological ROS ?   ? GI/Hepatic ?Neg liver ROS, hiatal hernia, GERD  ,  ?Endo/Other  ?negative endocrine ROS ? Renal/GU ?negative Renal ROS  ?negative genitourinary ?  ?Musculoskeletal ? ? Abdominal ?  ?Peds ? Hematology ?negative hematology ROS ?(+)   ?Anesthesia Other Findings ?Past Medical History: ?No date: Allergy ?04/06/2017: Anal fissure ?No date: Anxiety ?No date: Asthma ?off and on: Chest pain ?No date: COVID-19 ?    Comment:  02/2020 ?No date: Depression ?No date: Family history of adverse reaction to anesthesia ?    Comment:  adopted history unknown ?No date: GERD (gastroesophageal reflux disease) ?last 2015: Pancreatitis ?No date: Substance abuse ([place]) ?    Comment:  per pt heroin/cocaine age [age]  ? ? Reproductive/Obstetrics ?negative OB ROS ? ?  ? ? ? ? ? ? ? ? ? ? ? ? ? ?  ?  ? ? ? ? ? ? ? ? ?Anesthesia Physical ? ?Anesthesia Plan ? ?ASA: 3 and emergent ? ?Anesthesia Plan: General  ? ?Post-op Pain  Management:   ? ?Induction: Intravenous, Rapid sequence and Cricoid pressure planned ? ?PONV Risk Score and Plan: 2 and Ondansetron, Dexamethasone, Midazolam, Aprepitant and Treatment may vary due to age or medical condition ? ?Airway Management Planned: Oral ETT ? ?Additional Equipment:  ? ?Intra-op Plan:  ? ?Post-operative Plan: Extubation in OR ? ?Informed Consent: I have reviewed the patients History and Physical, chart, labs and discussed the procedure including the risks, benefits and alternatives for the proposed anesthesia with the patient or authorized representative who has indicated his/her understanding and acceptance.  ? ? ? ?Dental Advisory Given ? ?Plan Discussed with: Anesthesiologist, CRNA and Surgeon ? ?Anesthesia Plan Comments:   ? ? ? ? ? ? ?Anesthesia Quick Evaluation ? ?

## 2021-04-29 NOTE — ED Triage Notes (Signed)
Pt reports abd pain for over 2 weeks, went to MD today and had a CT scan which showed appendicitis so he was advised to come to the ED.  ?

## 2021-04-29 NOTE — Anesthesia Postprocedure Evaluation (Signed)
Anesthesia Post Note ? ?Patient: Michael [name] ? ?Procedure(s) Performed: XI ROBOTIC LAPAROSCOPIC ASSISTED APPENDECTOMY (Abdomen) ? ?Patient location during evaluation: PACU ?Anesthesia Type: General ?Level of consciousness: awake and alert ?Pain management: pain level controlled ?Vital Signs Assessment: post-procedure vital signs reviewed and stable ?Respiratory status: spontaneous breathing, nonlabored ventilation, respiratory function stable and patient connected to nasal cannula oxygen ?Cardiovascular status: blood pressure returned to baseline and stable ?Postop Assessment: no apparent nausea or vomiting ?Anesthetic complications: no ? ? ?No notable events documented. ? ? ?Last Vitals:  ?Vitals:  ? 04/29/21 2127 04/29/21 2131  ?BP: 115/81 121/85  ?Pulse: 72 80  ?Resp: 16 11  ?Temp: (!) 36.3 ?C   ?SpO2: 94% 95%  ?  ?Last Pain:  ?Vitals:  ? 04/29/21 2131  ?TempSrc:   ?PainSc: 8   ? ? ?  ?  ?  ?  ?  ?  ? ?Martha Clan ? ? ? ? ?

## 2021-04-29 NOTE — Patient Instructions (Signed)
Nice to see you. ?If your abdominal pain worsens or you develop any new or changing symptoms you need to seek medical attention in the emergency department. ?We will contact you with your results. ?

## 2021-04-29 NOTE — Anesthesia Procedure Notes (Signed)
Procedure Name: Intubation ?Date/Time: 04/29/2021 8:05 PM ?Performed by: Esaw Grandchild, CRNA ?Pre-anesthesia Checklist: Patient identified, Emergency Drugs available, Suction available and Patient being monitored ?Patient Re-evaluated:Patient Re-evaluated prior to induction ?Oxygen Delivery Method: Circle system utilized ?Preoxygenation: Pre-oxygenation with 100% oxygen ?Induction Type: IV induction, Rapid sequence and Cricoid Pressure applied ?Ventilation: Mask ventilation without difficulty ?Laryngoscope Size: Mac and 2 ?Grade View: Grade I ?Tube type: Oral ?Tube size: 7.5 mm ?Number of attempts: 1 ?Airway Equipment and Method: Stylet and Oral airway ?Placement Confirmation: ETT inserted through vocal cords under direct vision, positive ETCO2 and breath sounds checked- equal and bilateral ?Secured at: 23 cm ?Tube secured with: Tape ?Dental Injury: Teeth and Oropharynx as per pre-operative assessment  ? ? ? ? ?

## 2021-04-29 NOTE — ED Provider Notes (Signed)
? ? ?[facility] ?Emergency Department Provider Note ? ? ? ? Event Date/Time  ? First MD Initiated Contact with Patient 04/29/21 1515   ?  (approximate) ? ? ?History  ? ?Abdominal Pain ? ? ?HPI ? ?Michael [name] is a 31 y.o. male presents to the ED after an outpatient CT scan for RLQ abdominal pain for the last 2 weeks. He was called by Dr. Biagio Quint with results, and advised to report to the ED. Patient endorses nausea, vomiting, diarrhea, bloating, and abdominal distension. He is a recovering drug addict, and wishes to avoid opioids, if possible.  ? ? ?Physical Exam  ? ?Triage Vital Signs: ?ED Triage Vitals [04/29/21 1512]  ?Enc Vitals Group  ?   BP   ?   Pulse   ?   Resp   ?   Temp   ?   Temp src   ?   SpO2   ?   Weight 210 lb (95.3 kg)  ?   Height '5\' 8"'$  (1.727 m)  ?   Head Circumference   ?   Peak Flow   ?   Pain Score 6  ?   Pain Loc   ?   Pain Edu?   ?   Excl. in [place]?   ? ? ?Most recent vital signs: ?Vitals:  ? 04/29/21 1518  ?BP: (!) 167/112  ?Pulse: 99  ?Resp: 18  ?Temp: 98.3 ?F (36.8 ?C)  ?SpO2: 98%  ? ? ?General Awake, no distress. uncomfortable ?CV:  Good peripheral perfusion. RRR ?RESP:  Normal effort. CTA ?ABD:  Soft, distention. Normoactive bowel. Tender diffusely and specifically over the RLQ ? ? ?ED Results / Procedures / Treatments  ? ?Labs ?(all labs ordered are listed, but only abnormal results are displayed) ?Labs Reviewed  ?CBC WITH DIFFERENTIAL/PLATELET - Abnormal; Notable for the following components:  ?    Result Value  ? Eosinophils Absolute 0.8 (*)   ? All other components within normal limits  ?RESP PANEL BY RT-PCR (FLU A&B, COVID) ARPGX2  ?BASIC METABOLIC PANEL  ?RAPID URINE DRUG SCREEN, HOSP PERFORMED  ? ? ? ?EKG ? ? ?RADIOLOGY ? ?I personally viewed and evaluated these images as part of my medical decision making, as well as reviewing the written report by the radiologist. ? ?ED Provider Interpretation: agree with report} ? ?CT Abdomen Pelvis W Contrast ? ?Result  Date: 04/29/2021 ?CLINICAL DATA:  Acute generalized abdominal pain. EXAM: CT ABDOMEN AND PELVIS WITH CONTRAST TECHNIQUE: Multidetector CT imaging of the abdomen and pelvis was performed using the standard protocol following bolus administration of intravenous contrast. RADIATION DOSE REDUCTION: This exam was performed according to the departmental dose-optimization program which includes automated exposure control, adjustment of the mA and/or kV according to patient size and/or use of iterative reconstruction technique. CONTRAST:  115m OMNIPAQUE IOHEXOL 300 MG/ML  SOLN COMPARISON:  January 22, 2021. FINDINGS: Lower chest: No acute abnormality. Hepatobiliary: Stable probable left hepatic hemangioma. Status post cholecystectomy. No biliary dilatation. Pancreas: Unremarkable. No pancreatic ductal dilatation or surrounding inflammatory changes. Spleen: Normal in size without focal abnormality. Adrenals/Urinary Tract: Adrenal glands are unremarkable. Kidneys are normal, without renal calculi, focal lesion, or hydronephrosis. Bladder is unremarkable. Stomach/Bowel: The stomach appears normal. There is no evidence of bowel obstruction. The appendix appears to be thickened measuring approximately 10 mm. However there is no surrounding inflammation. It is retrocecal in position. Stool is noted throughout the colon. Vascular/Lymphatic: No significant vascular findings are present. No enlarged abdominal or  pelvic lymph nodes. Reproductive: Prostate is unremarkable. Other: No abdominal wall hernia or abnormality. No abdominopelvic ascites. Musculoskeletal: No acute or significant osseous findings. IMPRESSION: The appendix is thickened and enlarged compared to prior exam, but there is no surrounding inflammation. These findings are equivocal for appendicitis. Correlation with clinical and laboratory data is recommended. These results will be called to the ordering clinician or representative by the Radiologist Assistant, and  communication documented in the PACS or zVision Dashboard. Stable probable left hepatic hemangioma. Electronically Signed   By: Marijo Conception M.D.   On: 04/29/2021 14:20   ? ? ?PROCEDURES: ? ?Critical Care performed: No ? ?Procedures ? ? ?MEDICATIONS ORDERED IN ED: ?Medications  ?ceFAZolin (ANCEF) IVPB 2g/100 mL premix (has no administration in time range)  ?famotidine (PEPCID) IVPB 20 mg premix (0 mg Intravenous Stopped 04/29/21 1629)  ?clonazePAM (KLONOPIN) disintegrating tablet 0.5 mg (0.5 mg Oral Given 04/29/21 1559)  ? ? ? ?IMPRESSION / MDM / ASSESSMENT AND PLAN / ED COURSE  ?I reviewed the triage vital signs and the nursing notes. ?             ?               ? ?Differential diagnosis includes, but is not limited to, acute appendicitis, renal colic, testicular torsion, urinary tract infection/pyelonephritis, prostatitis,  epididymitis, diverticulitis, small bowel obstruction or ileus, colitis, abdominal aortic aneurysm, gastroenteritis, hernia, etc. ? ?The patient is on the cardiac monitor to evaluate for evidence of arrhythmia and/or significant heart rate changes. ? ?Patient to the ED at the advice of his PCP, following outpatient CT scan.  Patient had endorsed 2 weeks of intermittent nausea, vomiting, abdominal pain, and fevers.  He was found today to have a CT that confirmed an enlarged appendix without surrounding peritonitis.  Patient was advised to report to the ED for further evaluation.  Basic labs were performed which were reassuring and showed no acute leukocytosis or critical anemia.  Lecture lites are within normal limits.  I consulted Dr. Lysle Pearl and general surgery, who went to evaluate the patient in the ED prior to any further work-up.  Lysle Pearl saw the patient in the ED and reviewed the CT images and felt that the patient patient's was less likely an acute appendicitis.  He along with the patient discussed the options, and the patient opted to proceed for an elective appendectomy at this time.   Diagnosis is consistent with RLQ pain concerning for subacute appendicitis. Patient will be admitted to the general surgery for an pending laparoscopic/robot-assisted procedure.  Consents and preop antibiotics ordered by Dr. Vonna Drafts will be provided in the ED.  Patient is stable at this time and prepared for his surgery.   ? ?FINAL CLINICAL IMPRESSION(S) / ED DIAGNOSES  ? ?Final diagnoses:  ?Generalized abdominal pain  ?Appendicitis, unspecified appendicitis type  ? ? ? ?Rx / DC Orders  ? ?ED Discharge Orders   ? ? None  ? ?  ? ? ? ?Note:  This document was prepared using Dragon voice recognition software and may include unintentional dictation errors. ? ?  ?Melvenia Needles, PA-C ?04/29/21 1708 ? ?  ?Lucrezia Starch, MD ?04/29/21 2144 ? ?

## 2021-04-29 NOTE — Op Note (Addendum)
Preoperative diagnosis: abdominal pain ? ?Postoperative diagnosis: acute appendicitis ? ?Procedure: Robotic assisted laparoscopic appendectomy. ? ?Anesthesia: GETA ? ?Surgeon: Benjamine Sprague ? ?Wound Classification: clean contaminated ? ?Specimen: Appendix ? ?Complications: None ? ?Estimated Blood Loss: 3 mL ? ? ?Indications: Patient is a 31 y.o. male  presented with above.  Please see H&P for further details.   ? ?FIndings: ?1.  Irritated appendix  ?2. No peri-appendiceal abscess or phlegmon ?3. Normal anatomy ?4. Appendiceal artery ligated and divided with stapler ?5. Adequate hemostasis.  ? ?Description of procedure: The patient was placed on the operating table in the supine position, left arm tucked. General anesthesia was induced. A time-out was completed verifying correct patient, procedure, site, positioning, and implant(s) and/or special equipment prior to beginning this procedure. The abdomen was prepped and draped in the usual sterile fashion.  ? ?Palmer's point located and Veress needle was inserted.  After confirming 2 clicks and a positive saline drop test, gas insufflation was initiated until the abdominal pressure was measured at 15 mmHg.  Afterwards, the Veress needle was removed and a 8 mm port was placed through a periumbilical site using Optiview technique after incision with an 11 blade.  After local was infused, 2 additional incision was made 8 cm apart each side along the left side of the abdominal wall from the initial incision.  An 8 mm port was caudaed and 5m port cephalad from initial incision, both under direct visualization.  No injuries from trocar placements were noted. The table was placed in the Trendelenburg position with the right side elevated.  Xi robotic platform was then brought to the operative field and docked. ? ?Minimally thickened and  inflamed appendix was identified and elevated.  Infection was present within the abdominal cavity due to appendicitis. Window created at  base of appendix in the mesentery.   ?A blue load linear cutting stapler was then used to divide and staple the base of the appendix. It was reloaded with a vascular cartridge and the mesoappendix similarly divided.  No bleeding from the staple lines noted after cautery at staple line.  Small cautery injury noted on colon afterwards.  Area reinforced with 3-0 silk and securing adjacent fat pad over cautery mark. The appendix was placed in an endoscopic retrieval bag and removed.  ? ?The appendiceal stump and mesoappendix staple line examined again and hemostasis noted. No other pathology was identified within pelvis. The 12 mm trocar removed and port site closed with PMI using 0 vicryl under direct vision. Remaining trocars were removed under direct vision. No bleeding was noted.The abdomen was allowed to collapse. 3-0 vicryl interrupted used to close 130mport site at dermal level, and then all skin incisions then closed with subcuticular sutures Monocryl 4-0.  Wounds then dressed with dermabond. ? ?The patient tolerated the procedure well, awakened from anesthesia and was taken to the postanesthesia care unit in satisfactory condition.  Sponge count and instrument count correct at the end of the procedure. ? ?

## 2021-04-29 NOTE — Progress Notes (Signed)
?Tommi Rumps, MD ?Phone: [phone] ? ?Michael [name] is a 31 y.o. male who presents today for same-day visit. ? ?Abdominal pain/chest discomfort: Patient notes continued issues since her last visit.  Since then he has developed abdominal discomfort on the right side.  He notes his sternum aches and is tender to palpation and he has had shortness of breath with exertion.  He notes no change to his chronic diarrhea.  He notes occasional vomiting that is nonbloody and nonbilious.  No blood in stool.  No fevers.  He feels as though his abdomen is distended.  He notes no change to his chronic cough.  He previously had lab work that revealed a mildly elevated WBC and a mildly elevated ALT.  He had a negative troponin and a negative D-dimer.  The patient notes he wanted to avoid the ED earlier in the week given his anxiety and prior experiences in the ED and hospital.  He additionally has had some sore throat.  He has had multiple negative COVID tests. ? ?Social History  ? ?Tobacco Use  ?Smoking Status Every Day  ? Packs/day: 1.50  ? Years: 15.00  ? Pack years: 22.50  ? Types: Cigarettes  ?Smokeless Tobacco Former  ? Types: Chew  ? Quit date: 08/12/2017  ?Tobacco Comments  ? 1 pack per day--09/08/2019  ? ? ?Current Outpatient Medications on File Prior to Visit  ?Medication Sig Dispense Refill  ? albuterol (VENTOLIN HFA) 108 (90 Base) MCG/ACT inhaler Inhale 2 puffs into the lungs every 6 (six) hours as needed for wheezing or shortness of breath. 18 g 11  ? ARIPiprazole (ABILIFY) 10 MG tablet Take 10 mg by mouth every morning.    ? clonazePAM (KLONOPIN) 0.5 MG disintegrating tablet Take 0.5 mg by mouth 3 (three) times daily as needed.    ? cloNIDine (CATAPRES) 0.1 MG tablet SMARTSIG:1 Tablet(s) By Mouth Every Evening    ? diazepam (VALIUM) 2 MG tablet Take 8 mg by mouth daily.    ? doxycycline (VIBRA-TABS) 100 MG tablet Take 1 tablet (100 mg total) by mouth 2 (two) times daily. 14 tablet 0  ? eszopiclone (LUNESTA) 1  MG TABS tablet Take 1 mg by mouth at bedtime.    ? famotidine-calcium carbonate-magnesium hydroxide (PEPCID COMPLETE) 10-800-165 MG chewable tablet Chew 1 tablet by mouth daily as needed.    ? Fluticasone-Umeclidin-Vilant (TRELEGY ELLIPTA) 200-62.5-25 MCG/INH AEPB Inhale 1 puff into the lungs daily. Rinse mouth 60 each 11  ? hydrOXYzine (ATARAX/VISTARIL) 50 MG tablet Take 50 mg by mouth at bedtime.    ? levofloxacin (LEVAQUIN) 500 MG tablet Take 1 tablet (500 mg total) by mouth daily. 7 tablet 0  ? loperamide (IMODIUM A-D) 2 MG tablet Take 2 tablets (4 mg total) by mouth 4 (four) times daily as needed for diarrhea or loose stools. 30 tablet 0  ? omeprazole (PRILOSEC) 40 MG capsule TAKE 1 CAPSULE BY MOUTH ONCE DAILY 30 capsule 2  ? prazosin (MINIPRESS) 5 MG capsule Take 5 mg by mouth at bedtime.    ? propranolol ER (INDERAL LA) 60 MG 24 hr capsule SMARTSIG:1 Capsule(s) By Mouth Every Evening    ? Skin Protectants, Misc. (CERAVE) OINT Apply 1 application topically daily as needed (itching).    ? traMADol (ULTRAM) 50 MG tablet Take 1 tablet (50 mg total) by mouth every 6 (six) hours as needed for moderate pain. 12 tablet 0  ? triamcinolone cream (KENALOG) 0.1 % Apply 1 application topically 2 (two) times daily as  needed. 80 g 2  ? dicyclomine (BENTYL) 20 MG tablet Take 1 tablet (20 mg total) by mouth every 6 (six) hours for 5 days. 20 tablet 0  ? ?No current facility-administered medications on file prior to visit.  ? ? ? ?ROS see history of present illness ? ?Objective ? ?Physical Exam ?Vitals:  ? 04/29/21 1027  ?BP: 140/70  ?Pulse: 93  ?Temp: 97.8 ?F (36.6 ?C)  ?SpO2: 98%  ? ? ?BP Readings from Last 3 Encounters:  ?04/29/21 140/70  ?01/26/21 135/81  ?01/22/21 (!) 144/95  ? ?Wt Readings from Last 3 Encounters:  ?04/29/21 209 lb 12.8 oz (95.2 kg)  ?04/20/21 202 lb (91.6 kg)  ?01/26/21 187 lb (84.8 kg)  ? ? ?Physical Exam ?Constitutional:   ?   General: He is not in acute distress. ?   Appearance: He is not diaphoretic.   ?Cardiovascular:  ?   Rate and Rhythm: Normal rate and regular rhythm.  ?   Heart sounds: Normal heart sounds.  ?Pulmonary:  ?   Effort: Pulmonary effort is normal.  ?   Breath sounds: Normal breath sounds.  ?Abdominal:  ?   General: Bowel sounds are normal. There is distension.  ?   Palpations: Abdomen is soft.  ?   Tenderness: There is abdominal tenderness (Tender throughout though more focused in the right mid and right upper quadrant). There is guarding. There is no rebound.  ?Skin: ?   General: Skin is warm and dry.  ?Neurological:  ?   Mental Status: He is alert.  ? ? ? ?Assessment/Plan: Please see individual problem list. ? ?Problem List Items Addressed This Visit   ? ? Abdominal pain  ?  The patient has developed abdominal pain with abdominal distention and tenderness throughout his abdomen though more so on the right side.  He has an acute abdomen.  He does have guarding.  I am concerned for an intra-abdominal process that is contributing to his symptoms.  We will obtain a CT scan today.  I discussed that with his symptoms it is very likely that the CT scan will reveal something that will require a hospital visit.  Discussed if he had any worsening symptoms prior to getting the CT scan done he would need to go to the emergency department.  There is significant potential for an adverse outcome with his current abdominal exam. ?  ?  ? Relevant Orders  ? CT Abdomen Pelvis W Contrast  ? Comp Met (CMET)  ? CBC w/Diff  ? Sore throat - Primary  ?  Strep test was negative.  This could potentially be related to what ever is causing his abdominal discomfort. ?  ?  ? Relevant Orders  ? POCT rapid strep A (Completed)  ? ? ?Return in about 2 weeks (around 05/13/2021) for Abdominal pain. ? ?This visit occurred during the SARS-CoV-2 public health emergency.  Safety protocols were in place, including screening questions prior to the visit, additional usage of staff PPE, and extensive cleaning of exam room while observing  appropriate contact time as indicated for disinfecting solutions.  ? ? ?Tommi Rumps, MD ?[place] ? ?

## 2021-04-29 NOTE — Telephone Encounter (Signed)
See result note. The patient has already been advised to go to the ED.  ?

## 2021-04-30 ENCOUNTER — Encounter: Payer: Self-pay | Admitting: Surgery

## 2021-04-30 DIAGNOSIS — K36 Other appendicitis: Secondary | ICD-10-CM | POA: Diagnosis not present

## 2021-04-30 DIAGNOSIS — K3580 Unspecified acute appendicitis: Secondary | ICD-10-CM | POA: Diagnosis not present

## 2021-04-30 LAB — BASIC METABOLIC PANEL
Anion gap: 7 (ref 5–15)
BUN: 14 mg/dL (ref 6–20)
CO2: 23 mmol/L (ref 22–32)
Calcium: 8.7 mg/dL — ABNORMAL LOW (ref 8.9–10.3)
Chloride: 101 mmol/L (ref 98–111)
Creatinine, Ser: 1.01 mg/dL (ref 0.61–1.24)
GFR, Estimated: >60 mL/min (ref >60)
Glucose, Bld: 141 mg/dL — ABNORMAL HIGH (ref 70–99)
Potassium: 4.2 mmol/L (ref 3.5–5.1)
Sodium: 131 mmol/L — ABNORMAL LOW (ref 135–145)

## 2021-04-30 LAB — CBC
HCT: 42.6 % (ref 39.0–52.0)
Hemoglobin: 14.6 g/dL (ref 13.0–17.0)
MCH: 31.7 pg (ref 26.0–34.0)
MCHC: 34.3 g/dL (ref 30.0–36.0)
MCV: 92.4 fL (ref 80.0–100.0)
Platelets: 195 10*3/uL (ref 150–400)
RBC: 4.61 MIL/uL (ref 4.22–5.81)
RDW: 12.6 % (ref 11.5–15.5)
WBC: 10.9 10*3/uL — ABNORMAL HIGH (ref 4.0–10.5)
nRBC: 0 % (ref 0.0–0.2)

## 2021-04-30 LAB — HEMOGLOBIN AND HEMATOCRIT, BLOOD
HCT: 41.9 % (ref 39.0–52.0)
Hemoglobin: 14.3 g/dL (ref 13.0–17.0)

## 2021-04-30 MED ORDER — ACETAMINOPHEN 325 MG PO TABS: 650.0000 mg | ORAL_TABLET | Freq: Three times a day (TID) | ORAL | 0 refills | Status: AC | PRN

## 2021-04-30 MED ORDER — SODIUM CHLORIDE 0.9 % IV BOLUS
250.0000 mL | Freq: Once | INTRAVENOUS | Status: AC
  Administered 2021-04-30: 250 mL via INTRAVENOUS

## 2021-04-30 MED ORDER — CELECOXIB 200 MG PO CAPS: 200.0000 mg | ORAL_CAPSULE | Freq: Every day | ORAL | 0 refills | Status: AC

## 2021-04-30 MED ORDER — ALUM & MAG HYDROXIDE-SIMETH 200-200-20 MG/5ML PO SUSP
30.0000 mL | ORAL | Status: DC | PRN
  Administered 2021-04-30: 30 mL via ORAL
  Filled 2021-04-30: qty 30

## 2021-04-30 NOTE — Plan of Care (Signed)

## 2021-04-30 NOTE — TOC Transition Note (Signed)
Transition of Care (TOC) - CM/SW Discharge Note ? ? ?Patient Details  ?Name: Michael [name] ?MRN: [id number] ?Date of Birth: 1990-08-26 ? ?Transition of Care (TOC) CM/SW Contact:  ?Izola Price, RN ?Phone Number: ?04/30/2021, 1:12 PM ? ? ?Clinical Narrative: 3/11: Patient discharging home. Code-44 status completed. No TOC identified or ordered. Patient indicated he had a ride home to call. [name] RN CM   ? ? ? ?  ?  ? ? ?Patient Goals and CMS Choice ?  ?  ?  ? ?Discharge Placement ?  ?           ?  ?  ?  ?  ? ?Discharge Plan and Services ?  ?  ?           ?  ?  ?  ?  ?  ?  ?  ?  ?  ?  ? ?Social Determinants of Health (SDOH) Interventions ?  ? ? ?Readmission Risk Interventions ?No flowsheet data found. ? ? ? ? ?

## 2021-04-30 NOTE — Progress Notes (Signed)
Patient discharged to home via family/support system vehicle. Patient given discharge instructions and aftercare instructions for post-op lap sites. [facility]. No complaints or comments about medication instructions or discharge instructions. PIV x 1  removed with no complications. No questions or concerns for patient or family. ?

## 2021-04-30 NOTE — Discharge Instructions (Signed)
Laparoscopic Appendectomy, Care After This sheet gives you information about how to care for yourself after your procedure. Your doctor may also give you more specific instructions. If you have problems or questions, contact your doctor. Follow these instructions at home: Care for cuts from surgery (incisions)  Follow instructions from your doctor about how to take care of your cuts from surgery. Make sure you: Wash your hands with soap and water before you change your bandage (dressing). If you cannot use soap and water, use hand sanitizer. Change your bandage as told by your doctor. Leave stitches (sutures), skin glue, or skin tape (adhesive) strips in place. They may need to stay in place for 2 weeks or longer. If tape strips get loose and curl up, you may trim the loose edges. Do not remove tape strips completely unless your doctor says it is okay. Do not take baths, swim, or use a hot tub until your doctor says it is okay. OK TO SHOWER 24HRS AFTER YOUR SURGERY.  Check your surgical cut area every day for signs of infection. Check for: More redness, swelling, or pain. More fluid or blood. Warmth. Pus or a bad smell. Activity Do not drive or use heavy machinery while taking prescription pain medicine. Do not play contact sports until your doctor says it is okay. Do not drive for 24 hours if you were given a medicine to help you relax (sedative). Rest as needed. Do not return to work or school until your doctor says it is okay. General instructions  tylenol and advil as needed for discomfort.  Please alternate between the two every four hours as needed for pain.    Use narcotics, if prescribed, only when tylenol and motrin is not enough to control pain.  325-650mg every 8hrs to max of 3000mg/24hrs (including the 325mg in every norco dose) for the tylenol.    Advil up to 800mg per dose every 8hrs as needed for pain.   To prevent or treat constipation while you are taking prescription pain  medicine, your doctor may recommend that you: Drink enough fluid to keep your pee (urine) clear or pale yellow. Take over-the-counter or prescription medicines. Eat foods that are high in fiber, such as fresh fruits and vegetables, whole grains, and beans. Limit foods that are high in fat and processed sugars, such as fried and sweet foods. Contact a doctor if: You develop a rash. You have more redness, swelling, or pain around your surgical cuts. You have more fluid or blood coming from your surgical cuts. Your surgical cuts feel warm to the touch. You have pus or a bad smell coming from your surgical cuts. You have a fever. One or more of your surgical cuts breaks open. Get help right away if: You have trouble breathing. You have chest pain. You faint or feel dizzy when you stand. You have leg pain. This information is not intended to replace advice given to you by your health care provider. Make sure you discuss any questions you have with your health care provider. Document Released: 11/16/2007 Document Revised: 08/28/2015 Document Reviewed: 07/26/2015 Elsevier Interactive Patient Education  2019 [employer]  

## 2021-04-30 NOTE — Care Management CC44 (Signed)
Condition Code 44 Documentation Completed ? ?Patient Details  ?Name: Michael [name] ?MRN: [id number] ?Date of Birth: 11-07-1990 ? ? ?Condition Code 44 given:  Yes ?Patient signature on Condition Code 44 notice:  Yes ?Documentation of 2 MD's agreement:  Yes ?Code 44 added to claim:  Yes ? ? ? ?Izola Price, RN ?04/30/2021, 1:10 PM ? ?

## 2021-04-30 NOTE — Plan of Care (Signed)
?  Problem: Education: ?Goal: Knowledge of General Education information will improve ?Description: Including pain rating scale, medication(s)/side effects and non-pharmacologic comfort measures ?04/30/2021 1511 by Evelena Peat, RN ?Outcome: Completed/Met ?04/30/2021 1025 by Evelena Peat, RN ?Outcome: Progressing ?  ?Problem: Health Behavior/Discharge Planning: ?Goal: Ability to manage health-related needs will improve ?04/30/2021 1511 by Evelena Peat, RN ?Outcome: Completed/Met ?04/30/2021 1025 by Evelena Peat, RN ?Outcome: Progressing ?  ?Problem: Clinical Measurements: ?Goal: Ability to maintain clinical measurements within normal limits will improve ?04/30/2021 1511 by Evelena Peat, RN ?Outcome: Completed/Met ?04/30/2021 1025 by Evelena Peat, RN ?Outcome: Progressing ?Goal: Will remain free from infection ?04/30/2021 1511 by Evelena Peat, RN ?Outcome: Completed/Met ?04/30/2021 1025 by Evelena Peat, RN ?Outcome: Progressing ?Goal: Diagnostic test results will improve ?04/30/2021 1511 by Evelena Peat, RN ?Outcome: Completed/Met ?04/30/2021 1025 by Evelena Peat, RN ?Outcome: Progressing ?Goal: Respiratory complications will improve ?04/30/2021 1511 by Evelena Peat, RN ?Outcome: Completed/Met ?04/30/2021 1025 by Evelena Peat, RN ?Outcome: Progressing ?Goal: Cardiovascular complication will be avoided ?04/30/2021 1511 by Evelena Peat, RN ?Outcome: Completed/Met ?04/30/2021 1025 by Evelena Peat, RN ?Outcome: Progressing ?  ?Problem: Activity: ?Goal: Risk for activity intolerance will decrease ?04/30/2021 1511 by Evelena Peat, RN ?Outcome: Completed/Met ?04/30/2021 1025 by Evelena Peat, RN ?Outcome: Progressing ?  ?Problem: Nutrition: ?Goal: Adequate nutrition will be maintained ?04/30/2021 1511 by Evelena Peat, RN ?Outcome: Completed/Met ?04/30/2021 1025 by Evelena Peat, RN ?Outcome: Progressing ?  ?Problem: Coping: ?Goal: Level  of anxiety will decrease ?04/30/2021 1511 by Evelena Peat, RN ?Outcome: Completed/Met ?04/30/2021 1025 by Evelena Peat, RN ?Outcome: Progressing ?  ?Problem: Elimination: ?Goal: Will not experience complications related to bowel motility ?04/30/2021 1511 by Evelena Peat, RN ?Outcome: Completed/Met ?04/30/2021 1025 by Evelena Peat, RN ?Outcome: Progressing ?Goal: Will not experience complications related to urinary retention ?04/30/2021 1511 by Evelena Peat, RN ?Outcome: Completed/Met ?04/30/2021 1025 by Evelena Peat, RN ?Outcome: Progressing ?  ?Problem: Pain Managment: ?Goal: General experience of comfort will improve ?04/30/2021 1511 by Evelena Peat, RN ?Outcome: Completed/Met ?04/30/2021 1025 by Evelena Peat, RN ?Outcome: Progressing ?  ?Problem: Safety: ?Goal: Ability to remain free from injury will improve ?04/30/2021 1511 by Evelena Peat, RN ?Outcome: Completed/Met ?04/30/2021 1025 by Evelena Peat, RN ?Outcome: Progressing ?  ?Problem: Skin Integrity: ?Goal: Risk for impaired skin integrity will decrease ?04/30/2021 1511 by Evelena Peat, RN ?Outcome: Completed/Met ?04/30/2021 1025 by Evelena Peat, RN ?Outcome: Progressing ?  ?

## 2021-04-30 NOTE — Progress Notes (Addendum)
?   04/30/21 0110  ?Assess: MEWS Score  ?Temp 98.2 ?F (36.8 ?C)  ?BP 100/75  ?Pulse Rate (!) 50  ?Resp 16  ?Level of Consciousness Responds to Voice  ?SpO2 97 %  ?O2 Device Room Air  ?Assess: MEWS Score  ?MEWS Temp 0  ?MEWS Systolic 1  ?MEWS Pulse 1  ?MEWS RR 0  ?MEWS LOC 1  ?MEWS Score 3  ?MEWS Score Color Yellow  ?Assess: if the MEWS score is Yellow or Red  ?Were vital signs taken at a resting state? Yes  ?Focused Assessment Change from prior assessment (see assessment flowsheet)  ?Does the patient meet 2 or more of the SIRS criteria? No  ?MEWS guidelines implemented *See Row Information* Yes  ?Treat  ?MEWS Interventions Other (Comment)  ?Pain Scale 0-10  ?Pain Score 0  ?Escalate  ?MEWS: Escalate Yellow: discuss with charge nurse/RN and consider discussing with provider and RRT  ?Notify: Charge Nurse/RN  ?Name of Charge Nurse/RN Notified TConyers  ?Date Charge Nurse/RN Notified 04/30/21  ?Time Charge Nurse/RN Notified 0142  ?Notify: Provider  ?Provider Name/Title BMorrison  ?Date Provider Notified 04/30/21  ?Time Provider Notified [phone]  ?Notification Type Call  ?Notification Reason Change in status  ?Provider response At bedside  ?Date of Provider Response 04/30/21  ?Time of Provider Response 0115  ?Assess: SIRS CRITERIA  ?SIRS Temperature  0  ?SIRS Pulse 0  ?SIRS Respirations  0  ?SIRS WBC 0  ?SIRS Score Sum  0  ? ?0700 Dr Lysle Pearl aware of pt's improved status ?

## 2021-05-01 NOTE — Discharge Summary (Signed)
Physician Discharge Summary  ?Patient ID: ?Michael [name] ?MRN: [id number] ?DOB/AGE: 31/15/1992 31 y.o. ? ?Admit date: 04/29/2021 ?Discharge date: 04/30/21 ? ?Admission Diagnoses: acute appendicitis ? ?Discharge Diagnoses:  ?Same as above ? ?Discharged Condition: good ? ?Hospital Course: admitted for above. Underwent surgery.  Please see op note for details.  Post op, recovered as expected.  At time of d/c, tolerating diet and pain controlled ? ?Consults: None ? ?Discharge Exam: ?Blood pressure 111/62, pulse 87, temperature 97.6 ?F (36.4 ?C), resp. rate 18, height '5\' 8"'$  (1.727 m), weight 95.3 kg, SpO2 93 %. ?General appearance: alert, cooperative, and no distress ?GI: soft, no guarding RLQ and incisional pain as expected ? ?Disposition:  ?Discharge disposition: 01-Home or Self Care ? ? ? ? ? ? ? ?Allergies as of 04/30/2021   ? ?   Reactions  ? Bee Venom Anaphylaxis  ? Quetiapine Rash  ? Serotonin Reuptake Inhibitors (ssris) Rash  ? Amoxicillin Diarrhea  ? Has patient had a PCN reaction causing immediate rash, facial/tongue/throat swelling, SOB or lightheadedness with hypotension: No ?Has patient had a PCN reaction causing severe rash involving mucus membranes or skin necrosis: No ?Has patient had a PCN reaction that required hospitalization: No ?Has patient had a PCN reaction occurring within the last 10 years: Yes ?If all of the above answers are "NO", then may proceed with Cephalosporin use.  ? Gabapentin Nausea And Vomiting  ? diarrhea  ? Prednisone Other (See Comments)  ? Difficulty breathing  ? Latuda [lurasidone Hcl] Rash, Nausea And Vomiting  ? ?  ? ?  ?Medication List  ?  ? ?STOP taking these medications   ? ?Trelegy Ellipta 200-62.5-25 MCG/ACT Aepb ?Generic drug: Fluticasone-Umeclidin-Vilant ?  ? ?  ? ?TAKE these medications   ? ?acetaminophen 325 MG tablet ?Commonly known as: Tylenol ?Take 2 tablets (650 mg total) by mouth every 8 (eight) hours as needed for mild pain. ?  ?ARIPiprazole 10 MG tablet ?Commonly  known as: ABILIFY ?Take 10 mg by mouth every morning. ?  ?celecoxib 200 MG capsule ?Commonly known as: CeleBREX ?Take 1 capsule (200 mg total) by mouth daily for 5 days. ?  ?cloNIDine 0.1 MG tablet ?Commonly known as: CATAPRES ?Take 0.1 mg by mouth every evening. ?  ?diazepam 5 MG tablet ?Commonly known as: VALIUM ?Take 5 mg by mouth in the morning. (Take with '2mg'$  tablet to equal '7mg'$  in morning) ?  ?diazepam 2 MG tablet ?Commonly known as: VALIUM ?Take 2 mg by mouth in the morning. (Take with '5mg'$  tablet to equal '7mg'$  in morning) ?  ?diazepam 10 MG tablet ?Commonly known as: VALIUM ?Take 10 mg by mouth at bedtime. ?  ?DULoxetine 60 MG capsule ?Commonly known as: CYMBALTA ?Take 60 mg by mouth 2 (two) times daily. (Take with '20mg'$  capsules to equal '80mg'$  total) ?  ?DULoxetine 20 MG capsule ?Commonly known as: CYMBALTA ?Take 20 mg by mouth 2 (two) times daily. (Take with '60mg'$  capsules to equal '80mg'$  total) ?  ?famotidine-calcium carbonate-magnesium hydroxide 10-800-165 MG chewable tablet ?Commonly known as: PEPCID COMPLETE ?Chew 1 tablet by mouth daily as needed (heartburn or indigestion). ?  ?omeprazole 40 MG capsule ?Commonly known as: PRILOSEC ?TAKE 1 CAPSULE BY MOUTH ONCE DAILY ?  ?prazosin 5 MG capsule ?Commonly known as: MINIPRESS ?Take 5 mg by mouth at bedtime. (Take with '2mg'$  capsule to equal '7mg'$  total) ?  ?prazosin 2 MG capsule ?Commonly known as: MINIPRESS ?Take 2 mg by mouth at bedtime. (Take with '5mg'$  capsule to equal  $'7mg'o$  total) ?  ? ?  ? ? Follow-up Information   ? ? Jakirah Zaun, DO Follow up in 2 week(s).   ?Specialty: Surgery ?Why: post op appy ?Contact information: ?[address] ?[place] [address] ?[phone] ? ? ?  ?  ? ?  ?  ? ?  ? ? ? ?Total time spent arranging discharge was >7mn. ?Signed: ?IBenjamine Sprague?05/01/2021, 7:13 PM ? ?

## 2021-05-02 LAB — GLUCOSE, CAPILLARY: Glucose-Capillary: 143 mg/dL — ABNORMAL HIGH (ref 70–99)

## 2021-05-03 LAB — SURGICAL PATHOLOGY

## 2021-05-09 ENCOUNTER — Telehealth: Payer: Self-pay

## 2021-05-09 ENCOUNTER — Encounter: Payer: Self-pay | Admitting: Family Medicine

## 2021-05-09 ENCOUNTER — Other Ambulatory Visit: Payer: Self-pay

## 2021-05-09 ENCOUNTER — Ambulatory Visit (INDEPENDENT_AMBULATORY_CARE_PROVIDER_SITE_OTHER): Payer: Medicare Other | Admitting: Family Medicine

## 2021-05-09 VITALS — BP 130/80 | HR 88 | Temp 98.7°F | Ht 67.0 in | Wt 205.2 lb

## 2021-05-09 DIAGNOSIS — Z9049 Acquired absence of other specified parts of digestive tract: Secondary | ICD-10-CM | POA: Insufficient documentation

## 2021-05-09 DIAGNOSIS — D721 Eosinophilia, unspecified: Secondary | ICD-10-CM | POA: Insufficient documentation

## 2021-05-09 HISTORY — DX: Acquired absence of other specified parts of digestive tract: Z90.49

## 2021-05-09 NOTE — Patient Instructions (Signed)
Nice to see you. ?You should hear from the GI physician, hematologist, and general surgeon.  If you do not hear from them in the next couple of weeks please let us know. ?If you develop worsening abdominal pain or signs of infection surrounding your scars you need to seek medical attention. ?

## 2021-05-09 NOTE — Assessment & Plan Note (Signed)
Patient is doing significantly better after appendectomy.  Discussed that he should continue to improve.  Advised if he develops any signs of infection at the site of his incisions or if he develops any abdominal pain he needs to be evaluated immediately.  I will refer him to the general surgeon for follow-up as well.  Given the eosinophilic infiltrate noted on pathology I will refer to GI for further evaluation. ?

## 2021-05-09 NOTE — Telephone Encounter (Signed)
SCHEDULED FOR 08/30/2021 ?

## 2021-05-09 NOTE — Assessment & Plan Note (Signed)
On review of prior eosinophil counts it appears that he has fluctuated between mildly elevated and normal levels.  Given the recent pathology findings on his appendectomy and the intermittent elevations in eosinophil counts I will refer to hematology to get their input. ?

## 2021-05-09 NOTE — Progress Notes (Signed)
?Tommi Rumps, MD ?Phone: [phone] ? ?Michael [name] is a 31 y.o. male who presents today for follow-up. ? ?Appendicitis: Patient was evaluated in the hospital and underwent appendectomy.  Pathology report revealed appendicitis with edema and eosinophilic infiltrate of submucosa and muscularis propria.  The pathologist wondered about a systemic eosinophilic disorder.  The patient reports he has had some abdominal discomfort with bowel movements though notes overall he is much improved.  He notes he is passing gas and having bowel movements.  He is urinating well.  He does report a small thin red streak over the top of his abdomen that is very faint. ? ?Social History  ? ?Tobacco Use  ?Smoking Status Every Day  ? Packs/day: 1.50  ? Years: 15.00  ? Pack years: 22.50  ? Types: Cigarettes  ?Smokeless Tobacco Former  ? Types: Chew  ? Quit date: 08/12/2017  ?Tobacco Comments  ? 1 pack per day--09/08/2019  ? ? ?Current Outpatient Medications on File Prior to Visit  ?Medication Sig Dispense Refill  ? acetaminophen (TYLENOL) 325 MG tablet Take 2 tablets (650 mg total) by mouth every 8 (eight) hours as needed for mild pain. 40 tablet 0  ? ARIPiprazole (ABILIFY) 10 MG tablet Take 10 mg by mouth every morning.    ? diazepam (VALIUM) 10 MG tablet Take 10 mg by mouth at bedtime.    ? diazepam (VALIUM) 2 MG tablet Take 2 mg by mouth in the morning. (Take with '5mg'$  tablet to equal '7mg'$  in morning)    ? diazepam (VALIUM) 5 MG tablet Take 5 mg by mouth in the morning. (Take with '2mg'$  tablet to equal '7mg'$  in morning)    ? DULoxetine (CYMBALTA) 20 MG capsule Take 20 mg by mouth 2 (two) times daily. (Take with '60mg'$  capsules to equal '80mg'$  total)    ? DULoxetine (CYMBALTA) 60 MG capsule Take 60 mg by mouth 2 (two) times daily. (Take with '20mg'$  capsules to equal '80mg'$  total)    ? famotidine-calcium carbonate-magnesium hydroxide (PEPCID COMPLETE) 10-800-165 MG chewable tablet Chew 1 tablet by mouth daily as needed (heartburn or  indigestion).    ? omeprazole (PRILOSEC) 40 MG capsule TAKE 1 CAPSULE BY MOUTH ONCE DAILY 30 capsule 2  ? prazosin (MINIPRESS) 2 MG capsule Take 2 mg by mouth at bedtime. (Take with '5mg'$  capsule to equal '7mg'$  total)    ? prazosin (MINIPRESS) 5 MG capsule Take 5 mg by mouth at bedtime. (Take with '2mg'$  capsule to equal '7mg'$  total)    ? ?No current facility-administered medications on file prior to visit.  ? ? ? ?ROS see history of present illness ? ?Objective ? ?Physical Exam ?Vitals:  ? 05/09/21 1515 05/09/21 1534  ?BP: 130/80   ?Pulse: (!) 111 88  ?Temp: 98.7 ?F (37.1 ?C)   ?SpO2: 98%   ? ? ?BP Readings from Last 3 Encounters:  ?05/09/21 130/80  ?04/30/21 111/62  ?04/29/21 140/70  ? ?Wt Readings from Last 3 Encounters:  ?05/09/21 205 lb 3.2 oz (93.1 kg)  ?04/29/21 210 lb (95.3 kg)  ?04/29/21 209 lb 12.8 oz (95.2 kg)  ? ? ?Physical Exam ?Constitutional:   ?   General: He is not in acute distress. ?   Appearance: He is not diaphoretic.  ?Cardiovascular:  ?   Rate and Rhythm: Normal rate and regular rhythm.  ?   Heart sounds: Normal heart sounds.  ?Pulmonary:  ?   Effort: Pulmonary effort is normal.  ?Abdominal:  ?   General: Bowel sounds are  normal.  ?   Palpations: Abdomen is soft.  ? ? ?   Comments: Well-healing laparoscopic incisions on his left hemiabdomen with no signs of infection, slight discomfort on palpation of his abdomen, no distention  ?Skin: ?   General: Skin is warm and dry.  ?Neurological:  ?   Mental Status: He is alert.  ? ? ? ?Assessment/Plan: Please see individual problem list. ? ?Problem List Items Addressed This Visit   ? ? Eosinophilia - Primary  ?  On review of prior eosinophil counts it appears that he has fluctuated between mildly elevated and normal levels.  Given the recent pathology findings on his appendectomy and the intermittent elevations in eosinophil counts I will refer to hematology to get their input. ?  ?  ? Relevant Orders  ? Ambulatory referral to Hematology / Oncology  ? Ambulatory  referral to Gastroenterology  ? S/P appendectomy  ?  Patient is doing significantly better after appendectomy.  Discussed that he should continue to improve.  Advised if he develops any signs of infection at the site of his incisions or if he develops any abdominal pain he needs to be evaluated immediately.  I will refer him to the general surgeon for follow-up as well.  Given the eosinophilic infiltrate noted on pathology I will refer to GI for further evaluation. ?  ?  ? Relevant Orders  ? Ambulatory referral to General Surgery  ? Ambulatory referral to Gastroenterology  ? ? ? ?Return in about 3 months (around 08/09/2021). ? ?This visit occurred during the SARS-CoV-2 public health emergency.  Safety protocols were in place, including screening questions prior to the visit, additional usage of staff PPE, and extensive cleaning of exam room while observing appropriate contact time as indicated for disinfecting solutions.  ? ? ?Tommi Rumps, MD ?[place] ? ?

## 2021-05-11 ENCOUNTER — Encounter: Payer: Self-pay | Admitting: *Deleted

## 2021-05-13 DIAGNOSIS — F411 Generalized anxiety disorder: Secondary | ICD-10-CM | POA: Diagnosis not present

## 2021-05-22 NOTE — Progress Notes (Signed)
?[place]  ?[phone] [phone] ? ?ID: Michael [name] OB: 07/21/1990  MR#: [id number]  JKQ#:[id number] ? ?Patient Care Team: ?[name], MD as PCP - General (Family Medicine) ?Lloyd Huger, MD as Consulting Physician (Oncology) ? ?CHIEF COMPLAINT: Eosinophilia. ? ?INTERVAL HISTORY: Patient is a 31 year old male who was noted to have a persistent eosinophilia on routine blood work.  He complains of "pain all over", particularly in his bones but otherwise feels well.  He has no neurologic complaints.  He denies any recent fevers or illnesses.  He has a good appetite and denies weight loss.  He has no new medications.  He denies any chest pain, shortness of breath, cough, or hemoptysis.  He denies any nausea, vomiting, constipation, or diarrhea.  He has no urinary complaints.  Patient otherwise feels well and offers no further specific complaints today. ? ?REVIEW OF SYSTEMS:   ?Review of Systems  ?Constitutional: Negative.  Negative for fever, malaise/fatigue and weight loss.  ?Respiratory: Negative.  Negative for cough, hemoptysis and shortness of breath.   ?Cardiovascular: Negative.  Negative for chest pain and leg swelling.  ?Gastrointestinal: Negative.  Negative for abdominal pain.  ?Genitourinary: Negative.   ?Musculoskeletal:  Positive for myalgias.  ?Skin: Negative.  Negative for rash.  ?Neurological: Negative.  Negative for dizziness, focal weakness, weakness and headaches.  ?Psychiatric/Behavioral: Negative.  The patient is not nervous/anxious.   ? ?As per HPI. Otherwise, a complete review of systems is negative. ? ?PAST MEDICAL HISTORY: ?Past Medical History:  ?Diagnosis Date  ? Allergy   ? Anal fissure 04/06/2017  ? Anxiety   ? Asthma   ? Chest pain off and on  ? COVID-19   ? 02/2020  ? Depression   ? Eosinophilia   ? Family history of adverse reaction to anesthesia   ? adopted history unknown  ? GERD (gastroesophageal reflux disease)   ?  Pancreatitis last 2015  ? Substance abuse ([place])   ? per pt heroin/cocaine age [age]   ? ? ?PAST SURGICAL HISTORY: ?Past Surgical History:  ?Procedure Laterality Date  ? CHOLECYSTECTOMY  08/16/2015  ? Procedure: LAPAROSCOPIC CHOLECYSTECTOMY;  Surgeon: Alphonsa Overall, MD;  Location: [facility];  Service: General;;  ? ESOPHAGOGASTRODUODENOSCOPY (EGD) WITH PROPOFOL N/A 07/27/2016  ? Procedure: ESOPHAGOGASTRODUODENOSCOPY (EGD) WITH PROPOFOL;  Surgeon: Milus Banister, MD;  Location: [facility];  Service: Endoscopy;  Laterality: N/A;  ? ESOPHAGOGASTRODUODENOSCOPY (EGD) WITH PROPOFOL N/A 10/10/2017  ? Procedure: ESOPHAGOGASTRODUODENOSCOPY (EGD) WITH PROPOFOL;  Surgeon: Virgel Manifold, MD;  Location: [facility];  Service: Endoscopy;  Laterality: N/A;  ? TONSILLECTOMY AND ADENOIDECTOMY  as child  ? and adenoids  ? XI ROBOTIC LAPAROSCOPIC ASSISTED APPENDECTOMY N/A 04/29/2021  ? Procedure: XI ROBOTIC LAPAROSCOPIC ASSISTED APPENDECTOMY;  Surgeon: Benjamine Sprague, DO;  Location: [facility];  Service: General;  Laterality: N/A;  ? ? ?FAMILY HISTORY: ?Family History  ?Adopted: Yes  ?Problem Relation Age of Onset  ? Diabetes Paternal Grandfather   ? Lung cancer Paternal Grandfather   ? Ulcers Father   ? Colon cancer Neg Hx   ? ? ?ADVANCED DIRECTIVES (Y/N):  N ? ?HEALTH MAINTENANCE: ?Social History  ? ?Tobacco Use  ? Smoking status: Every Day  ?  Packs/day: 1.50  ?  Years: 15.00  ?  Pack years: 22.50  ?  Types: Cigarettes  ? Smokeless tobacco: Former  ?  Types: Chew  ?  Quit date: 08/12/2017  ? Tobacco comments:  ?  1 pack per day--09/08/2019  ?  Vaping Use  ? Vaping Use: Never used  ?Substance Use Topics  ? Alcohol use: Not Currently  ?  Alcohol/week: 24.0 standard drinks  ?  Types: 24 Cans of beer per week  ? Drug use: Yes  ?  Types: Marijuana  ?  Comment: heroin, cocaine, acid, pain killers; last use yrs ago, marijuana daily use 08/13/18   ? ? ? Colonoscopy: ? PAP: ? Bone density: ? Lipid panel: ? ?Allergies  ?Allergen Reactions  ? Bee  Venom Anaphylaxis  ? Quetiapine Rash  ? Serotonin Reuptake Inhibitors (Ssris) Rash  ? Amoxicillin Diarrhea  ?  Has patient had a PCN reaction causing immediate rash, facial/tongue/throat swelling, SOB or lightheadedness with hypotension: No ?Has patient had a PCN reaction causing severe rash involving mucus membranes or skin necrosis: No ?Has patient had a PCN reaction that required hospitalization: No ?Has patient had a PCN reaction occurring within the last 10 years: Yes ?If all of the above answers are "NO", then may proceed with Cephalosporin use. ?  ? Gabapentin Nausea And Vomiting  ?  diarrhea  ? Prednisone Other (See Comments)  ?  Difficulty breathing  ? Anette Guarneri [Lurasidone Hcl] Rash and Nausea And Vomiting  ? ? ?Current Outpatient Medications  ?Medication Sig Dispense Refill  ? acetaminophen (TYLENOL) 325 MG tablet Take 2 tablets (650 mg total) by mouth every 8 (eight) hours as needed for mild pain. 40 tablet 0  ? diazepam (VALIUM) 10 MG tablet Take 10 mg by mouth at bedtime.    ? diazepam (VALIUM) 2 MG tablet Take 2 mg by mouth in the morning. (Take with '5mg'$  tablet to equal '7mg'$  in morning)    ? diazepam (VALIUM) 5 MG tablet Take 5 mg by mouth in the morning. (Take with '2mg'$  tablet to equal '7mg'$  in morning)    ? DULoxetine (CYMBALTA) 20 MG capsule Take 20 mg by mouth 2 (two) times daily. (Take with '60mg'$  capsules to equal '80mg'$  total)    ? DULoxetine (CYMBALTA) 60 MG capsule Take 60 mg by mouth 2 (two) times daily. (Take with '20mg'$  capsules to equal '80mg'$  total)    ? famotidine-calcium carbonate-magnesium hydroxide (PEPCID COMPLETE) 10-800-165 MG chewable tablet Chew 1 tablet by mouth daily as needed (heartburn or indigestion).    ? omeprazole (PRILOSEC) 40 MG capsule TAKE 1 CAPSULE BY MOUTH ONCE DAILY 30 capsule 2  ? prazosin (MINIPRESS) 2 MG capsule Take 2 mg by mouth at bedtime. (Take with '5mg'$  capsule to equal '7mg'$  total)    ? prazosin (MINIPRESS) 5 MG capsule Take 5 mg by mouth at bedtime. (Take with '2mg'$  capsule  to equal '7mg'$  total)    ? ARIPiprazole (ABILIFY) 10 MG tablet Take 10 mg by mouth every morning. (Patient not taking: Reported on 05/24/2021)    ? ?No current facility-administered medications for this visit.  ? ? ?OBJECTIVE: ?Vitals:  ? 05/25/21 1111  ?BP: (!) 143/98  ?Pulse: 94  ?Resp: 16  ?Temp: (!) 97.5 ?F (36.4 ?C)  ?SpO2: 98%  ?   Body mass index is 33.2 kg/m?Marland Kitchen    ECOG FS:0 - Asymptomatic ? ?General: Well-developed, well-nourished, no acute distress. ?Eyes: Pink conjunctiva, anicteric sclera. ?HEENT: Normocephalic, moist mucous membranes. ?Lungs: No audible wheezing or coughing. ?Heart: Regular rate and rhythm. ?Abdomen: Soft, nontender, no obvious distention. ?Musculoskeletal: No edema, cyanosis, or clubbing. ?Neuro: Alert, answering all questions appropriately. Cranial nerves grossly intact. ?Skin: No rashes or petechiae noted. ?Psych: Normal affect. ?Lymphatics: No cervical, calvicular, axillary or inguinal LAD. ? ? ?  LAB RESULTS: ? ?Lab Results  ?Component Value Date  ? NA 131 (L) 04/30/2021  ? K 4.2 04/30/2021  ? CL 101 04/30/2021  ? CO2 23 04/30/2021  ? GLUCOSE 141 (H) 04/30/2021  ? BUN 14 04/30/2021  ? CREATININE 1.01 04/30/2021  ? CALCIUM 8.7 (L) 04/30/2021  ? PROT 7.3 04/29/2021  ? ALBUMIN 4.8 04/29/2021  ? AST 42 (H) 04/29/2021  ? ALT 92 (H) 04/29/2021  ? ALKPHOS 75 04/29/2021  ? BILITOT 0.4 04/29/2021  ? GFRNONAA >60 04/30/2021  ? GFRAA >60 10/30/2017  ? ? ?Lab Results  ?Component Value Date  ? WBC 12.3 (H) 05/25/2021  ? NEUTROABS 7.8 (H) 05/25/2021  ? HGB 16.2 05/25/2021  ? HCT 47.0 05/25/2021  ? MCV 94.4 05/25/2021  ? PLT 223 05/25/2021  ? ? ? ?STUDIES: ?CT Abdomen Pelvis W Contrast ? ?Result Date: 04/29/2021 ?CLINICAL DATA:  Acute generalized abdominal pain. EXAM: CT ABDOMEN AND PELVIS WITH CONTRAST TECHNIQUE: Multidetector CT imaging of the abdomen and pelvis was performed using the standard protocol following bolus administration of intravenous contrast. RADIATION DOSE REDUCTION: This exam was  performed according to the departmental dose-optimization program which includes automated exposure control, adjustment of the mA and/or kV according to patient size and/or use of iterative reconstruction techniq

## 2021-05-23 ENCOUNTER — Ambulatory Visit: Payer: Medicare Other | Admitting: Dermatology

## 2021-05-24 ENCOUNTER — Encounter: Payer: Self-pay | Admitting: Oncology

## 2021-05-24 ENCOUNTER — Ambulatory Visit: Payer: Medicare Other | Admitting: Family Medicine

## 2021-05-24 NOTE — Progress Notes (Signed)
Pt reports extreme worsening fatigue and abdominal pain since prior to appendectomy(04/29/2021).  C/o headaches, N/V, poor appetite, tingling sensation in left and right foot.  ?

## 2021-05-25 ENCOUNTER — Inpatient Hospital Stay: Payer: Medicare Other

## 2021-05-25 ENCOUNTER — Inpatient Hospital Stay: Payer: Medicare Other | Attending: Oncology | Admitting: Oncology

## 2021-05-25 ENCOUNTER — Encounter: Payer: Self-pay | Admitting: Oncology

## 2021-05-25 VITALS — BP 143/98 | HR 94 | Temp 97.5°F | Resp 16 | Ht 67.0 in | Wt 212.0 lb

## 2021-05-25 DIAGNOSIS — Z91030 Bee allergy status: Secondary | ICD-10-CM | POA: Insufficient documentation

## 2021-05-25 DIAGNOSIS — D729 Disorder of white blood cells, unspecified: Secondary | ICD-10-CM

## 2021-05-25 DIAGNOSIS — M791 Myalgia, unspecified site: Secondary | ICD-10-CM | POA: Insufficient documentation

## 2021-05-25 DIAGNOSIS — F419 Anxiety disorder, unspecified: Secondary | ICD-10-CM | POA: Insufficient documentation

## 2021-05-25 DIAGNOSIS — K219 Gastro-esophageal reflux disease without esophagitis: Secondary | ICD-10-CM | POA: Insufficient documentation

## 2021-05-25 DIAGNOSIS — Z79899 Other long term (current) drug therapy: Secondary | ICD-10-CM | POA: Insufficient documentation

## 2021-05-25 DIAGNOSIS — Z880 Allergy status to penicillin: Secondary | ICD-10-CM | POA: Diagnosis not present

## 2021-05-25 DIAGNOSIS — D721 Eosinophilia, unspecified: Secondary | ICD-10-CM | POA: Insufficient documentation

## 2021-05-25 DIAGNOSIS — Z9049 Acquired absence of other specified parts of digestive tract: Secondary | ICD-10-CM | POA: Diagnosis not present

## 2021-05-25 DIAGNOSIS — Z801 Family history of malignant neoplasm of trachea, bronchus and lung: Secondary | ICD-10-CM | POA: Insufficient documentation

## 2021-05-25 DIAGNOSIS — Z8489 Family history of other specified conditions: Secondary | ICD-10-CM | POA: Diagnosis not present

## 2021-05-25 DIAGNOSIS — Z833 Family history of diabetes mellitus: Secondary | ICD-10-CM | POA: Insufficient documentation

## 2021-05-25 DIAGNOSIS — J45909 Unspecified asthma, uncomplicated: Secondary | ICD-10-CM | POA: Diagnosis not present

## 2021-05-25 DIAGNOSIS — Z8616 Personal history of COVID-19: Secondary | ICD-10-CM | POA: Insufficient documentation

## 2021-05-25 DIAGNOSIS — Z888 Allergy status to other drugs, medicaments and biological substances status: Secondary | ICD-10-CM | POA: Diagnosis not present

## 2021-05-25 DIAGNOSIS — F17210 Nicotine dependence, cigarettes, uncomplicated: Secondary | ICD-10-CM | POA: Insufficient documentation

## 2021-05-25 LAB — FOLATE: Folate: 9.3 ng/mL (ref >5.9)

## 2021-05-25 LAB — CBC WITH DIFFERENTIAL/PLATELET
Abs Immature Granulocytes: 0.08 10*3/uL — ABNORMAL HIGH (ref 0.00–0.07)
Basophils Absolute: 0.1 10*3/uL (ref 0.0–0.1)
Basophils Relative: 1 %
Eosinophils Absolute: 0.8 10*3/uL — ABNORMAL HIGH (ref 0.0–0.5)
Eosinophils Relative: 6 %
HCT: 47 % (ref 39.0–52.0)
Hemoglobin: 16.2 g/dL (ref 13.0–17.0)
Immature Granulocytes: 1 %
Lymphocytes Relative: 21 %
Lymphs Abs: 2.6 10*3/uL (ref 0.7–4.0)
MCH: 32.5 pg (ref 26.0–34.0)
MCHC: 34.5 g/dL (ref 30.0–36.0)
MCV: 94.4 fL (ref 80.0–100.0)
Monocytes Absolute: 1 10*3/uL (ref 0.1–1.0)
Monocytes Relative: 8 %
Neutro Abs: 7.8 10*3/uL — ABNORMAL HIGH (ref 1.7–7.7)
Neutrophils Relative %: 63 %
Platelets: 223 10*3/uL (ref 150–400)
RBC: 4.98 MIL/uL (ref 4.22–5.81)
RDW: 13.2 % (ref 11.5–15.5)
WBC: 12.3 10*3/uL — ABNORMAL HIGH (ref 4.0–10.5)
nRBC: 0 % (ref 0.0–0.2)

## 2021-05-25 LAB — VITAMIN B12: Vitamin B-12: 204 pg/mL (ref 180–914)

## 2021-05-25 LAB — LACTATE DEHYDROGENASE: LDH: 122 U/L (ref 98–192)

## 2021-05-26 LAB — TRYPTASE: Tryptase: 3.9 ug/L (ref 2.2–13.2)

## 2021-05-27 LAB — COMP PANEL: LEUKEMIA/LYMPHOMA

## 2021-06-01 ENCOUNTER — Telehealth: Payer: Self-pay | Admitting: Family Medicine

## 2021-06-01 NOTE — Telephone Encounter (Signed)
Copied from [place] [id number]. Topic: Medicare AWV ?>> Jun 01, 2021  9:49 AM Harris-Coley, Hannah Beat wrote: ?Reason for CRM: Left message for patient to schedule Annual Wellness Visit.  Please schedule with Nurse Health Advisor Denisa O'Brien-Blaney, LPN at [facility].  Please call [phone] ask for Juliann Pulse ?

## 2021-06-10 DIAGNOSIS — F411 Generalized anxiety disorder: Secondary | ICD-10-CM | POA: Diagnosis not present

## 2021-06-11 ENCOUNTER — Encounter: Payer: Self-pay | Admitting: Family Medicine

## 2021-06-21 ENCOUNTER — Inpatient Hospital Stay: Payer: Medicare Other | Attending: Oncology | Admitting: Oncology

## 2021-06-21 DIAGNOSIS — D721 Eosinophilia, unspecified: Secondary | ICD-10-CM

## 2021-06-21 NOTE — Progress Notes (Signed)
?[place]  ?[phone]) [phone] [phone] ? ?ID: [name] Antonucci OB: September 17, 1990  MR#: [id number]  MIW#:[id number] ? ?Patient Care Team: ?[name], MD as PCP - General (Family Medicine) ?Lloyd Huger, MD as Consulting Physician (Oncology) ? ?CHIEF COMPLAINT: Eosinophilia. ? ?I connected with Michael [name] on 06/21/21 at  2:45 PM EDT by telephone visit and verified that I am speaking with the correct person using two identifiers.  ? ?I discussed the limitations, risks, security and privacy concerns of performing an evaluation and management service by telemedicine and the availability of in-person appointments. I also discussed with the patient that there may be a patient responsible charge related to this service. The patient expressed understanding and agreed to proceed.  ? ?Other persons participating in the visit and their role in the encounter: NP, Patient   ? ?Patient?s location: Home  ?Provider?s location: Clinic   ? ?INTERVAL HISTORY: Here for follow-up and discuss labs for eosinophilia work-up. Recently hospitalized for appendicitis.  Labs showed persistent eosinophilia.  Having allover body pain changing locations throughout the day. ? ?States today he feels more or less the same.  Eating and drinking well.  Denies any weight loss.  No new symptoms.  Has neurology appointment later this month.  Will see GI in July. ? ?REVIEW OF SYSTEMS:   ?Review of Systems  ?Constitutional:  Positive for malaise/fatigue. Negative for chills, fever and weight loss.  ?HENT:  Negative for congestion, ear pain and tinnitus.   ?Eyes: Negative.  Negative for blurred vision and double vision.  ?Respiratory: Negative.  Negative for cough, sputum production and shortness of breath.   ?Cardiovascular: Negative.  Negative for chest pain, palpitations and leg swelling.  ?Gastrointestinal: Negative.  Negative for abdominal pain, constipation, diarrhea, nausea and vomiting.   ?Genitourinary:  Negative for dysuria, frequency and urgency.  ?Musculoskeletal:  Positive for joint pain. Negative for back pain and falls.  ?Skin: Negative.  Negative for rash.  ?Neurological: Negative.  Negative for weakness and headaches.  ?Endo/Heme/Allergies: Negative.  Does not bruise/bleed easily.  ?Psychiatric/Behavioral: Negative.  Negative for depression. The patient is not nervous/anxious and does not have insomnia.   ? ?As per HPI. Otherwise, a complete review of systems is negative. ? ?PAST MEDICAL HISTORY: ?Past Medical History:  ?Diagnosis Date  ? Allergy   ? Anal fissure 04/06/2017  ? Anxiety   ? Asthma   ? Chest pain off and on  ? COVID-19   ? 02/2020  ? Depression   ? Eosinophilia   ? Family history of adverse reaction to anesthesia   ? adopted history unknown  ? GERD (gastroesophageal reflux disease)   ? Pancreatitis last 2015  ? Substance abuse ([place])   ? per pt heroin/cocaine age [age]   ? ? ?PAST SURGICAL HISTORY: ?Past Surgical History:  ?Procedure Laterality Date  ? CHOLECYSTECTOMY  08/16/2015  ? Procedure: LAPAROSCOPIC CHOLECYSTECTOMY;  Surgeon: Alphonsa Overall, MD;  Location: [facility];  Service: General;;  ? ESOPHAGOGASTRODUODENOSCOPY (EGD) WITH PROPOFOL N/A 07/27/2016  ? Procedure: ESOPHAGOGASTRODUODENOSCOPY (EGD) WITH PROPOFOL;  Surgeon: Milus Banister, MD;  Location: [facility];  Service: Endoscopy;  Laterality: N/A;  ? ESOPHAGOGASTRODUODENOSCOPY (EGD) WITH PROPOFOL N/A 10/10/2017  ? Procedure: ESOPHAGOGASTRODUODENOSCOPY (EGD) WITH PROPOFOL;  Surgeon: Virgel Manifold, MD;  Location: [facility];  Service: Endoscopy;  Laterality: N/A;  ? TONSILLECTOMY AND ADENOIDECTOMY  as child  ? and adenoids  ? XI ROBOTIC LAPAROSCOPIC ASSISTED APPENDECTOMY N/A 04/29/2021  ? Procedure: XI ROBOTIC LAPAROSCOPIC  ASSISTED APPENDECTOMY;  Surgeon: Benjamine Sprague, DO;  Location: [facility];  Service: General;  Laterality: N/A;  ? ? ?FAMILY HISTORY: ?Family History  ?Adopted: Yes  ?Problem Relation Age of Onset   ? Diabetes Paternal Grandfather   ? Lung cancer Paternal Grandfather   ? Ulcers Father   ? Colon cancer Neg Hx   ? ? ?ADVANCED DIRECTIVES (Y/N):  N ? ?HEALTH MAINTENANCE: ?Social History  ? ?Tobacco Use  ? Smoking status: Every Day  ?  Packs/day: 1.50  ?  Years: 15.00  ?  Pack years: 22.50  ?  Types: Cigarettes  ? Smokeless tobacco: Former  ?  Types: Chew  ?  Quit date: 08/12/2017  ? Tobacco comments:  ?  1 pack per day--09/08/2019  ?Vaping Use  ? Vaping Use: Never used  ?Substance Use Topics  ? Alcohol use: Not Currently  ?  Alcohol/week: 24.0 standard drinks  ?  Types: 24 Cans of beer per week  ? Drug use: Yes  ?  Types: Marijuana  ?  Comment: heroin, cocaine, acid, pain killers; last use yrs ago, marijuana daily use 08/13/18   ? ? ? Colonoscopy: ? PAP: ? Bone density: ? Lipid panel: ? ?Allergies  ?Allergen Reactions  ? Bee Venom Anaphylaxis  ? Quetiapine Rash  ? Serotonin Reuptake Inhibitors (Ssris) Rash  ? Amoxicillin Diarrhea  ?  Has patient had a PCN reaction causing immediate rash, facial/tongue/throat swelling, SOB or lightheadedness with hypotension: No ?Has patient had a PCN reaction causing severe rash involving mucus membranes or skin necrosis: No ?Has patient had a PCN reaction that required hospitalization: No ?Has patient had a PCN reaction occurring within the last 10 years: Yes ?If all of the above answers are "NO", then may proceed with Cephalosporin use. ?  ? Gabapentin Nausea And Vomiting  ?  diarrhea  ? Prednisone Other (See Comments)  ?  Difficulty breathing  ? Anette Guarneri [Lurasidone Hcl] Rash and Nausea And Vomiting  ? ? ?Current Outpatient Medications  ?Medication Sig Dispense Refill  ? ARIPiprazole (ABILIFY) 10 MG tablet Take 10 mg by mouth every morning. (Patient not taking: Reported on 05/24/2021)    ? diazepam (VALIUM) 10 MG tablet Take 10 mg by mouth at bedtime.    ? diazepam (VALIUM) 2 MG tablet Take 2 mg by mouth in the morning. (Take with 63m tablet to equal 7768min morning)    ? diazepam  (VALIUM) 5 MG tablet Take 5 mg by mouth in the morning. (Take with 68m31mablet to equal 7mg45m morning)    ? DULoxetine (CYMBALTA) 20 MG capsule Take 20 mg by mouth 2 (two) times daily. (Take with 60mg868msules to equal 80mg 73ml)    ? DULoxetine (CYMBALTA) 60 MG capsule Take 60 mg by mouth 2 (two) times daily. (Take with 20mg c41mles to equal 80mg to53m    ? famotidine-calcium carbonate-magnesium hydroxide (PEPCID COMPLETE) 10-800-165 MG chewable tablet Chew 1 tablet by mouth daily as needed (heartburn or indigestion).    ? omeprazole (PRILOSEC) 40 MG capsule TAKE 1 CAPSULE BY MOUTH ONCE DAILY 30 capsule 2  ? prazosin (MINIPRESS) 2 MG capsule Take 2 mg by mouth at bedtime. (Take with 5mg caps43m to equal 7mg total105m  ? prazosin (MINIPRESS) 5 MG capsule Take 5 mg by mouth at bedtime. (Take with 68mg capsul103mo equal 7mg total) 56m? ?No current facility-administered medications for this visit.  ? ? ?OBJECTIVE: ?There were no vitals filed for this  visit. ?   There is no height or weight on file to calculate BMI.    ECOG FS:0 - Asymptomatic ? ?Physical Exam ?Neurological:  ?   Mental Status: He is oriented to person, place, and time.  ? ? ? ?LAB RESULTS: ? ?Lab Results  ?Component Value Date  ? NA 131 (L) 04/30/2021  ? K 4.2 04/30/2021  ? CL 101 04/30/2021  ? CO2 23 04/30/2021  ? GLUCOSE 141 (H) 04/30/2021  ? BUN 14 04/30/2021  ? CREATININE 1.01 04/30/2021  ? CALCIUM 8.7 (L) 04/30/2021  ? PROT 7.3 04/29/2021  ? ALBUMIN 4.8 04/29/2021  ? AST 42 (H) 04/29/2021  ? ALT 92 (H) 04/29/2021  ? ALKPHOS 75 04/29/2021  ? BILITOT 0.4 04/29/2021  ? GFRNONAA >60 04/30/2021  ? GFRAA >60 10/30/2017  ? ? ?Lab Results  ?Component Value Date  ? WBC 12.3 (H) 05/25/2021  ? NEUTROABS 7.8 (H) 05/25/2021  ? HGB 16.2 05/25/2021  ? HCT 47.0 05/25/2021  ? MCV 94.4 05/25/2021  ? PLT 223 05/25/2021  ? ? ? ?STUDIES: ?No results found. ? ?ASSESSMENT: Eosinophilia. ? ?PLAN:   ? ?Eosinophilia: Unclear etiology.  Lab work to date shows a normal LDH  B12 and folate level.  Tryptase levels are also within normal range.  Flow cytometry did not reveal any monoclonal B-cell population.  Continue to monitor at this time.  Does not need bone marrow biopsy.  Recommend
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
## 2021-07-04 ENCOUNTER — Encounter: Payer: Self-pay | Admitting: Neurology

## 2021-07-04 ENCOUNTER — Ambulatory Visit (INDEPENDENT_AMBULATORY_CARE_PROVIDER_SITE_OTHER): Payer: Medicare Other | Admitting: Neurology

## 2021-07-04 VITALS — BP 152/96 | HR 88 | Ht 67.0 in | Wt 225.4 lb

## 2021-07-04 DIAGNOSIS — R531 Weakness: Secondary | ICD-10-CM

## 2021-07-04 DIAGNOSIS — R413 Other amnesia: Secondary | ICD-10-CM | POA: Diagnosis not present

## 2021-07-04 DIAGNOSIS — E538 Deficiency of other specified B group vitamins: Secondary | ICD-10-CM

## 2021-07-04 DIAGNOSIS — G43109 Migraine with aura, not intractable, without status migrainosus: Secondary | ICD-10-CM

## 2021-07-04 MED ORDER — NORTRIPTYLINE HCL 10 MG PO CAPS: 10.0000 mg | ORAL_CAPSULE | Freq: Every day | ORAL | 5 refills | Status: DC

## 2021-07-04 MED ORDER — RIZATRIPTAN BENZOATE 10 MG PO TABS: 10.0000 mg | ORAL_TABLET | ORAL | 5 refills | Status: DC | PRN

## 2021-07-04 NOTE — Patient Instructions (Signed)
Start nortriptyline '10mg'$  at bedtime.  If no improvement in headaches in 4 weeks, contact me and we can increase dosoe ?Memory problems likely multifactorial - related to concussion, low B12 and Valium.  I would start over the counter B12 1057mg daily ?Limit use of pain relievers (such as Excedrin, Tylenol, Advil, Aleve) to no more than 2 days out of week to prevent risk of rebound or medication-overuse headache. ?Keep headache journal ?Follow up 4 to 5 months. ?

## 2021-07-09 DIAGNOSIS — R569 Unspecified convulsions: Secondary | ICD-10-CM | POA: Diagnosis not present

## 2021-07-09 DIAGNOSIS — G40909 Epilepsy, unspecified, not intractable, without status epilepticus: Secondary | ICD-10-CM | POA: Diagnosis not present

## 2021-07-09 DIAGNOSIS — R079 Chest pain, unspecified: Secondary | ICD-10-CM | POA: Diagnosis not present

## 2021-07-19 ENCOUNTER — Observation Stay
Admission: EM | Admit: 2021-07-19 | Discharge: 2021-07-21 | Disposition: A | Discharge status: Home / Self Care | Payer: Medicare Other | Attending: Internal Medicine | Admitting: Internal Medicine

## 2021-07-19 ENCOUNTER — Ambulatory Visit: Payer: Medicare Other | Admitting: Neurology

## 2021-07-19 DIAGNOSIS — R079 Chest pain, unspecified: Secondary | ICD-10-CM | POA: Diagnosis not present

## 2021-07-19 DIAGNOSIS — Z8616 Personal history of COVID-19: Secondary | ICD-10-CM | POA: Insufficient documentation

## 2021-07-19 DIAGNOSIS — R404 Transient alteration of awareness: Secondary | ICD-10-CM | POA: Diagnosis not present

## 2021-07-19 DIAGNOSIS — W01190A Fall on same level from slipping, tripping and stumbling with subsequent striking against furniture, initial encounter: Secondary | ICD-10-CM | POA: Diagnosis not present

## 2021-07-19 DIAGNOSIS — R200 Anesthesia of skin: Secondary | ICD-10-CM | POA: Diagnosis present

## 2021-07-19 DIAGNOSIS — J45909 Unspecified asthma, uncomplicated: Secondary | ICD-10-CM | POA: Diagnosis not present

## 2021-07-19 DIAGNOSIS — F17210 Nicotine dependence, cigarettes, uncomplicated: Secondary | ICD-10-CM | POA: Diagnosis not present

## 2021-07-19 DIAGNOSIS — G8384 Todd's paralysis (postepileptic): Secondary | ICD-10-CM | POA: Insufficient documentation

## 2021-07-19 DIAGNOSIS — Y908 Blood alcohol level of 240 mg/100 ml or more: Secondary | ICD-10-CM | POA: Diagnosis not present

## 2021-07-19 DIAGNOSIS — F10129 Alcohol abuse with intoxication, unspecified: Secondary | ICD-10-CM | POA: Diagnosis not present

## 2021-07-19 DIAGNOSIS — W19XXXA Unspecified fall, initial encounter: Secondary | ICD-10-CM | POA: Diagnosis not present

## 2021-07-19 DIAGNOSIS — Z79899 Other long term (current) drug therapy: Secondary | ICD-10-CM | POA: Insufficient documentation

## 2021-07-19 DIAGNOSIS — J9811 Atelectasis: Secondary | ICD-10-CM | POA: Diagnosis not present

## 2021-07-19 DIAGNOSIS — S199XXA Unspecified injury of neck, initial encounter: Secondary | ICD-10-CM | POA: Diagnosis not present

## 2021-07-19 DIAGNOSIS — R52 Pain, unspecified: Secondary | ICD-10-CM | POA: Diagnosis not present

## 2021-07-19 DIAGNOSIS — J690 Pneumonitis due to inhalation of food and vomit: Secondary | ICD-10-CM | POA: Insufficient documentation

## 2021-07-19 DIAGNOSIS — F1090 Alcohol use, unspecified, uncomplicated: Secondary | ICD-10-CM | POA: Diagnosis present

## 2021-07-19 DIAGNOSIS — E7289 Other specified disorders of amino-acid metabolism: Secondary | ICD-10-CM | POA: Diagnosis not present

## 2021-07-19 DIAGNOSIS — R0789 Other chest pain: Secondary | ICD-10-CM | POA: Diagnosis not present

## 2021-07-19 DIAGNOSIS — R7989 Other specified abnormal findings of blood chemistry: Secondary | ICD-10-CM

## 2021-07-19 DIAGNOSIS — G40909 Epilepsy, unspecified, not intractable, without status epilepticus: Secondary | ICD-10-CM | POA: Diagnosis not present

## 2021-07-19 DIAGNOSIS — F10929 Alcohol use, unspecified with intoxication, unspecified: Secondary | ICD-10-CM

## 2021-07-19 DIAGNOSIS — R4182 Altered mental status, unspecified: Secondary | ICD-10-CM | POA: Diagnosis not present

## 2021-07-19 DIAGNOSIS — J9601 Acute respiratory failure with hypoxia: Secondary | ICD-10-CM | POA: Diagnosis not present

## 2021-07-19 DIAGNOSIS — R569 Unspecified convulsions: Secondary | ICD-10-CM

## 2021-07-19 DIAGNOSIS — Y92009 Unspecified place in unspecified non-institutional (private) residence as the place of occurrence of the external cause: Secondary | ICD-10-CM | POA: Diagnosis not present

## 2021-07-19 DIAGNOSIS — F1320 Sedative, hypnotic or anxiolytic dependence, uncomplicated: Secondary | ICD-10-CM

## 2021-07-19 DIAGNOSIS — R0902 Hypoxemia: Secondary | ICD-10-CM | POA: Diagnosis not present

## 2021-07-19 DIAGNOSIS — I10 Essential (primary) hypertension: Secondary | ICD-10-CM | POA: Diagnosis not present

## 2021-07-20 ENCOUNTER — Emergency Department: Payer: Medicare Other

## 2021-07-20 ENCOUNTER — Other Ambulatory Visit: Payer: Self-pay

## 2021-07-20 DIAGNOSIS — R569 Unspecified convulsions: Secondary | ICD-10-CM

## 2021-07-20 DIAGNOSIS — J9601 Acute respiratory failure with hypoxia: Secondary | ICD-10-CM

## 2021-07-20 DIAGNOSIS — J9811 Atelectasis: Secondary | ICD-10-CM | POA: Diagnosis not present

## 2021-07-20 DIAGNOSIS — R4182 Altered mental status, unspecified: Secondary | ICD-10-CM | POA: Diagnosis not present

## 2021-07-20 DIAGNOSIS — F1320 Sedative, hypnotic or anxiolytic dependence, uncomplicated: Secondary | ICD-10-CM

## 2021-07-20 DIAGNOSIS — R0902 Hypoxemia: Secondary | ICD-10-CM | POA: Diagnosis not present

## 2021-07-20 DIAGNOSIS — S199XXA Unspecified injury of neck, initial encounter: Secondary | ICD-10-CM | POA: Diagnosis not present

## 2021-07-20 DIAGNOSIS — Y92009 Unspecified place in unspecified non-institutional (private) residence as the place of occurrence of the external cause: Secondary | ICD-10-CM

## 2021-07-20 DIAGNOSIS — R404 Transient alteration of awareness: Secondary | ICD-10-CM | POA: Diagnosis not present

## 2021-07-20 DIAGNOSIS — F1090 Alcohol use, unspecified, uncomplicated: Secondary | ICD-10-CM | POA: Diagnosis present

## 2021-07-20 DIAGNOSIS — F10929 Alcohol use, unspecified with intoxication, unspecified: Secondary | ICD-10-CM

## 2021-07-20 DIAGNOSIS — R7989 Other specified abnormal findings of blood chemistry: Secondary | ICD-10-CM

## 2021-07-20 LAB — CBC WITH DIFFERENTIAL/PLATELET
Abs Immature Granulocytes: 0.23 10*3/uL — ABNORMAL HIGH (ref 0.00–0.07)
Basophils Absolute: 0.2 10*3/uL — ABNORMAL HIGH (ref 0.0–0.1)
Basophils Relative: 2 %
Eosinophils Absolute: 1 10*3/uL — ABNORMAL HIGH (ref 0.0–0.5)
Eosinophils Relative: 9 %
HCT: 50.1 % (ref 39.0–52.0)
Hemoglobin: 16.9 g/dL (ref 13.0–17.0)
Immature Granulocytes: 2 %
Lymphocytes Relative: 40 %
Lymphs Abs: 4.5 10*3/uL — ABNORMAL HIGH (ref 0.7–4.0)
MCH: 32.4 pg (ref 26.0–34.0)
MCHC: 33.7 g/dL (ref 30.0–36.0)
MCV: 96.2 fL (ref 80.0–100.0)
Monocytes Absolute: 1 10*3/uL (ref 0.1–1.0)
Monocytes Relative: 9 %
Neutro Abs: 4.3 10*3/uL (ref 1.7–7.7)
Neutrophils Relative %: 38 %
Platelets: 290 10*3/uL (ref 150–400)
RBC: 5.21 MIL/uL (ref 4.22–5.81)
RDW: 13.6 % (ref 11.5–15.5)
WBC: 11.2 10*3/uL — ABNORMAL HIGH (ref 4.0–10.5)
nRBC: 0 % (ref 0.0–0.2)

## 2021-07-20 LAB — BLOOD GAS, ARTERIAL
Acid-base deficit: 0.6 mmol/L (ref 0.0–2.0)
Bicarbonate: 24.9 mmol/L (ref 20.0–28.0)
O2 Saturation: 99 %
Patient temperature: 37
pCO2 arterial: 43 mmHg (ref 32–48)
pH, Arterial: 7.37 (ref 7.35–7.45)
pO2, Arterial: 106 mmHg (ref 83–108)

## 2021-07-20 LAB — CBC
HCT: 47 % (ref 39.0–52.0)
Hemoglobin: 15.6 g/dL (ref 13.0–17.0)
MCH: 32 pg (ref 26.0–34.0)
MCHC: 33.2 g/dL (ref 30.0–36.0)
MCV: 96.5 fL (ref 80.0–100.0)
Platelets: 241 10*3/uL (ref 150–400)
RBC: 4.87 MIL/uL (ref 4.22–5.81)
RDW: 13.8 % (ref 11.5–15.5)
WBC: 7.2 10*3/uL (ref 4.0–10.5)
nRBC: 0 % (ref 0.0–0.2)

## 2021-07-20 LAB — CREATININE, SERUM
Creatinine, Ser: 0.79 mg/dL (ref 0.61–1.24)
GFR, Estimated: >60 mL/min (ref >60)

## 2021-07-20 LAB — ETHANOL: Alcohol, Ethyl (B): 281 mg/dL — ABNORMAL HIGH (ref <10)

## 2021-07-20 LAB — COMPREHENSIVE METABOLIC PANEL
ALT: 55 U/L — ABNORMAL HIGH (ref 0–44)
AST: 28 U/L (ref 15–41)
Albumin: 4.6 g/dL (ref 3.5–5.0)
Alkaline Phosphatase: 74 U/L (ref 38–126)
Anion gap: 11 (ref 5–15)
BUN: 12 mg/dL (ref 6–20)
CO2: 22 mmol/L (ref 22–32)
Calcium: 8.8 mg/dL — ABNORMAL LOW (ref 8.9–10.3)
Chloride: 106 mmol/L (ref 98–111)
Creatinine, Ser: 0.82 mg/dL (ref 0.61–1.24)
GFR, Estimated: >60 mL/min (ref >60)
Glucose, Bld: 98 mg/dL (ref 70–99)
Potassium: 4 mmol/L (ref 3.5–5.1)
Sodium: 139 mmol/L (ref 135–145)
Total Bilirubin: 0.5 mg/dL (ref 0.3–1.2)
Total Protein: 7.8 g/dL (ref 6.5–8.1)

## 2021-07-20 LAB — URINE DRUG SCREEN, QUALITATIVE (ARMC ONLY)
Amphetamines, Ur Screen: NOT DETECTED
Barbiturates, Ur Screen: NOT DETECTED
Benzodiazepine, Ur Scrn: POSITIVE — AB
Cannabinoid 50 Ng, Ur ~~LOC~~: POSITIVE — AB
Cocaine Metabolite,Ur ~~LOC~~: NOT DETECTED
MDMA (Ecstasy)Ur Screen: NOT DETECTED
Methadone Scn, Ur: NOT DETECTED
Opiate, Ur Screen: NOT DETECTED
Phencyclidine (PCP) Ur S: NOT DETECTED
Tricyclic, Ur Screen: NOT DETECTED

## 2021-07-20 LAB — HIV ANTIBODY (ROUTINE TESTING W REFLEX): HIV Screen 4th Generation wRfx: NONREACTIVE

## 2021-07-20 LAB — AMMONIA: Ammonia: 38 umol/L — ABNORMAL HIGH (ref 9–35)

## 2021-07-20 LAB — BRAIN NATRIURETIC PEPTIDE: B Natriuretic Peptide: 9.5 pg/mL (ref 0.0–100.0)

## 2021-07-20 LAB — CBG MONITORING, ED: Glucose-Capillary: 111 mg/dL — ABNORMAL HIGH (ref 70–99)

## 2021-07-20 MED ORDER — LORAZEPAM 2 MG/ML IJ SOLN
0.0000 mg | Freq: Four times a day (QID) | INTRAMUSCULAR | Status: DC
  Administered 2021-07-21: 1 mg via INTRAVENOUS
  Filled 2021-07-20: qty 1

## 2021-07-20 MED ORDER — ONDANSETRON HCL 4 MG/2ML IJ SOLN
4.0000 mg | Freq: Four times a day (QID) | INTRAMUSCULAR | Status: DC | PRN
Start: 2021-07-20 — End: 2021-07-21

## 2021-07-20 MED ORDER — LORAZEPAM 2 MG/ML IJ SOLN: 1.0000 mg | INTRAMUSCULAR | Status: DC | PRN

## 2021-07-20 MED ORDER — LEVETIRACETAM IN NACL 500 MG/100ML IV SOLN
500.0000 mg | Freq: Two times a day (BID) | INTRAVENOUS | Status: DC
  Administered 2021-07-20 – 2021-07-21 (×3): 500 mg via INTRAVENOUS
  Filled 2021-07-20 (×3): qty 100

## 2021-07-20 MED ORDER — LORAZEPAM 1 MG PO TABS
1.0000 mg | ORAL_TABLET | ORAL | Status: DC | PRN
  Administered 2021-07-20: 2 mg via ORAL
  Administered 2021-07-20: 1 mg via ORAL
  Filled 2021-07-20: qty 4
  Filled 2021-07-20: qty 2

## 2021-07-20 MED ORDER — ORAL CARE MOUTH RINSE
15.0000 mL | Freq: Two times a day (BID) | OROMUCOSAL | Status: DC
  Administered 2021-07-20 – 2021-07-21 (×2): 15 mL via OROMUCOSAL

## 2021-07-20 MED ORDER — LEVETIRACETAM IN NACL 1500 MG/100ML IV SOLN
1500.0000 mg | Freq: Once | INTRAVENOUS | Status: AC
  Administered 2021-07-20: 1500 mg via INTRAVENOUS
  Filled 2021-07-20: qty 100

## 2021-07-20 MED ORDER — FOLIC ACID 1 MG PO TABS
1.0000 mg | ORAL_TABLET | Freq: Every day | ORAL | Status: DC
Start: 2021-07-20 — End: 2021-07-21
  Administered 2021-07-20 – 2021-07-21 (×2): 1 mg via ORAL
  Filled 2021-07-20 (×2): qty 1

## 2021-07-20 MED ORDER — DIAZEPAM 5 MG PO TABS
10.0000 mg | ORAL_TABLET | Freq: Every day | ORAL | Status: DC
  Administered 2021-07-20: 10 mg via ORAL
  Filled 2021-07-20: qty 2

## 2021-07-20 MED ORDER — LORAZEPAM 2 MG/ML IJ SOLN: 0.0000 mg | Freq: Two times a day (BID) | INTRAMUSCULAR | Status: DC

## 2021-07-20 MED ORDER — DULOXETINE HCL 60 MG PO CPEP
80.0000 mg | ORAL_CAPSULE | Freq: Two times a day (BID) | ORAL | Status: DC
  Administered 2021-07-20 – 2021-07-21 (×2): 80 mg via ORAL
  Filled 2021-07-20 (×3): qty 1

## 2021-07-20 MED ORDER — FOLIC ACID 1 MG PO TABS: 1.0000 mg | ORAL_TABLET | Freq: Every day | ORAL | Status: DC

## 2021-07-20 MED ORDER — SODIUM CHLORIDE 0.9 % IV SOLN
75.0000 mL/h | INTRAVENOUS | Status: DC
  Administered 2021-07-20: 75 mL/h via INTRAVENOUS

## 2021-07-20 MED ORDER — THIAMINE HCL 100 MG PO TABS: 100.0000 mg | ORAL_TABLET | Freq: Every day | ORAL | Status: DC

## 2021-07-20 MED ORDER — GUAIFENESIN ER 600 MG PO TB12
600.0000 mg | ORAL_TABLET | Freq: Two times a day (BID) | ORAL | Status: DC
  Administered 2021-07-20 – 2021-07-21 (×2): 600 mg via ORAL
  Filled 2021-07-20 (×2): qty 1

## 2021-07-20 MED ORDER — LORAZEPAM 2 MG/ML IJ SOLN
2.0000 mg | INTRAMUSCULAR | Status: DC | PRN
  Filled 2021-07-20: qty 1

## 2021-07-20 MED ORDER — ADULT MULTIVITAMIN W/MINERALS CH
1.0000 | ORAL_TABLET | Freq: Every day | ORAL | Status: DC
  Administered 2021-07-21: 1 via ORAL
  Filled 2021-07-20: qty 1

## 2021-07-20 MED ORDER — ONDANSETRON HCL 4 MG PO TABS
4.0000 mg | ORAL_TABLET | Freq: Four times a day (QID) | ORAL | Status: DC | PRN
Start: 2021-07-20 — End: 2021-07-21
  Administered 2021-07-20: 4 mg via ORAL
  Filled 2021-07-20: qty 1

## 2021-07-20 MED ORDER — LACTATED RINGERS IV BOLUS
1000.0000 mL | Freq: Once | INTRAVENOUS | Status: AC
  Administered 2021-07-20: 1000 mL via INTRAVENOUS

## 2021-07-20 MED ORDER — THIAMINE HCL 100 MG PO TABS
100.0000 mg | ORAL_TABLET | Freq: Every day | ORAL | Status: DC
  Administered 2021-07-20 – 2021-07-21 (×2): 100 mg via ORAL
  Filled 2021-07-20 (×2): qty 1

## 2021-07-20 MED ORDER — ORAL CARE MOUTH RINSE
15.0000 mL | OROMUCOSAL | Status: DC
  Administered 2021-07-21: 15 mL via OROMUCOSAL
  Filled 2021-07-20 (×6): qty 15

## 2021-07-20 MED ORDER — DIAZEPAM 5 MG PO TABS
7.0000 mg | ORAL_TABLET | Freq: Every day | ORAL | Status: DC
  Administered 2021-07-20 – 2021-07-21 (×2): 7 mg via ORAL
  Filled 2021-07-20: qty 1
  Filled 2021-07-20: qty 4

## 2021-07-20 MED ORDER — THIAMINE HCL 100 MG/ML IJ SOLN
100.0000 mg | Freq: Every day | INTRAMUSCULAR | Status: DC
  Filled 2021-07-20: qty 2

## 2021-07-20 MED ORDER — ENOXAPARIN SODIUM 60 MG/0.6ML IJ SOSY
0.5000 mg/kg | PREFILLED_SYRINGE | INTRAMUSCULAR | Status: DC
Start: 2021-07-20 — End: 2021-07-21
  Administered 2021-07-20: 52.5 mg via SUBCUTANEOUS
  Filled 2021-07-20: qty 0.6

## 2021-07-20 MED ORDER — NALOXONE HCL 2 MG/2ML IJ SOSY
PREFILLED_SYRINGE | INTRAMUSCULAR | Status: AC
  Administered 2021-07-20: 2 mg
  Filled 2021-07-20: qty 2

## 2021-07-20 MED ORDER — ADULT MULTIVITAMIN W/MINERALS CH: 1.0000 | ORAL_TABLET | Freq: Every day | ORAL | Status: DC

## 2021-07-20 MED ORDER — LORAZEPAM 2 MG/ML IJ SOLN: 2.0000 mg | Freq: Once | INTRAMUSCULAR | Status: DC

## 2021-07-20 MED ORDER — NICOTINE 21 MG/24HR TD PT24
21.0000 mg | MEDICATED_PATCH | Freq: Every day | TRANSDERMAL | Status: DC
  Filled 2021-07-20 (×4): qty 1

## 2021-07-20 MED ORDER — CHLORHEXIDINE GLUCONATE 0.12% ORAL RINSE (MEDLINE KIT)
15.0000 mL | Freq: Two times a day (BID) | OROMUCOSAL | Status: DC
  Filled 2021-07-20 (×2): qty 15

## 2021-07-20 NOTE — Plan of Care (Signed)
  Problem: Education: Goal: Expressions of having a comfortable level of knowledge regarding the disease process will increase 07/20/2021 2101 by Maryan Puls, RN Outcome: Progressing 07/20/2021 2101 by Maryan Puls, RN Outcome: Progressing   Problem: Coping: Goal: Ability to adjust to condition or change in health will improve 07/20/2021 2101 by Maryan Puls, RN Outcome: Progressing 07/20/2021 2101 by Maryan Puls, RN Outcome: Progressing Goal: Ability to identify appropriate support needs will improve 07/20/2021 2101 by Maryan Puls, RN Outcome: Progressing 07/20/2021 2101 by Maryan Puls, RN Outcome: Progressing   Problem: Health Behavior/Discharge Planning: Goal: Compliance with prescribed medication regimen will improve 07/20/2021 2101 by Maryan Puls, RN Outcome: Progressing 07/20/2021 2101 by Maryan Puls, RN Outcome: Progressing   Problem: Medication: Goal: Risk for medication side effects will decrease Outcome: Progressing   Problem: Clinical Measurements: Goal: Complications related to the disease process, condition or treatment will be avoided or minimized 07/20/2021 2101 by Maryan Puls, RN Outcome: Progressing 07/20/2021 2101 by Maryan Puls, RN Outcome: Progressing

## 2021-07-20 NOTE — Assessment & Plan Note (Signed)
Alcohol use disorder EtOH level 281 Counseling on tobacco cessation when clinically improved

## 2021-07-20 NOTE — Assessment & Plan Note (Signed)
CT head and C-spine with no acute injury Continue to monitor for any symptoms that might indicate injury
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
## 2021-07-20 NOTE — ED Notes (Signed)
Pt attempting to communicate verbally at this time. Unable to move left leg or arm. Difficulty answering questions.

## 2021-07-20 NOTE — Assessment & Plan Note (Signed)
Patient with transient alteration of awareness

## 2021-07-20 NOTE — ED Notes (Signed)
Pt 02 sat noted to be dropping intermittently into the mid-upper 80s. 02 via [place] turned up to 5L without consistent improvement. Dr Alfred Levins made aware and respiratory called for subsequent orders

## 2021-07-20 NOTE — ED Notes (Signed)
Pt still presenting as having difficulty communicating. This nurse using quite a bit of inference to understand pt. Pt c/o abd pain and chest pain. This nurse asks pt if he needs something for pain. Pt shakes head yes and uses motion to gesture he wants to write. Paper and pen given to pt. Pt writes down " No narcotics." And states that " it was too hard to get off of them." Dr. Alfred Levins made aware of interaction

## 2021-07-20 NOTE — Progress Notes (Signed)
Eeg done 

## 2021-07-20 NOTE — H&P (Addendum)
History and Physical    Patient: Michael [name] DXI:[id number] DOB: 1991/01/15 DOA: 07/19/2021 DOS: the patient was seen and examined on 07/20/2021 PCP: [name], MD  Patient coming from: Home  Chief Complaint:  Chief Complaint  Patient presents with   Fall    HPI: Michael [name] is a 31 y.o. male with medical history significant for Benzodiazepine dependence with history of withdrawal seizures, alcohol abuse, depression and anxiety and asthma who presented to the ED following a fall while intoxicated hitting his head against a table.  He had no loss of consciousness.  He admits to drinking a 12 pack of beer prior to the fall.  While being triaged he had a staring episode and then subsequently started having difficulty speaking and articulating and then complained of numbness and tingling in both his arms.  He also complained of pain on the right side of his body.  Patient was recently admitted to [facility] for seizures in the setting of benzo withdrawals and was discharged on Valium which she states he has been taking since his discharge. ED course and data review: On arrival vitals within normal limits except for borderline low BP of 100/80.  Around the time of request for admission his oxygen saturation dropped to 87% and he was placed on 4 L nasal cannula and subsequently high flow nasal cannula at 6 L.  His blood work was significant for WBC of 11,200, ammonia level 38 and EtOH 281.  EKG, personally viewed and interpreted, showed sinus rhythm at 91 with no acute ST-T wave changes.  CT head and C-spine were nonacute and chest x-ray showed cardiomegaly with vascular congestion. Patient was given an LR bolus loaded with Keppra and hospitalist consulted for admission.  UDS pending at the time of admission.    Past Medical History:  Diagnosis Date   Allergy    Anal fissure 04/06/2017   Anxiety    Asthma    Chest pain off and on   COVID-19    02/2020   Depression    Eosinophilia     Family history of adverse reaction to anesthesia    adopted history unknown   GERD (gastroesophageal reflux disease)    Pancreatitis last 2015   Substance abuse ([place])    per pt heroin/cocaine age [age]    Past Surgical History:  Procedure Laterality Date   CHOLECYSTECTOMY  08/16/2015   Procedure: LAPAROSCOPIC CHOLECYSTECTOMY;  Surgeon: Alphonsa Overall, MD;  Location: [facility];  Service: General;;   ESOPHAGOGASTRODUODENOSCOPY (EGD) WITH PROPOFOL N/A 07/27/2016   Procedure: ESOPHAGOGASTRODUODENOSCOPY (EGD) WITH PROPOFOL;  Surgeon: Milus Banister, MD;  Location: [facility];  Service: Endoscopy;  Laterality: N/A;   ESOPHAGOGASTRODUODENOSCOPY (EGD) WITH PROPOFOL N/A 10/10/2017   Procedure: ESOPHAGOGASTRODUODENOSCOPY (EGD) WITH PROPOFOL;  Surgeon: Virgel Manifold, MD;  Location: [facility] ENDOS[facility];  Service: Endoscopy;  Laterality: N/A;   TONSILLECTOMY AND ADENOIDECTOMY  as child   and adenoids   XI ROBOTIC LAPAROSCOPIC ASSISTED APPENDECTOMY N/A 04/29/2021   Procedure: XI ROBOTIC LAPAROSCOPIC ASSISTED APPENDECTOMY;  Surgeon: Benjamine Sprague, DO;  Location: [facility] ORS;  Service: General;  Laterality: N/A;   Social History:  reports that he has been smoking cigarettes. He has a 22.50 pack-year smoking history. He quit smokeless tobacco use about 3 years ago.  His smokeless tobacco use included chew. He reports that he does not currently use alcohol after a past usage of about 24.0 standard drinks per week. He reports current drug use. Drug: Marijuana.  Allergies  Allergen  Reactions   Bee Venom Anaphylaxis   Quetiapine Rash   Serotonin Reuptake Inhibitors (Ssris) Rash   Amoxicillin Diarrhea    Has patient had a PCN reaction causing immediate rash, facial/tongue/throat swelling, SOB or lightheadedness with hypotension: No Has patient had a PCN reaction causing severe rash involving mucus membranes or skin necrosis: No Has patient had a PCN reaction that required hospitalization: No Has patient had a PCN  reaction occurring within the last 10 years: Yes If all of the above answers are "NO", then may proceed with Cephalosporin use.    Gabapentin Nausea And Vomiting    diarrhea   Prednisone Other (See Comments)    Difficulty breathing   Latuda [Lurasidone Hcl] Rash and Nausea And Vomiting    Family History  Adopted: Yes  Problem Relation Age of Onset   Diabetes Paternal Grandfather    Lung cancer Paternal Grandfather    Ulcers Father    Colon cancer Neg Hx     Prior to Admission medications   Medication Sig Start Date End Date Taking? Authorizing Provider  diazepam (VALIUM) 10 MG tablet Take 10 mg by mouth at bedtime.   Yes [provider]  ARIPiprazole (ABILIFY) 10 MG tablet Take 10 mg by mouth every morning. Patient not taking: Reported on 07/20/2021 11/23/20   [provider]  diazepam (VALIUM) 2 MG tablet Take 2 mg by mouth in the morning. (Take with '5mg'$  tablet to equal '7mg'$  in morning)    [provider]  diazepam (VALIUM) 5 MG tablet Take 5 mg by mouth in the morning. (Take with '2mg'$  tablet to equal '7mg'$  in morning)    [provider]  DULoxetine (CYMBALTA) 20 MG capsule Take 20 mg by mouth 2 (two) times daily. (Take with '60mg'$  capsules to equal '80mg'$  total)    [provider]  DULoxetine (CYMBALTA) 60 MG capsule Take 60 mg by mouth 2 (two) times daily. (Take with '20mg'$  capsules to equal '80mg'$  total)    [provider]  famotidine-calcium carbonate-magnesium hydroxide (PEPCID COMPLETE) 10-800-165 MG chewable tablet Chew 1 tablet by mouth daily as needed (heartburn or indigestion).    [provider]  nortriptyline (PAMELOR) 10 MG capsule Take 1 capsule (10 mg total) by mouth at bedtime. 07/04/21   Pieter Partridge, DO  omeprazole (PRILOSEC) 40 MG capsule TAKE 1 CAPSULE BY MOUTH ONCE DAILY 01/14/21   [name], MD  prazosin (MINIPRESS) 2 MG capsule Take 2 mg by mouth at bedtime. (Take with '5mg'$  capsule to equal '7mg'$  total)     [provider]  prazosin (MINIPRESS) 5 MG capsule Take 5 mg by mouth at bedtime. (Take with '2mg'$  capsule to equal '7mg'$  total)    [provider]  rizatriptan (MAXALT) 10 MG tablet Take 1 tablet (10 mg total) by mouth as needed for migraine (May repeat after 2 hours.  Maximum 2 tablets in 24 hours.). May repeat in 2 hours if needed 07/04/21   Pieter Partridge, DO    Physical Exam: Vitals:   07/20/21 0230 07/20/21 0256 07/20/21 0315 07/20/21 0330  BP: 117/71 117/71  123/90  Pulse: (!) 106 94 96 (!) 104  Resp:  16 (!) 24 (!) 24  Temp:      TempSrc:      SpO2: (!) 87% 92% (!) 89% 93%  Weight:      Height:       Physical Exam Vitals and nursing note reviewed.  Constitutional:      General:  He is sleeping. He is not in acute distress. HENT:     Head: Normocephalic and atraumatic.  Cardiovascular:     Rate and Rhythm: Normal rate and regular rhythm.     Heart sounds: Normal heart sounds.  Pulmonary:     Effort: Pulmonary effort is normal.     Breath sounds: Normal breath sounds.  Abdominal:     Palpations: Abdomen is soft.     Tenderness: There is no abdominal tenderness.  Neurological:     General: No focal deficit present.     Mental Status: He is easily aroused.     Cranial Nerves: No cranial nerve deficit.     Motor: No weakness.    Labs on Admission: I have personally reviewed following labs and imaging studies  CBC: Recent Labs  Lab 07/20/21 0015  WBC 11.2*  NEUTROABS 4.3  HGB 16.9  HCT 50.1  MCV 96.2  PLT 601   Basic Metabolic Panel: Recent Labs  Lab 07/20/21 0015  NA 139  K 4.0  CL 106  CO2 22  GLUCOSE 98  BUN 12  CREATININE 0.82  CALCIUM 8.8*   GFR: Estimated Creatinine Clearance: 151.7 mL/min (by C-G formula based on SCr of 0.82 mg/dL). Liver Function Tests: Recent Labs  Lab 07/20/21 0015  AST 28  ALT 55*  ALKPHOS 74  BILITOT 0.5  PROT 7.8  ALBUMIN 4.6   No results for input(s): LIPASE, AMYLASE in the last 168  hours. Recent Labs  Lab 07/20/21 0048  AMMONIA 38*   Coagulation Profile: No results for input(s): INR, PROTIME in the last 168 hours. Cardiac Enzymes: No results for input(s): CKTOTAL, CKMB, CKMBINDEX, TROPONINI in the last 168 hours. BNP (last 3 results) No results for input(s): PROBNP in the last 8760 hours. HbA1C: No results for input(s): HGBA1C in the last 72 hours. CBG: Recent Labs  Lab 07/20/21 0017  GLUCAP 111*   Lipid Profile: No results for input(s): CHOL, HDL, LDLCALC, TRIG, CHOLHDL, LDLDIRECT in the last 72 hours. Thyroid Function Tests: No results for input(s): TSH, T4TOTAL, FREET4, T3FREE, THYROIDAB in the last 72 hours. Anemia Panel: No results for input(s): VITAMINB12, FOLATE, FERRITIN, TIBC, IRON, RETICCTPCT in the last 72 hours. Urine analysis:    Component Value Date/Time   COLORURINE YELLOW 01/22/2021 1926   APPEARANCEUR Clear 01/26/2021 0956   LABSPEC 1.010 01/22/2021 1926   LABSPEC 1.012 08/15/2013 1756   PHURINE 6.0 01/22/2021 1926   GLUCOSEU Negative 01/26/2021 0956   GLUCOSEU Negative 08/15/2013 1756   HGBUR NEGATIVE 01/22/2021 1926   BILIRUBINUR Negative 01/26/2021 0956   BILIRUBINUR Negative 08/15/2013 1756   KETONESUR NEGATIVE 01/22/2021 1926   PROTEINUR Negative 01/26/2021 0956   PROTEINUR NEGATIVE 01/22/2021 1926   UROBILINOGEN 0.2 07/20/2016 1035   NITRITE Negative 01/26/2021 0956   NITRITE NEGATIVE 01/22/2021 1926   LEUKOCYTESUR Negative 01/26/2021 0956   LEUKOCYTESUR NEGATIVE 01/22/2021 1926   LEUKOCYTESUR Negative 08/15/2013 1756    Radiological Exams on Admission: CT Head Wo Contrast  Result Date: 07/20/2021 CLINICAL DATA:  Head trauma, abnormal mental status (Age 70-64y). Fall. EXAM: CT HEAD WITHOUT CONTRAST TECHNIQUE: Contiguous axial images were obtained from the base of the skull through the vertex without intravenous contrast. RADIATION DOSE REDUCTION: This exam was performed according to the departmental dose-optimization  program which includes automated exposure control, adjustment of the mA and/or kV according to patient size and/or use of iterative reconstruction technique. COMPARISON:  11/03/2020 FINDINGS: Brain: No acute intracranial abnormality. Specifically, no hemorrhage, hydrocephalus, mass  lesion, acute infarction, or significant intracranial injury. Vascular: No hyperdense vessel or unexpected calcification. Skull: No acute calvarial abnormality. Sinuses/Orbits: Mucosal thickening throughout the paranasal sinuses. Rounded soft tissue in the maxillary sinuses, likely mucous retention cysts. No air-fluid levels. Other: None IMPRESSION: No acute intracranial abnormality. Chronic sinusitis. Electronically Signed   By: Rolm Baptise M.D.   On: 07/20/2021 00:46   CT Cervical Spine Wo Contrast  Result Date: 07/20/2021 CLINICAL DATA:  Neck trauma, focal neuro deficit or paresthesia (Age 18-64y). Fall. EXAM: CT CERVICAL SPINE WITHOUT CONTRAST TECHNIQUE: Multidetector CT imaging of the cervical spine was performed without intravenous contrast. Multiplanar CT image reconstructions were also generated. RADIATION DOSE REDUCTION: This exam was performed according to the departmental dose-optimization program which includes automated exposure control, adjustment of the mA and/or kV according to patient size and/or use of iterative reconstruction technique. COMPARISON:  MRI 12/23/2020 FINDINGS: Alignment: Normal Skull base and vertebrae: No acute fracture. No primary bone lesion or focal pathologic process. Soft tissues and spinal canal: No prevertebral fluid or swelling. No visible canal hematoma. Disc levels:  Maintained Upper chest: Negative Other: None IMPRESSION: No acute bony abnormality. Electronically Signed   By: Rolm Baptise M.D.   On: 07/20/2021 00:48   DG Chest Portable 1 View  Result Date: 07/20/2021 CLINICAL DATA:  Hypoxia EXAM: PORTABLE CHEST 1 VIEW COMPARISON:  04/20/2021 FINDINGS: Cardiomegaly. Vascular congestion.  Low lung volumes with bibasilar atelectasis. No overt edema or acute bony abnormality. IMPRESSION: Cardiomegaly with vascular congestion. Low lung volumes with bibasilar atelectasis. Electronically Signed   By: Rolm Baptise M.D.   On: 07/20/2021 03:10     Data Reviewed: Relevant notes from primary care and specialist visits, past discharge summaries as available in EHR, including Care Everywhere. Prior diagnostic testing as pertinent to current admission diagnoses Updated medications and problem lists for reconciliation ED course, including vitals, labs, imaging, treatment and response to treatment Triage notes, nursing and pharmacy notes and ED provider's notes Notable results as noted in HPI   Assessment and Plan: * Transient alteration of awareness History of benzodiazepine withdrawal seizures Suspect seizure, in the setting of history of benzo withdrawal seizures,  Patient was loaded with Keppra in the ED Patient prepared to return to baseline mentation but then complained of altered speech and numbness in the extremities which she states happens after seizures We will continue Keppra Continue Valium Fall aspiration and seizure precautions Neurology consult  Fall at home, initial encounter CT head and C-spine with no acute injury Continue to monitor for any symptoms that might indicate injury  Alcohol intoxication ([place]) Alcohol use disorder EtOH level 281 Counseling on tobacco cessation when clinically improved  Benzodiazepine dependence ([place]) Continue Valium with Ativan as needed  Increased ammonia level Ammonia level 38, acute encephalopathy from ammonia level not suspected at this time Close monitoring of mental status We will keep n.p.o. tonight Aspiration precautions  Acute respiratory failure with hypoxia ([place]) Patient became hypoxic while in the ED requiring high flow nasal cannula Continue supplemental oxygen and wean as tolerated Could have underlying  OSA    DVT prophylaxis: Lovenox  Consults: neurology  Advance Care Planning:   Code Status: Prior full  Family Communication: none  Disposition Plan: Back to previous home environment  Severity of Illness: The appropriate patient status for this patient is INPATIENT. Inpatient status is judged to be reasonable and necessary in order to provide the required intensity of service to ensure the patient's safety. The patient's presenting symptoms, physical exam  findings, and initial radiographic and laboratory data in the context of their chronic comorbidities is felt to place them at high risk for further clinical deterioration. Furthermore, it is not anticipated that the patient will be medically stable for discharge from the hospital within 2 midnights of admission.   * I certify that at the point of admission it is my clinical judgment that the patient will require inpatient hospital care spanning beyond 2 midnights from the point of admission due to high intensity of service, high risk for further deterioration and high frequency of surveillance required.*  Author: Athena Masse, MD 07/20/2021 4:47 AM  For on call review [email or web address].

## 2021-07-20 NOTE — ED Provider Notes (Signed)
[facility] Provider Note    Event Date/Time   First MD Initiated Contact with Patient 07/20/21 0018     (approximate)   History   Fall   HPI  Michael [name] is a 31 y.o. male history of benzodiazepine dependence, alcohol abuse, depression, anxiety, asthma who presents after a fall.  Positive EtOH today.  Lost his balance while ambulating drunk and fell hitting his head on a table.  Reports drinking at least a 12 pack of beer tonight.  While patient is being triage he suddenly stopped talking and started staring.  After several minutes patient started tracking and answering to questions by shaking his head. He started gesticulating asking for pen and paper to answer to questions. Then started staring again for 30s.  Patient was able to talk again.  According to him he has had seizures in the past where he has been unable to talk afterwards.  He was recently admitted to [facility] for seizures in the setting of benzo withdrawals.  He reports that he has been taking his Valium since being discharged from [facility].     Past Medical History:  Diagnosis Date   Allergy    Anal fissure 04/06/2017   Anxiety    Asthma    Chest pain off and on   COVID-19    02/2020   Depression    Eosinophilia    Family history of adverse reaction to anesthesia    adopted history unknown   GERD (gastroesophageal reflux disease)    Pancreatitis last 2015   Substance abuse ([place])    per pt heroin/cocaine age [age]     Past Surgical History:  Procedure Laterality Date   CHOLECYSTECTOMY  08/16/2015   Procedure: LAPAROSCOPIC CHOLECYSTECTOMY;  Surgeon: Alphonsa Overall, MD;  Location: [facility] ORS;  Service: General;;   ESOPHAGOGASTRODUODENOSCOPY (EGD) WITH PROPOFOL N/A 07/27/2016   Procedure: ESOPHAGOGASTRODUODENOSCOPY (EGD) WITH PROPOFOL;  Surgeon: Milus Banister, MD;  Location: [facility] ENDOSCOPY;  Service: Endoscopy;  Laterality: N/A;   ESOPHAGOGASTRODUODENOSCOPY (EGD) WITH PROPOFOL N/A 10/10/2017    Procedure: ESOPHAGOGASTRODUODENOSCOPY (EGD) WITH PROPOFOL;  Surgeon: Virgel Manifold, MD;  Location: [facility] ENDOSCOPY;  Service: Endoscopy;  Laterality: N/A;   TONSILLECTOMY AND ADENOIDECTOMY  as child   and adenoids   XI ROBOTIC LAPAROSCOPIC ASSISTED APPENDECTOMY N/A 04/29/2021   Procedure: XI ROBOTIC LAPAROSCOPIC ASSISTED APPENDECTOMY;  Surgeon: Benjamine Sprague, DO;  Location: [facility] ORS;  Service: General;  Laterality: N/A;     Physical Exam   Triage Vital Signs: ED Triage Vitals  Enc Vitals Group     BP 07/20/21 0027 100/80     Pulse Rate 07/20/21 0027 99     Resp 07/20/21 0027 18     Temp 07/20/21 0027 98.3 F (36.8 C)     Temp Source 07/20/21 0027 Axillary     SpO2 07/20/21 0027 93 %     Weight 07/20/21 0004 230 lb (104.3 kg)     Height 07/20/21 0004 '5\' 7"'$  (1.702 m)     Head Circumference --      Peak Flow --      Pain Score 07/20/21 0004 10     Pain Loc --      Pain Edu? --      Excl. in [place]? --     Most recent vital signs: Vitals:   07/20/21 0200 07/20/21 0256  BP: 124/74 117/71  Pulse: 93 94  Resp:  16  Temp:    SpO2: 90% 92%  Constitutional: Alert and oriented. apparent distress. HEENT:      Head: Normocephalic and atraumatic.         Eyes: Conjunctivae are normal. Sclera is non-icteric.       Mouth/Throat: Mucous membranes are moist.       Neck: Supple with no signs of meningismus. Cardiovascular: Regular rate and rhythm. No murmurs, gallops, or rubs. 2+ symmetrical distal pulses are present in all extremities.  Respiratory: Normal respiratory effort. Lungs are clear to auscultation bilaterally.  Gastrointestinal: Soft, non tender, and non distended with positive bowel sounds. No rebound or guarding. Musculoskeletal:  No edema, cyanosis, or erythema of extremities. Neurologic: Aphasic. Face is symmetric. Moving all extremities. No gross focal neurologic deficits are appreciated. Skin: Skin is warm, dry and intact. No rash noted. Psychiatric: Mood and  affect are normal. Speech and behavior are normal.  ED Results / Procedures / Treatments   Labs (all labs ordered are listed, but only abnormal results are displayed) Labs Reviewed  ETHANOL - Abnormal; Notable for the following components:      Result Value   Alcohol, Ethyl (B) 281 (*)    All other components within normal limits  CBC WITH DIFFERENTIAL/PLATELET - Abnormal; Notable for the following components:   WBC 11.2 (*)    Lymphs Abs 4.5 (*)    Eosinophils Absolute 1.0 (*)    Basophils Absolute 0.2 (*)    Abs Immature Granulocytes 0.23 (*)    All other components within normal limits  COMPREHENSIVE METABOLIC PANEL - Abnormal; Notable for the following components:   Calcium 8.8 (*)    ALT 55 (*)    All other components within normal limits  AMMONIA - Abnormal; Notable for the following components:   Ammonia 38 (*)    All other components within normal limits  CBG MONITORING, ED - Abnormal; Notable for the following components:   Glucose-Capillary 111 (*)    All other components within normal limits  URINE DRUG SCREEN, QUALITATIVE ([facility] ONLY)     EKG  ED ECG REPORT I, Rudene Re, the attending physician, personally viewed and interpreted this ECG.  Sinus rhythm with a rate of 91, normal intervals, normal axis, no ST elevations or depressions.  RADIOLOGY I, Rudene Re, attending MD, have personally viewed and interpreted the images obtained during this visit as below:  CT head and C-spine negative for traumatic injury   ___________________________________________________ Interpretation by Radiologist:  CT Head Wo Contrast  Result Date: 07/20/2021 CLINICAL DATA:  Head trauma, abnormal mental status (Age 40-64y). Fall. EXAM: CT HEAD WITHOUT CONTRAST TECHNIQUE: Contiguous axial images were obtained from the base of the skull through the vertex without intravenous contrast. RADIATION DOSE REDUCTION: This exam was performed according to the departmental  dose-optimization program which includes automated exposure control, adjustment of the mA and/or kV according to patient size and/or use of iterative reconstruction technique. COMPARISON:  11/03/2020 FINDINGS: Brain: No acute intracranial abnormality. Specifically, no hemorrhage, hydrocephalus, mass lesion, acute infarction, or significant intracranial injury. Vascular: No hyperdense vessel or unexpected calcification. Skull: No acute calvarial abnormality. Sinuses/Orbits: Mucosal thickening throughout the paranasal sinuses. Rounded soft tissue in the maxillary sinuses, likely mucous retention cysts. No air-fluid levels. Other: None IMPRESSION: No acute intracranial abnormality. Chronic sinusitis. Electronically Signed   By: Rolm Baptise M.D.   On: 07/20/2021 00:46   CT Cervical Spine Wo Contrast  Result Date: 07/20/2021 CLINICAL DATA:  Neck trauma, focal neuro deficit or paresthesia (Age 46-64y). Fall. EXAM: CT CERVICAL SPINE WITHOUT CONTRAST  TECHNIQUE: Multidetector CT imaging of the cervical spine was performed without intravenous contrast. Multiplanar CT image reconstructions were also generated. RADIATION DOSE REDUCTION: This exam was performed according to the departmental dose-optimization program which includes automated exposure control, adjustment of the mA and/or kV according to patient size and/or use of iterative reconstruction technique. COMPARISON:  MRI 12/23/2020 FINDINGS: Alignment: Normal Skull base and vertebrae: No acute fracture. No primary bone lesion or focal pathologic process. Soft tissues and spinal canal: No prevertebral fluid or swelling. No visible canal hematoma. Disc levels:  Maintained Upper chest: Negative Other: None IMPRESSION: No acute bony abnormality. Electronically Signed   By: Rolm Baptise M.D.   On: 07/20/2021 00:48   DG Chest Portable 1 View  Result Date: 07/20/2021 CLINICAL DATA:  Hypoxia EXAM: PORTABLE CHEST 1 VIEW COMPARISON:  04/20/2021 FINDINGS: Cardiomegaly.  Vascular congestion. Low lung volumes with bibasilar atelectasis. No overt edema or acute bony abnormality. IMPRESSION: Cardiomegaly with vascular congestion. Low lung volumes with bibasilar atelectasis. Electronically Signed   By: Rolm Baptise M.D.   On: 07/20/2021 03:10       PROCEDURES:  Critical Care performed: No  Procedures    IMPRESSION / MDM / ASSESSMENT AND PLAN / ED COURSE  I reviewed the triage vital signs and the nursing notes.  31 y.o. male history of benzodiazepine dependence, alcohol abuse, depression, anxiety, asthma who presents after a fall. + EtOH. Patient had two staring unresponsive episodes concerning for possible seizure.  Became aphasic afterward.  Reports having had similar symptoms in the past after a seizure.  Now he is talking, moving all extremities.  Looks clearly clinically intoxicated. Labs showing ethanol level 281, no significant electrolyte derangements otherwise.  UDS is pending.  CT head and C-spine show no signs of traumatic injury.  Patient was loaded with Keppra and remained stable with no further episodes of seizure.  Since he has had 2 back-to-back seizures I will consult the hospitalist for admission.  MEDICATIONS GIVEN IN ED: Medications  naloxone (NARCAN) 2 MG/2ML injection (2 mg  Given 07/20/21 0044)  lactated ringers bolus 1,000 mL (1,000 mLs Intravenous Bolus 07/20/21 0042)  levETIRAcetam (KEPPRA) IVPB 1500 mg/ 100 mL premix (0 mg Intravenous Stopped 07/20/21 9480)    Consults: Hospitalist   EMR reviewed including records from [facility] from his admission from 11 days ago for seizures in the setting of benzo withdrawal    FINAL CLINICAL IMPRESSION(S) / ED DIAGNOSES   Final diagnoses:  Fall, initial encounter  Alcoholic intoxication with complication ([place])  Seizure ([place])     Rx / DC Orders   ED Discharge Orders     None        Note:  This document was prepared using Dragon voice recognition software and may include unintentional  dictation errors.   Please note:  Patient was evaluated in Emergency Department today for the symptoms described in the history of present illness. Patient was evaluated in the context of the global COVID-19 pandemic, which necessitated consideration that the patient might be at risk for infection with the SARS-CoV-2 virus that causes COVID-19. Institutional protocols and algorithms that pertain to the evaluation of patients at risk for COVID-19 are in a state of rapid change based on information released by regulatory bodies including the CDC and federal and state organizations. These policies and algorithms were followed during the patient's care in the ED.  Some ED evaluations and interventions may be delayed as a result of limited staffing during the pandemic.  Alfred Levins, [place], MD 07/20/21 [phone]

## 2021-07-20 NOTE — Progress Notes (Signed)
PHARMACIST - PHYSICIAN COMMUNICATION  CONCERNING:  Enoxaparin (Lovenox) for DVT Prophylaxis    RECOMMENDATION: Patient was prescribed enoxaprin '40mg'$  q24 hours for VTE prophylaxis.   Filed Weights   07/20/21 0004  Weight: 104.3 kg (230 lb)    Body mass index is 36.02 kg/m.  Estimated Creatinine Clearance: 151.7 mL/min (by C-G formula based on SCr of 0.82 mg/dL).   Based on [place] patient is candidate for enoxaparin 0.'5mg'$ /kg TBW SQ every 24 hours based on BMI being >30.  DESCRIPTION: Pharmacy has adjusted enoxaparin dose per [facility] policy.  Patient is now receiving enoxaparin 0.5 mg/kg every 24 hours   Renda Rolls, PharmD, [facility] 07/20/2021 5:45 AM

## 2021-07-20 NOTE — Assessment & Plan Note (Signed)
Continue Valium with Ativan as needed

## 2021-07-20 NOTE — Assessment & Plan Note (Signed)
Patient became hypoxic while in the ED requiring high flow nasal cannula Continue supplemental oxygen and wean as tolerated Could have underlying OSA

## 2021-07-20 NOTE — ED Triage Notes (Signed)
Pt c/o fall into table, hitting right side of head. Reports Right leg pain, right shoulder pain and crushing chest pain. Pt nauseous. Admits to drinking 12 pack of beer tonight, and being a heavy daily drinker, apx 1 liter/daily. Pt speech slurred, intermittently alert. While triaging pt, he suddenly stopped talking, began staring into distance without focus and became unresponsive. This nurse and Joellen Jersey, RN attempt stimuli verbally ane with sternal rub without response. Bvm used to assist respirations. Dr Alfred Levins at bedside. After apx 2 mins pt became more responsive, asking to write as he is unable to talk.

## 2021-07-20 NOTE — Progress Notes (Signed)
  Progress Note   Patient: Michael [name] DOB: Apr 18, 1990 DOA: 07/19/2021     0 DOS: the patient was seen and examined on 07/20/2021   Brief hospital course: [name] Emry is a 31 y.o. male with medical history significant for Benzodiazepine dependence with history of withdrawal seizures, alcohol abuse, depression and anxiety and asthma who presented to the ED following a fall while intoxicated hitting his head against a table. While in the emergency room, patient had another episode of unresponsiveness following a prolonged cough.  At this time, patient developed significant hypoxemia, with oxygen dropped to below 80%.  He was placed on a breather.  Condition was better after about an hour.  ABG did not show any severe hypoxemia or hypercapnia.   Assessment and Plan: Altered mental status. Fall with possible head concussion. Cough syncope versus seizure. Acute hypoxemic respiratory failure. ABG did not show significant CO2 retention.  Chest x-ray which I personally reviewed showed some vascular congestion.  No evidence of pneumonia. Patient altered mental status followed persistent cough.  Potentially he had a cough syncope, he also has a history of seizure in the past, will obtain EEG to rule out a seizure. Patient does not seem to have evidence of pneumonia.  We will obtain echocardiogram to evaluate left ventricular systolic function. Obtain neurology consult. Acute hypoxemia following this episode could be due to obstructive sleep apnea or volume overload.  Check a BNP and echocardiogram.  Alcohol use disorder with alcohol intoxication. Benzodiazepine dependence. Valium was continued. Thiamine and folic acid.  Minimal elevation in ammonia level. Does not seem to be causing altered mental status.     Subjective:  Patient had episode of cough, followed by unresponsiveness.  Then followed by significant hypoxemia. Condition has improved after about an  hour.  Physical Exam: Vitals:   07/20/21 1000 07/20/21 1030 07/20/21 1150 07/20/21 1232  BP: (!) 130/100 (!) 121/96 123/84 (!) 148/87  Pulse: (!) 104 (!) 106 (!) 105 (!) 106  Resp:    (!) 22  Temp:    98.6 F (37 C)  TempSrc:    Oral  SpO2: 92% 92% 92% 96%  Weight:      Height:       General exam: Appears calm and comfortable  Respiratory system: Clear to auscultation. Respiratory effort normal. Cardiovascular system: S1 & S2 heard, RRR. No JVD, murmurs, rubs, gallops or clicks. No pedal edema. Gastrointestinal system: Abdomen is nondistended, soft and nontender. No organomegaly or masses felt. Normal bowel sounds heard. Central nervous system: Alert and oriented. No focal neurological deficits. Extremities: Symmetric 5 x 5 power. Skin: No rashes, lesions or ulcers Psychiatry: Judgement and insight appear normal. Mood & affect appropriate.   Data Reviewed:  CT head reviewed, all lab results reviewed.  Family Communication:   Disposition: Status is: Observation The patient remains OBS appropriate and will d/c before 2 midnights.  Planned Discharge Destination: Home    Time spent: no charge minutes  Author: Sharen Hones, MD 07/20/2021 1:03 PM  For on call review [email or web address].

## 2021-07-20 NOTE — ED Notes (Addendum)
Sats have remained 97 % on NRB, pt placed back on 6 liters N/c

## 2021-07-20 NOTE — Assessment & Plan Note (Signed)
Ammonia level 38, acute encephalopathy from ammonia level not suspected at this time Close monitoring of mental status We will keep n.p.o. tonight Aspiration precautions

## 2021-07-20 NOTE — Assessment & Plan Note (Addendum)
History of benzodiazepine withdrawal seizures Suspect seizure, in the setting of history of benzo withdrawal seizures,  Patient was loaded with Keppra in the ED Patient prepared to return to baseline mentation but then complained of altered speech and numbness in the extremities which she states happens after seizures We will continue Keppra Continue Valium Fall aspiration and seizure precautions Neurology consult

## 2021-07-20 NOTE — ED Notes (Signed)
O2 Sats down to 79 % on 6 liters , pt placed on NRB sats up to 93 % ,

## 2021-07-21 ENCOUNTER — Observation Stay: Payer: Medicare Other

## 2021-07-21 ENCOUNTER — Observation Stay (HOSPITAL_BASED_OUTPATIENT_CLINIC_OR_DEPARTMENT_OTHER)
Admit: 2021-07-21 | Discharge: 2021-07-21 | Disposition: A | Discharge status: Home / Self Care | Payer: Medicare Other | Attending: Internal Medicine | Admitting: Internal Medicine

## 2021-07-21 DIAGNOSIS — Z9049 Acquired absence of other specified parts of digestive tract: Secondary | ICD-10-CM | POA: Diagnosis not present

## 2021-07-21 DIAGNOSIS — R404 Transient alteration of awareness: Secondary | ICD-10-CM | POA: Diagnosis not present

## 2021-07-21 DIAGNOSIS — J690 Pneumonitis due to inhalation of food and vomit: Secondary | ICD-10-CM | POA: Diagnosis not present

## 2021-07-21 DIAGNOSIS — R569 Unspecified convulsions: Secondary | ICD-10-CM | POA: Diagnosis not present

## 2021-07-21 DIAGNOSIS — G935 Compression of brain: Secondary | ICD-10-CM | POA: Diagnosis not present

## 2021-07-21 DIAGNOSIS — I420 Dilated cardiomyopathy: Secondary | ICD-10-CM | POA: Diagnosis not present

## 2021-07-21 DIAGNOSIS — J3489 Other specified disorders of nose and nasal sinuses: Secondary | ICD-10-CM | POA: Diagnosis not present

## 2021-07-21 DIAGNOSIS — J9811 Atelectasis: Secondary | ICD-10-CM | POA: Diagnosis not present

## 2021-07-21 DIAGNOSIS — G8384 Todd's paralysis (postepileptic): Secondary | ICD-10-CM

## 2021-07-21 DIAGNOSIS — J341 Cyst and mucocele of nose and nasal sinus: Secondary | ICD-10-CM | POA: Diagnosis not present

## 2021-07-21 DIAGNOSIS — J9601 Acute respiratory failure with hypoxia: Secondary | ICD-10-CM | POA: Diagnosis not present

## 2021-07-21 LAB — COMPREHENSIVE METABOLIC PANEL
ALT: 48 U/L — ABNORMAL HIGH (ref 0–44)
AST: 31 U/L (ref 15–41)
Albumin: 3.9 g/dL (ref 3.5–5.0)
Alkaline Phosphatase: 68 U/L (ref 38–126)
Anion gap: 7 (ref 5–15)
BUN: 14 mg/dL (ref 6–20)
CO2: 26 mmol/L (ref 22–32)
Calcium: 9.1 mg/dL (ref 8.9–10.3)
Chloride: 105 mmol/L (ref 98–111)
Creatinine, Ser: 0.82 mg/dL (ref 0.61–1.24)
GFR, Estimated: >60 mL/min (ref >60)
Glucose, Bld: 101 mg/dL — ABNORMAL HIGH (ref 70–99)
Potassium: 4 mmol/L (ref 3.5–5.1)
Sodium: 138 mmol/L (ref 135–145)
Total Bilirubin: 1 mg/dL (ref 0.3–1.2)
Total Protein: 7.1 g/dL (ref 6.5–8.1)

## 2021-07-21 LAB — ECHOCARDIOGRAM COMPLETE
Area-P 1/2: 3.95 cm2
Height: 67 in
S' Lateral: 2.9 cm
Weight: 3680 oz

## 2021-07-21 LAB — PHOSPHORUS: Phosphorus: 3.4 mg/dL (ref 2.5–4.6)

## 2021-07-21 LAB — MAGNESIUM: Magnesium: 2.2 mg/dL (ref 1.7–2.4)

## 2021-07-21 MED ORDER — GADOBUTROL 1 MMOL/ML IV SOLN
10.0000 mL | Freq: Once | INTRAVENOUS | Status: AC | PRN
  Administered 2021-07-21: 10 mL via INTRAVENOUS

## 2021-07-21 MED ORDER — LORAZEPAM 2 MG/ML IJ SOLN
2.0000 mg | Freq: Once | INTRAMUSCULAR | Status: AC | PRN
  Administered 2021-07-21: 2 mg via INTRAVENOUS

## 2021-07-21 MED ORDER — LEVETIRACETAM 500 MG PO TABS
500.0000 mg | ORAL_TABLET | Freq: Two times a day (BID) | ORAL | 0 refills | Status: DC
Start: 2021-07-21 — End: 2021-08-09

## 2021-07-21 MED ORDER — AMOXICILLIN-POT CLAVULANATE 875-125 MG PO TABS: 1.0000 | ORAL_TABLET | Freq: Two times a day (BID) | ORAL | 0 refills | Status: AC

## 2021-07-21 MED ORDER — IOHEXOL 350 MG/ML SOLN
75.0000 mL | Freq: Once | INTRAVENOUS | Status: AC | PRN
  Administered 2021-07-21: 75 mL via INTRAVENOUS

## 2021-07-21 MED ORDER — AMOXICILLIN-POT CLAVULANATE 875-125 MG PO TABS
1.0000 | ORAL_TABLET | Freq: Two times a day (BID) | ORAL | Status: DC
  Administered 2021-07-21: 1 via ORAL
  Filled 2021-07-21: qty 1
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
## 2021-07-21 NOTE — TOC Transition Note (Signed)
Transition of Care [facility]) - CM/SW Discharge Note   Patient Details  Name: Michael [name] MRN: [id number] Date of Birth: 1990/05/05  [facility] CM/SW Contact:  Candie Chroman, LCSW Phone Number: 07/21/2021, 1:49 PM   Clinical Narrative:  Patient has orders to discharge home today. CSW met with patient. No supports at bedside. CSW introduced role and inquired about interest in SA resources. Patient initially declined but eventually agreed. CSW provided. His mother will take him home today. He has been weaned to room air. No further concerns. CSW signing off.   Final next level of care: Home/Self Care Barriers to Discharge: No Barriers Identified   Patient Goals and CMS Choice        Discharge Placement                Patient to be transferred to facility by: Mother   Patient and family notified of of transfer: 07/21/21  Discharge Plan and Services                                     Social Determinants of Health (SDOH) Interventions     Readmission Risk Interventions     View : No data to display.
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
## 2021-07-21 NOTE — Care Management Obs Status (Signed)
[facility] NOTIFICATION   Patient Details  Name: Michael [name] MRN: [id number] Date of Birth: [birth year]   Medicare Observation Status Notification Given:  Yes    Candie Chroman, LCSW 07/21/2021, 1:49 PM

## 2021-07-21 NOTE — Plan of Care (Signed)
MRI brain reveals a Chiari 1 malformation with the cerebellar tonsils extending up to 9 mm below the foramen magnum, in addition to possibly clinically significant crowding at the level of the foramen. Otherwise normal brain MRI, with no acute stroke or cortical lesion seen to explain his recurrent seizures.   EEG: Within normal limits. Excessive beta activity seen in the background is most likely due to the effect of benzodiazepine and is a benign EEG pattern. No seizures or epileptiform discharges were seen throughout the recording.  Assessment/Recommendations: - Will need outpatient Neurosurgery follow up for the Chiari 1 malformation.  - Continue Keppra as outpatient.  - Outpatient Neurology follow up.   Electronically signed: Dr. Kerney Elbe

## 2021-07-21 NOTE — Progress Notes (Signed)
  Echocardiogram 2D Echocardiogram has been performed.  Michael [name] M 07/21/2021, 8:55 AM

## 2021-07-22 DIAGNOSIS — F4310 Post-traumatic stress disorder, unspecified: Secondary | ICD-10-CM | POA: Diagnosis not present

## 2021-07-22 DIAGNOSIS — F411 Generalized anxiety disorder: Secondary | ICD-10-CM | POA: Diagnosis not present

## 2021-07-22 DIAGNOSIS — R4587 Impulsiveness: Secondary | ICD-10-CM | POA: Diagnosis not present

## 2021-08-01 ENCOUNTER — Ambulatory Visit (INDEPENDENT_AMBULATORY_CARE_PROVIDER_SITE_OTHER): Payer: Medicare Other

## 2021-08-01 VITALS — Ht 67.0 in | Wt 230.0 lb

## 2021-08-01 DIAGNOSIS — Z0000 Encounter for general adult medical examination without abnormal findings: Secondary | ICD-10-CM

## 2021-08-01 NOTE — Progress Notes (Signed)
Subjective:   Michael [name] is a 31 y.o. male who presents for Medicare Annual/Subsequent preventive examination.  Review of Systems    No ROS.  Medicare Wellness Virtual Visit.  Visual/audio telehealth visit, UTA vital signs.   See social history for additional risk factors.   Cardiac Risk Factors include: advanced age (>70mn, >>64women);male gender     Objective:    Today's Vitals   08/01/21 1142  Weight: 230 lb (104.3 kg)  Height: '5\' 7"'$  (1.702 m)   Body mass index is 36.02 kg/m.     08/01/2021   11:47 AM 07/20/2021    5:37 PM 07/20/2021    8:00 AM 07/20/2021   12:05 AM 05/24/2021    3:43 PM 04/29/2021   10:00 PM 01/22/2021    7:27 PM  Advanced Directives  Does Patient Have a Medical Advance Directive? No No No No No No No  Would patient like information on creating a medical advance directive? No - Patient declined No - Patient declined No - Patient declined No - Patient declined No - Patient declined No - Patient declined     Current Medications (verified) Outpatient Encounter Medications as of 08/01/2021  Medication Sig   ARIPiprazole (ABILIFY) 10 MG tablet Take 10 mg by mouth every morning.   diazepam (VALIUM) 10 MG tablet Take 10 mg by mouth at bedtime.   diazepam (VALIUM) 2 MG tablet Take 2 mg by mouth in the morning. (Take with '5mg'$  tablet to equal '7mg'$  in morning)   diazepam (VALIUM) 5 MG tablet Take 5 mg by mouth in the morning. (Take with '2mg'$  tablet to equal '7mg'$  in morning)   DULoxetine (CYMBALTA) 20 MG capsule Take 20 mg by mouth 2 (two) times daily. (Take with '60mg'$  capsules to equal '80mg'$  total)   DULoxetine (CYMBALTA) 60 MG capsule Take 60 mg by mouth 2 (two) times daily. (Take with '20mg'$  capsules to equal '80mg'$  total)   famotidine-calcium carbonate-magnesium hydroxide (PEPCID COMPLETE) 10-800-165 MG chewable tablet Chew 1 tablet by mouth daily as needed (heartburn or indigestion).   levETIRAcetam (KEPPRA) 500 MG tablet Take 1 tablet (500 mg total) by mouth 2  (two) times daily.   nortriptyline (PAMELOR) 10 MG capsule Take 1 capsule (10 mg total) by mouth at bedtime.   omeprazole (PRILOSEC) 40 MG capsule TAKE 1 CAPSULE BY MOUTH ONCE DAILY   prazosin (MINIPRESS) 2 MG capsule Take 2 mg by mouth at bedtime. (Take with '5mg'$  capsule to equal '7mg'$  total)   prazosin (MINIPRESS) 5 MG capsule Take 5 mg by mouth at bedtime. (Take with '2mg'$  capsule to equal '7mg'$  total)   rizatriptan (MAXALT) 10 MG tablet Take 1 tablet (10 mg total) by mouth as needed for migraine (May repeat after 2 hours.  Maximum 2 tablets in 24 hours.). May repeat in 2 hours if needed   No facility-administered encounter medications on file as of 08/01/2021.    Allergies (verified) Bee venom, Quetiapine, Serotonin reuptake inhibitors (ssris), Amoxicillin, Gabapentin, Prednisone, and Latuda [lurasidone hcl]   History: Past Medical History:  Diagnosis Date   Allergy    Anal fissure 04/06/2017   Anxiety    Asthma    Chest pain off and on   COVID-19    02/2020   Depression    Eosinophilia    Family history of adverse reaction to anesthesia    adopted history unknown   GERD (gastroesophageal reflux disease)    Pancreatitis last 2015   [place]    per  pt heroin/cocaine age [age]    Past Surgical History:  Procedure Laterality Date   CHOLECYSTECTOMY  08/16/2015   Procedure: LAPAROSCOPIC CHOLECYSTECTOMY;  Surgeon: Alphonsa Overall, MD;  Location: [facility];  Service: General;;   ESOPHAGOGASTRODUODENOSCOPY (EGD) WITH PROPOFOL N/A 07/27/2016   Procedure: ESOPHAGOGASTRODUODENOSCOPY (EGD) WITH PROPOFOL;  Surgeon: Milus Banister, MD;  Location: [facility];  Service: Endoscopy;  Laterality: N/A;   ESOPHAGOGASTRODUODENOSCOPY (EGD) WITH PROPOFOL N/A 10/10/2017   Procedure: ESOPHAGOGASTRODUODENOSCOPY (EGD) WITH PROPOFOL;  Surgeon: Virgel Manifold, MD;  Location: [facility] ENDOSCOPY;  Service: Endoscopy;  Laterality: N/A;   TONSILLECTOMY AND ADENOIDECTOMY  as child   and adenoids   XI  ROBOTIC LAPAROSCOPIC ASSISTED APPENDECTOMY N/A 04/29/2021   Procedure: XI ROBOTIC LAPAROSCOPIC ASSISTED APPENDECTOMY;  Surgeon: Benjamine Sprague, DO;  Location: [facility] ORS;  Service: General;  Laterality: N/A;   Family History  Adopted: Yes  Problem Relation Age of Onset   Diabetes Paternal Grandfather    Lung cancer Paternal Grandfather    Ulcers Father    Colon cancer Neg Hx    Social History   Socioeconomic History   Marital status: Single    Spouse name: Not on file   Number of children: 2   Years of education: 12   Highest education level: 12th grade  Occupational History    Comment: Engineer, building services   Occupation: pressure washer  Tobacco Use   Smoking status: Every Day    Packs/day: 1.50    Years: 15.00    Total pack years: 22.50    Types: Cigarettes   Smokeless tobacco: Former    Types: Chew    Quit date: 08/12/2017   Tobacco comments:    1 pack per day--09/08/2019  Vaping Use   Vaping Use: Never used  Substance and Sexual Activity   Alcohol use: Yes    Alcohol/week: 24.0 standard drinks of alcohol    Types: 24 Cans of beer per week    Comment: 24 per day   Drug use: Yes    Frequency: 14.0 times per week    Types: Marijuana    Comment: heroin, cocaine, acid, pain killers; last use yrs ago, marijuana daily use 08/13/18    Sexual activity: Not Currently    Partners: Female  Other Topics Concern   Not on file  Social History Narrative   Lives at home with parents.      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, jewellery - Completed high school      Hobbies - computers, outdoors, fishing      Diet - regular      Exercise - none   Caffeine- coffee, 2 pots daily       H/o cocaine/heroin abuse 18 -82 y.o    smoker   Social Determinants of Health   Financial Resource Strain: Low Risk  (08/01/2021)   Overall Financial Resource Strain (CARDIA)    Difficulty of Paying Living Expenses: Not  very hard  Food Insecurity: No Food Insecurity (08/01/2021)   Hunger Vital Sign    Worried About Running Out of Food in the Last Year: Never true    Ran Out of Food in the Last Year: Never true  Transportation Needs: No Transportation Needs (08/01/2021)   PRAPARE - Hydrologist (Medical): No    Lack of Transportation (Non-Medical): No  Physical Activity: Insufficiently Active (  08/01/2021)   Exercise Vital Sign    Days of Exercise per Week: 2 days    Minutes of Exercise per Session: 10 min  Stress: Stress Concern Present (08/01/2021)   [place]    Feeling of Stress : Rather much  Social Connections: Socially Isolated (08/01/2021)   Social Connection and Isolation Panel [NHANES]    Frequency of Communication with Friends and Family: Once a week    Frequency of Social Gatherings with Friends and Family: Once a week    Attends Religious Services: Never    Marine scientist or Organizations: No    Attends Music therapist: Not on file    Marital Status: Never married    Tobacco Counseling Ready to quit: Not Answered Counseling given: Not Answered Tobacco comments: 1 pack per day--09/08/2019   Clinical Intake:  Pre-visit preparation completed: Yes        Diabetes: No  How often do you need to have someone help you when you read instructions, pamphlets, or other written materials from your doctor or pharmacy?: 1 - Never   Interpreter Needed?: No    Activities of Daily Living    08/01/2021   11:49 AM 07/20/2021    1:17 PM  In your present state of health, do you have any difficulty performing the following activities:  Hearing? 0   Vision? 0   Difficulty concentrating or making decisions? 0   Walking or climbing stairs? 0   Dressing or bathing? 0   Doing errands, shopping? 1 0  Comment Family Diplomatic Services operational officer and eating ? N   Using the Toilet? N   In  the past six months, have you accidently leaked urine? N   Do you have problems with loss of bowel control? N   Managing your Medications? N   Managing your Finances? Y   Comment Dad assist   Housekeeping or managing your Housekeeping? N    Patient Care Team: [name], MD as PCP - General (Family Medicine) Lloyd Huger, MD as Consulting Physician (Oncology)  Indicate any recent Medical Services you may have received from other than [facility] providers in the past year (date may be approximate).     Assessment:   This is a routine wellness examination for Michael.  Virtual Visit via Telephone Note  I connected with  Michael [name] on 08/01/21 at 11:30 AM EDT by telephone and verified that I am speaking with the correct person using two identifiers.  Persons participating in the virtual visit: patient/Nurse Health Advisor   I discussed the limitations of performing an evaluation and management service by telehealth. We continued and completed visit with audio only. Some vital signs may be absent or patient reported.   Hearing/Vision screen Hearing Screening - Comments:: Patient is able to hear conversational tones without difficulty.  No issues reported. Vision Screening - Comments:: Wears readers lenses    Dietary issues and exercise activities discussed: Current Exercise Habits: The patient does not participate in regular exercise at present   Goals Addressed             This Visit's Progress    Follow up with primary care provider       As needed.       Depression Screen    08/01/2021   11:49 AM 04/20/2021    1:35 PM 11/03/2020    8:12 AM 11/27/2019   12:58 PM 07/23/2019  8:55 AM 07/26/2018    1:59 PM 03/04/2018    3:32 PM  PHQ 2/9 Scores  PHQ - 2 Score 0 0 0  0 0 3  PHQ- 9 Score      0 10  Exception Documentation    Other- indicate reason in comment box     Not completed    Followed by psychiatry       Fall Risk    04/20/2021    1:35 PM 12/15/2020     1:25 PM 11/03/2020    8:11 AM 07/09/2020    1:05 PM 11/27/2019    1:01 PM  [place] in the past year? 0 0 0 0 0  Number falls in past yr: 0 0 0 0 0  Injury with Fall? 0 0  0   Risk for fall due to : No Fall Risks      Follow up Falls evaluation completed Falls evaluation completed Falls evaluation completed Falls evaluation completed Falls evaluation completed    [place]: Home free of loose throw rugs in walkways, pet beds, electrical cords, etc? Yes  Adequate lighting in your home to reduce risk of falls? Yes   ASSISTIVE DEVICES UTILIZED TO PREVENT FALLS: Life alert/phone alert? Yes  Use of a cane, Wohlers or w/c? No   TIMED UP AND GO: Was the test performed? No .   Cognitive Function:  Patient is alert and oriented x3.       Immunizations Immunization History  Administered Date(s) Administered   Influenza,inj,Quad PF,6+ Mos 11/19/2015, 11/03/2020   Influenza-Unspecified 04/06/2018   Moderna Sars-Covid-2 Vaccination 05/16/2019, 06/17/2019   Tdap 11/19/2015, 10/31/2017   Screening Tests Health Maintenance  Topic Date Due   COVID-19 Vaccine (3 - Moderna risk series) 08/17/2021 (Originally 07/15/2019)   COLONOSCOPY (Pts 45-91yr Insurance coverage will need to be confirmed)  08/31/2021 (Originally 07/22/2020)   INFLUENZA VACCINE  09/20/2021   TETANUS/TDAP  11/01/2027   Hepatitis C Screening  Completed   HIV Screening  Completed   HPV VACCINES  Aged Out   Health Maintenance There are no preventive care reminders to display for this patient.  Colonoscopy- followed by GI. Postponed per patient preference at this time.  DG Chest 2 View: completed 04/20/21  Vision Screening: Recommended annual ophthalmology exams for early detection of glaucoma and other disorders of the eye.  Dental Screening: Recommended annual dental exams for proper oral hygiene  Community Resource Referral / Chronic Care Management: CRR required this  visit?  No   CCM required this visit?  No      Plan:   Keep all routine maintenance appointments.   I have personally reviewed and noted the following in the patient's chart:   Medical and social history Use of alcohol, tobacco or illicit drugs  Current medications and supplements including opioid prescriptions. Patient is not currently taking opioid prescriptions. Functional ability and status Nutritional status Physical activity Advanced directives List of other physicians Hospitalizations, surgeries, and ER visits in previous 12 months Vitals Screenings to include cognitive, depression, and falls Referrals and appointments  In addition, I have reviewed and discussed with patient certain preventive protocols, quality metrics, and best practice recommendations. A written personalized care plan for preventive services as well as general preventive health recommendations were provided to patient.     OVarney Biles LPN   [id number]

## 2021-08-01 NOTE — Patient Instructions (Addendum)
  [name] , Thank you for taking time to come for your Medicare Wellness Visit. I appreciate your ongoing commitment to your health goals. Please review the following plan we discussed and let me know if I can assist you in the future.   These are the goals we discussed:  Goals      Follow up with primary care provider     As needed.        This is a list of the screening recommended for you and due dates:  Health Maintenance  Topic Date Due   COVID-19 Vaccine (3 - Moderna risk series) 08/17/2021*   Colon Cancer Screening  08/31/2021*   Flu Shot  09/20/2021   Tetanus Vaccine  11/01/2027   Hepatitis C Screening: USPSTF Recommendation to screen - Ages 18-79 yo.  Completed   HIV Screening  Completed   HPV Vaccine  Aged Out  *Topic was postponed. The date shown is not the original due date.

## 2021-08-09 ENCOUNTER — Encounter: Payer: Self-pay | Admitting: Family Medicine

## 2021-08-09 ENCOUNTER — Ambulatory Visit (INDEPENDENT_AMBULATORY_CARE_PROVIDER_SITE_OTHER): Payer: Medicare Other

## 2021-08-09 ENCOUNTER — Ambulatory Visit (INDEPENDENT_AMBULATORY_CARE_PROVIDER_SITE_OTHER): Payer: Medicare Other | Admitting: Family Medicine

## 2021-08-09 VITALS — BP 110/70 | HR 85 | Temp 98.6°F | Ht 67.0 in | Wt 227.8 lb

## 2021-08-09 DIAGNOSIS — M545 Low back pain, unspecified: Secondary | ICD-10-CM

## 2021-08-09 DIAGNOSIS — J690 Pneumonitis due to inhalation of food and vomit: Secondary | ICD-10-CM | POA: Diagnosis not present

## 2021-08-09 DIAGNOSIS — G935 Compression of brain: Secondary | ICD-10-CM | POA: Diagnosis not present

## 2021-08-09 DIAGNOSIS — F419 Anxiety disorder, unspecified: Secondary | ICD-10-CM

## 2021-08-09 DIAGNOSIS — F1090 Alcohol use, unspecified, uncomplicated: Secondary | ICD-10-CM

## 2021-08-09 DIAGNOSIS — R569 Unspecified convulsions: Secondary | ICD-10-CM | POA: Diagnosis not present

## 2021-08-09 DIAGNOSIS — R0789 Other chest pain: Secondary | ICD-10-CM

## 2021-08-09 DIAGNOSIS — G8929 Other chronic pain: Secondary | ICD-10-CM

## 2021-08-09 DIAGNOSIS — R059 Cough, unspecified: Secondary | ICD-10-CM | POA: Diagnosis not present

## 2021-08-09 MED ORDER — LEVETIRACETAM 500 MG PO TABS: 500.0000 mg | ORAL_TABLET | Freq: Two times a day (BID) | ORAL | 0 refills | Status: DC

## 2021-08-09 NOTE — Patient Instructions (Addendum)
Nice to see you. Neurosurgery should contact you to schedule an appointment. I refilled your Keppra.  I will communicate with your neurologist to see what they recommend.  It looks like you may already have a neurology appointment scheduled with [facility] on 6/26.  Please contact [facility] neurology or your psychiatrist to check on that appointment with [facility] neurology. We will get your chest x-ray and back x-ray today. You cannot drive until you have been seizure-free for 6 months. Do not take any baths or get in a pool due to your seizures.  You should only take a shower if there is somebody at home with you.  Do not climb any trees or get on any ladders.  Do not operate any heavy machinery or other machinery.

## 2021-08-10 ENCOUNTER — Telehealth: Payer: Self-pay | Admitting: Neurology

## 2021-08-10 ENCOUNTER — Telehealth: Payer: Self-pay | Admitting: Family Medicine

## 2021-08-10 DIAGNOSIS — G935 Compression of brain: Secondary | ICD-10-CM | POA: Insufficient documentation

## 2021-08-10 DIAGNOSIS — M545 Other chronic pain: Secondary | ICD-10-CM | POA: Insufficient documentation

## 2021-08-10 NOTE — Telephone Encounter (Signed)
I called and spoke with the patient and informed him that he would received a call from Dr. Georgie Chard office to schedule a follow up with him about his seizures and he understood.  Bertrice Leder,cma

## 2021-08-10 NOTE — Telephone Encounter (Signed)
Called and left message to work patient in after a hospital admission  with [facility]
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
## 2021-08-10 NOTE — Assessment & Plan Note (Signed)
I suspect his right-sided chest discomfort is related to a musculoskeletal cause.  There is no specific cause seen on CT angiogram.  He had a negative troponin at [facility].  EKG from 07/20/2021 is reassuring.

## 2021-08-10 NOTE — Assessment & Plan Note (Signed)
He is outside of the typical window for alcohol withdrawal symptoms.  He was encouraged to abstain from drinking alcohol.

## 2021-08-10 NOTE — Assessment & Plan Note (Signed)
We will check a lumbar spine plain film given that this is an ongoing issue.

## 2021-08-10 NOTE — Assessment & Plan Note (Signed)
His reported episodes are concerning for seizure activity given his description.  He does need to follow-up with neurology.  I will contact his local neurologist and get their input.  Discussed that he Michael [name] is not allowed to drive until he is 6 months seizure-free.  Advised that he should not take any baths or swim at all given risk of drowning.  Discussed he should only take a shower if there is somebody at home with him.  He has a Visual merchandiser that we will contact emergency services if he has a fall or is down for certain period of time.  I also discussed that he should not climb any ladders or trees and not operate any machinery or equipment that could be dangerous.  He will continue his Keppra 500 mg twice daily.

## 2021-08-10 NOTE — Telephone Encounter (Signed)
-----   Message from Pieter Partridge, DO sent at 08/10/2021  1:50 PM EDT ----- We will contact him to follow up Thank you for update  Adam ----- Message ----- From: [name], MD Sent: 08/10/2021   1:04 PM EDT To: Pieter Partridge, DO  Hi,   I saw Mr Laminack for follow-up yesterday. It looks like you saw him in May for his headaches. He has had some new issues come up since then and I wanted to see about having him follow-up with you. He had a seizure in May when his Valium was suddenly withheld at rehab.  He was hospitalized at [facility] for that.  He was subsequently hospitalized within [facility] at the end of May and started on Keppra 500 mg twice daily.  He had an EEG that did not reveal any seizure activity.  He has had some episodes since discharge that seem consistent with seizures based on his description.  Some of this may have been in the setting of alcohol use and possible alcohol withdrawal.  I have outlined his history within my note with the description of his symptoms.  He has not used any alcohol over the last 7 days.  His last seizure-like episode was 5 days ago.  I wanted to see when you would want to see him in clinic for follow-up and see if his Keppra needed to be adjusted prior to following up.  Thanks for your help.  Randall Hiss

## 2021-08-10 NOTE — Assessment & Plan Note (Signed)
Benign exam today.  He has completed antibiotics for this.  We will check a chest x-ray given his persistent cough.

## 2021-08-10 NOTE — Telephone Encounter (Signed)
Please let the patient know that Dr Georgie Chard office with contact him to set up follow-up for his seizure activity.

## 2021-08-10 NOTE — Assessment & Plan Note (Signed)
Refer to neurosurgery for follow-up on this.  Discussed this could be contributing to his headaches.

## 2021-08-10 NOTE — Assessment & Plan Note (Signed)
Chronic ongoing issue.  He will continue to see his psychiatrist who is managing his Valium taper and his other medications.

## 2021-08-11 ENCOUNTER — Other Ambulatory Visit: Payer: Self-pay

## 2021-08-11 DIAGNOSIS — R053 Chronic cough: Secondary | ICD-10-CM

## 2021-08-15 DIAGNOSIS — G479 Sleep disorder, unspecified: Secondary | ICD-10-CM | POA: Diagnosis not present

## 2021-08-15 DIAGNOSIS — R569 Unspecified convulsions: Secondary | ICD-10-CM | POA: Diagnosis not present

## 2021-08-15 DIAGNOSIS — R519 Headache, unspecified: Secondary | ICD-10-CM | POA: Diagnosis not present

## 2021-08-15 DIAGNOSIS — R413 Other amnesia: Secondary | ICD-10-CM | POA: Diagnosis not present

## 2021-08-16 ENCOUNTER — Ambulatory Visit (INDEPENDENT_AMBULATORY_CARE_PROVIDER_SITE_OTHER): Payer: Medicare Other | Admitting: Neurology

## 2021-08-16 ENCOUNTER — Encounter: Payer: Self-pay | Admitting: Neurology

## 2021-08-16 VITALS — BP 128/83 | HR 74 | Ht 68.0 in | Wt 223.8 lb

## 2021-08-16 DIAGNOSIS — F1320 Sedative, hypnotic or anxiolytic dependence, uncomplicated: Secondary | ICD-10-CM

## 2021-08-16 DIAGNOSIS — G43109 Migraine with aura, not intractable, without status migrainosus: Secondary | ICD-10-CM | POA: Diagnosis not present

## 2021-08-16 DIAGNOSIS — R569 Unspecified convulsions: Secondary | ICD-10-CM

## 2021-08-16 DIAGNOSIS — F1090 Alcohol use, unspecified, uncomplicated: Secondary | ICD-10-CM

## 2021-08-16 DIAGNOSIS — G935 Compression of brain: Secondary | ICD-10-CM

## 2021-08-30 ENCOUNTER — Ambulatory Visit: Payer: Medicare Other | Admitting: Gastroenterology

## 2021-09-17 NOTE — Progress Notes (Unsigned)
[place]  [phone]) [phone] [phone]  ID: Michael [name] OB: April 21, 1990  MR#: [id number]  WCB#:[id number]  Patient Care Team: [name], MD as PCP - General (Family Medicine) Lloyd Huger, MD as Consulting Physician (Oncology)  CHIEF COMPLAINT: Eosinophilia.  INTERVAL HISTORY: Patient returns to clinic today for repeat laboratory work and further evaluation.  He was recently diagnosed with a seizure disorder and initiated on Keppra.  He otherwise feels well.  He has no neurologic complaints.  He denies any recent fevers or illnesses.  He has a good appetite and denies weight loss.  He has no new medications.  He denies any chest pain, shortness of breath, cough, or hemoptysis.  He denies any nausea, vomiting, constipation, or diarrhea.  He has no urinary complaints.  Patient offers no further specific complaints today.  REVIEW OF SYSTEMS:   Review of Systems  Constitutional: Negative.  Negative for fever, malaise/fatigue and weight loss.  Respiratory: Negative.  Negative for cough, hemoptysis and shortness of breath.   Cardiovascular: Negative.  Negative for chest pain and leg swelling.  Gastrointestinal: Negative.  Negative for abdominal pain.  Genitourinary: Negative.   Musculoskeletal: Negative.  Negative for myalgias.  Skin: Negative.  Negative for rash.  Neurological:  Positive for seizures. Negative for dizziness, focal weakness, weakness and headaches.  Psychiatric/Behavioral: Negative.  The patient is not nervous/anxious.     As per HPI. Otherwise, a complete review of systems is negative.  PAST MEDICAL HISTORY: Past Medical History:  Diagnosis Date   Allergy    Anal fissure 04/06/2017   Anxiety    Asthma    Chest pain off and on   COVID-19    02/2020   Depression    Eosinophilia    Family history of adverse reaction to anesthesia    adopted history unknown   GERD (gastroesophageal reflux disease)     Pancreatitis last 2015   Substance abuse ([place])    per pt heroin/cocaine age [age]     PAST SURGICAL HISTORY: Past Surgical History:  Procedure Laterality Date   CHOLECYSTECTOMY  08/16/2015   Procedure: LAPAROSCOPIC CHOLECYSTECTOMY;  Surgeon: Alphonsa Overall, MD;  Location: [facility];  Service: General;;   ESOPHAGOGASTRODUODENOSCOPY (EGD) WITH PROPOFOL N/A 07/27/2016   Procedure: ESOPHAGOGASTRODUODENOSCOPY (EGD) WITH PROPOFOL;  Surgeon: Milus Banister, MD;  Location: [facility] END[facility];  Service: Endoscopy;  Laterality: N/A;   ESOPHAGOGASTRODUODENOSCOPY (EGD) WITH PROPOFOL N/A 10/10/2017   Procedure: ESOPHAGOGASTRODUODENOSCOPY (EGD) WITH PROPOFOL;  Surgeon: Virgel Manifold, MD;  Location: [facility] ENDOSCOPY;  Service: Endoscopy;  Laterality: N/A;   TONSILLECTOMY AND ADENOIDECTOMY  as child   and adenoids   XI ROBOTIC LAPAROSCOPIC ASSISTED APPENDECTOMY N/A 04/29/2021   Procedure: XI ROBOTIC LAPAROSCOPIC ASSISTED APPENDECTOMY;  Surgeon: Benjamine Sprague, DO;  Location: [facility] ORS;  Service: General;  Laterality: N/A;    FAMILY HISTORY: Family History  Adopted: Yes  Problem Relation Age of Onset   Diabetes Paternal Grandfather    Lung cancer Paternal Grandfather    Ulcers Father    Colon cancer Neg Hx     ADVANCED DIRECTIVES (Y/N):  N  HEALTH MAINTENANCE: Social History   Tobacco Use   Smoking status: Every Day    Packs/day: 1.50    Years: 15.00    Total pack years: 22.50    Types: Cigarettes   Smokeless tobacco: Former    Types: Chew    Quit date: 08/12/2017   Tobacco comments:    1 pack per day-09/19/2021  Vaping  Use   Vaping Use: Never used  Substance Use Topics   Alcohol use: Yes    Alcohol/week: 24.0 standard drinks of alcohol    Types: 24 Cans of beer per week    Comment: 24 per day   Drug use: Yes    Frequency: 14.0 times per week    Types: Marijuana    Comment: heroin, cocaine, acid, pain killers; last use yrs ago, marijuana daily use 08/13/18      Colonoscopy:  PAP:  Bone  density:  Lipid panel:  Allergies  Allergen Reactions   Bee Venom Anaphylaxis   Quetiapine Rash   Serotonin Reuptake Inhibitors (Ssris) Rash   Amoxicillin Diarrhea    Has patient had a PCN reaction causing immediate rash, facial/tongue/throat swelling, SOB or lightheadedness with hypotension: No Has patient had a PCN reaction causing severe rash involving mucus membranes or skin necrosis: No Has patient had a PCN reaction that required hospitalization: No Has patient had a PCN reaction occurring within the last 10 years: Yes If all of the above answers are "NO", then may proceed with Cephalosporin use.    Gabapentin Nausea And Vomiting    diarrhea   Prednisone Other (See Comments)    Difficulty breathing   Latuda [Lurasidone Hcl] Rash and Nausea And Vomiting    Current Outpatient Medications  Medication Sig Dispense Refill   ARIPiprazole (ABILIFY) 10 MG tablet Take 10 mg by mouth every morning.     Budeson-Glycopyrrol-Formoterol (BREZTRI AEROSPHERE) 160-9-4.8 MCG/ACT AERO Inhale 2 puffs into the lungs in the morning and at bedtime. 5.9 g 0   diazepam (VALIUM) 5 MG tablet Take 5 mg by mouth in the morning. (Take with 83m tablet to equal 760min morning)     DULoxetine (CYMBALTA) 20 MG capsule Take 20 mg by mouth 2 (two) times daily. (Take with 6055mapsules to equal 70m68mtal)     famotidine-calcium carbonate-magnesium hydroxide (PEPCID COMPLETE) 10-800-165 MG chewable tablet Chew 1 tablet by mouth daily as needed (heartburn or indigestion).     lamoTRIgine (LAMICTAL) 25 MG tablet Take 25 mg by mouth as directed. Week 1: 1 tablet at night. Week 2: 1 tablet in the morning and 1 tablet at night. Week 3: 1 tablet in the morning and 2 tablets at night. Week 4: 2 tablets in the morning and 2 tablets at night. Week 5: 2 tablets in the morning and 3 tablets at night. Week 6: 3 tablets in the morning and 3 tablets at night. Week 7: 3 tablets in the morning and 4 tablets at night. Week 8: 4  tablets in the morning and 4 tablets at night. And continue this dose.     levETIRAcetam (KEPPRA) 500 MG tablet Take 1 tablet (500 mg total) by mouth 2 (two) times daily. 60 tablet 0   omeprazole (PRILOSEC) 40 MG capsule TAKE 1 CAPSULE BY MOUTH ONCE DAILY 30 capsule 2   prazosin (MINIPRESS) 2 MG capsule Take 2 mg by mouth at bedtime. (Take with 5mg 73msule to equal 7mg t63ml)     prazosin (MINIPRESS) 5 MG capsule Take 5 mg by mouth at bedtime. (Take with 2mg ca65mle to equal 7mg tot39m     QUEtiapine (SEROQUEL) 25 MG tablet Take by mouth.     rizatriptan (MAXALT) 10 MG tablet Take 1 tablet (10 mg total) by mouth as needed for migraine (May repeat after 2 hours.  Maximum 2 tablets in 24 hours.). May repeat in 2 hours if needed 10 tablet 5  No current facility-administered medications for this visit.    OBJECTIVE: Vitals:   09/21/21 1017  BP: (!) 128/110  Pulse: 86  Resp: 16  Temp: 98.6 F (37 C)  SpO2: 98%     Body mass index is 33.91 kg/m.    ECOG FS:0 - Asymptomatic  General: Well-developed, well-nourished, no acute distress. Eyes: Pink conjunctiva, anicteric sclera. HEENT: Normocephalic, moist mucous membranes. Lungs: No audible wheezing or coughing. Heart: Regular rate and rhythm. Abdomen: Soft, nontender, no obvious distention. Musculoskeletal: No edema, cyanosis, or clubbing. Neuro: Alert, answering all questions appropriately. Cranial nerves grossly intact. Skin: No rashes or petechiae noted. Psych: Normal affect.    LAB RESULTS:  Lab Results  Component Value Date   NA 138 07/21/2021   K 4.0 07/21/2021   CL 105 07/21/2021   CO2 26 07/21/2021   GLUCOSE 101 (H) 07/21/2021   BUN 14 07/21/2021   CREATININE 0.82 07/21/2021   CALCIUM 9.1 07/21/2021   PROT 7.1 07/21/2021   ALBUMIN 3.9 07/21/2021   AST 31 07/21/2021   ALT 48 (H) 07/21/2021   ALKPHOS 68 07/21/2021   BILITOT 1.0 07/21/2021   GFRNONAA >60 07/21/2021   GFRAA >60 10/30/2017    Lab Results   Component Value Date   WBC 12.1 (H) 09/21/2021   NEUTROABS 7.6 09/21/2021   HGB 17.0 09/21/2021   HCT 48.7 09/21/2021   MCV 94.2 09/21/2021   PLT 204 09/21/2021     STUDIES: CT Chest Wo Contrast  Result Date: 09/20/2021 CLINICAL DATA:  Shortness of breath. Chest pressure and pain. Follow-up recent CT angiography. EXAM: CT CHEST WITHOUT CONTRAST TECHNIQUE: Multidetector CT imaging of the chest was performed following the standard protocol without IV contrast. RADIATION DOSE REDUCTION: This exam was performed according to the departmental dose-optimization program which includes automated exposure control, adjustment of the mA and/or kV according to patient size and/or use of iterative reconstruction technique. COMPARISON:  CT angiography 07/21/2021, radiograph 08/09/2021 FINDINGS: Cardiovascular: The heart is upper normal in size. No pericardial effusion. The thoracic aorta is normal in caliber. Mediastinum/Nodes: No mediastinal adenopathy. Limited assessment for hilar adenopathy on this unenhanced exam. Small hiatal hernia, the esophagus is decompressed. No visible thyroid nodule. Lungs/Pleura: Near completely resolved consolidative opacities in the dependent lungs from prior exam. Mild residual dependent ground-glass may represent mild scarring or hypoventilatory change. There is no new or progressive airspace disease. No pulmonary nodule or mass. No features of pulmonary edema. No pleural fluid. The trachea and central bronchi are patent. Upper Abdomen: No acute findings.  Cholecystectomy. Musculoskeletal: There are no acute or suspicious osseous abnormalities. IMPRESSION: 1. Near completely resolved consolidative opacities in the dependent lungs from prior exam. Mild residual dependent ground-glass may represent mild scarring or hypoventilatory change. 2. No new airspace disease or acute findings. 3. Upper normal heart size. Electronically Signed   By: Keith Rake M.D.   On: 09/20/2021 17:35     ASSESSMENT: Eosinophilia.  PLAN:    Eosinophilia: Chronic and unchanged.  Patient's white blood cell count has ranged from 8.2-16.5 since at least March 2014.  Today's result was 12.1.  His absolute eosinophil count has also been intermittently elevated since November 2014 ranging from 0.3-1.1.  His most recent result is 0.8.  Recent appendectomy revealed an eosinophilic infiltrate of the submucosa and muscularis propria, but the clinical significance of this is unclear.  The mucosa of the appendix was intact without erosion.  LDH, B12, and folate are all within normal limits.  Tryptase levels  and peripheral blood flow cytometry are negative.  No intervention is needed at this time.  Patient does not require bone marrow biopsy.  No further follow-up is necessary.  Continue to monitor CBC with differential 1 or 2 times per year and refer patient back if there are any concerns. Seizure disorder: Continue Keppra as prescribed. Elevated blood pressure: Continue monitoring per primary care.  Patient expressed understanding and was in agreement with this plan. He also understands that He can call clinic at any time with any questions, concerns, or complaints.    Lloyd Huger, MD   09/22/2021 7:28 AM

## 2021-09-19 ENCOUNTER — Encounter: Payer: Self-pay | Admitting: Primary Care

## 2021-09-19 ENCOUNTER — Ambulatory Visit (INDEPENDENT_AMBULATORY_CARE_PROVIDER_SITE_OTHER): Payer: Medicare Other | Admitting: Primary Care

## 2021-09-19 DIAGNOSIS — J449 Chronic obstructive pulmonary disease, unspecified: Secondary | ICD-10-CM | POA: Diagnosis not present

## 2021-09-19 DIAGNOSIS — R0602 Shortness of breath: Secondary | ICD-10-CM | POA: Diagnosis not present

## 2021-09-19 DIAGNOSIS — R0789 Other chest pain: Secondary | ICD-10-CM

## 2021-09-19 DIAGNOSIS — R0781 Pleurodynia: Secondary | ICD-10-CM | POA: Diagnosis not present

## 2021-09-19 MED ORDER — BREZTRI AEROSPHERE 160-9-4.8 MCG/ACT IN AERO: 2.0000 | INHALATION_SPRAY | Freq: Two times a day (BID) | RESPIRATORY_TRACT | 0 refills | Status: DC

## 2021-09-19 NOTE — Addendum Note (Signed)
Addended by: Claudette Head A on: 09/19/2021 09:48 AM   Modules accepted: Orders

## 2021-09-19 NOTE — Patient Instructions (Addendum)
Recommendations: Start SunGard - take two puffs morning and evening (rinse mouth after use) Strongly encourage you quit smoking. Taper amount you smoke and pick a quit date.   Orders: CT chest wo contrast (ordered) EKG re: pleuritic chest pain  Referral: Cardiology (chest pain, cardiomegaly- ordered)  Follow-up: 4-6 weeks with Dr. Patsey Berthold    Steps to Quit Smoking Smoking tobacco is the leading cause of preventable death. It can affect almost every organ in the body. Smoking puts you and people around you at risk for many serious, long-lasting (chronic) diseases. Quitting smoking can be hard, but it is one of the best things that you can do for your health. It is never too late to quit. Do not give up if you cannot quit the first time. Some people need to try many times to quit. Do your best to stick to your quit plan, and talk with your doctor if you have any questions or concerns. How do I get ready to quit? Pick a date to quit. Set a date within the next 2 weeks to give you time to prepare. Write down the reasons why you are quitting. Keep this list in places where you will see it often. Tell your family, friends, and co-workers that you are quitting. Their support is important. Talk with your doctor about the choices that may help you quit. Find out if your health insurance will pay for these treatments. Know the people, places, things, and activities that make you want to smoke (triggers). Avoid them. What first steps can I take to quit smoking? Throw away all cigarettes at home, at work, and in your car. Throw away the things that you use when you smoke, such as ashtrays and lighters. Clean your car. Empty the ashtray. Clean your home, including curtains and carpets. What can I do to help me quit smoking? Talk with your doctor about taking medicines and seeing a counselor. You are more likely to succeed when you do both. If you are pregnant or breastfeeding: Talk  with your doctor about counseling or other ways to quit smoking. Do not take medicine to help you quit smoking unless your doctor tells you to. Quit right away Quit smoking completely, instead of slowly cutting back on how much you smoke over a period of time. Stopping smoking right away may be more successful than slowly quitting. Go to counseling. In-person is best if this is an option. You are more likely to quit if you go to counseling sessions regularly. Take medicine You may take medicines to help you quit. Some medicines need a prescription, and some you can buy over-the-counter. Some medicines may contain a drug called nicotine to replace the nicotine in cigarettes. Medicines may: Help you stop having the desire to smoke (cravings). Help to stop the problems that come when you stop smoking (withdrawal symptoms). Your doctor may ask you to use: Nicotine patches, gum, or lozenges. Nicotine inhalers or sprays. Non-nicotine medicine that you take by mouth. Find resources Find resources and other ways to help you quit smoking and remain smoke-free after you quit. They include: Online chats with a Social worker. Phone quitlines. Printed Furniture conservator/restorer. Support groups or group counseling. Text messaging programs. Mobile phone apps. Use apps on your mobile phone or tablet that can help you stick to your quit plan. Examples of free services include Quit Guide from the CDC and smokefree.gov  What can I do to make it easier to quit?  Talk to your family and  friends. Ask them to support and encourage you. Call a phone quitline, such as 1-800-QUIT-NOW, reach out to support groups, or work with a Social worker. Ask people who smoke to not smoke around you. Avoid places that make you want to smoke, such as: Bars. Parties. Smoke-break areas at work. Spend time with people who do not smoke. Lower the stress in your life. Stress can make you want to smoke. Try these things to lower  stress: Getting regular exercise. Doing deep-breathing exercises. Doing yoga. Meditating. What benefits will I see if I quit smoking? Over time, you may have: A better sense of smell and taste. Less coughing and sore throat. A slower heart rate. Lower blood pressure. Clearer skin. Better breathing. Fewer sick days. Summary Quitting smoking can be hard, but it is one of the best things that you can do for your health. Do not give up if you cannot quit the first time. Some people need to try many times to quit. When you decide to quit smoking, make a plan to help you succeed. Quit smoking right away, not slowly over a period of time. When you start quitting, get help and support to keep you smoke-free. This information is not intended to replace advice given to you by your health care provider. Make sure you discuss any questions you have with your health care provider. Document Revised: 01/28/2021 Document Reviewed: 01/28/2021 Elsevier Patient Education  [place].

## 2021-09-19 NOTE — Assessment & Plan Note (Signed)
-   Patient has symptoms of persistent dyspnea with associated wheezing and productive cough. Worsened after covid. Pulmonary function testing in August 2021 showed no obvious obstructive or restrictive airways disease. He was prescribed Trelegy 2101mg but stopped using d/t thrush symptoms. Recommend starting BSunGardtwo puffs twice daily. We will check CT chest wo contrast as he had some areas of airspace disease and atelectasis in dependent portions of his lungs bilaterally. He is a current smoker and works with several chemicals. FU in 4-8 weeks with Dr. GPatsey Berthold

## 2021-09-19 NOTE — Progress Notes (Signed)
$'@Patient'A$  ID: Michael [name], male    DOB: December 01, 1990, 31 y.o.   MRN: [id number]  Chief Complaint  Patient presents with   Follow-up    SOB with exertion, prod cough with clear to yellow sputum in the morning and wheezing.     Referring provider: [name], MD  HPI: 31 year old male, current everyday smoker.  Past medical history significant for shortness of breath, GERD.  Patient of Dr. Patsey Berthold, last seen in office on 10/15/2019.  09/19/2021 Patient presents today for overdue follow-up. He has dyspnea symptoms at rest and with exertion. Associated wheezing and cough which is especially in the morning. He stopped using Trelegy 218mg inhaler several months ago, he did not notice a benefit while taking amd reported thursh symptoms. He does not currently have albuterol but does reports some improvement when using rescue inhaler. He smokes 1 pack cigarette a day. He no longer smokes marijuana, stopped in 2021. He does take edibles. NO vaping. Occasionally smokes a cigar. Takes CBD gummies. He works doing pressure washing. He is around bleach and other chemicals. CTA in June 2023 showed Extensive areas of airspace consolidation and atelectasis in the dependent portions of the lower lobes of the lungs bilaterally. No definite suspicious appearing pulmonary nodules or masses are noted. Spirometry in August 2021 showed no obvious evidence of obstructive airway disease or restrictive   He also complaints of intermittent chest pains bilaterally which worsen with anger and stress. Chest pain is on and off all day long. Echocardiogram in June 2023 appears normal, ejection fraction 60 to 65% without valvular abnormalities. Borderline left ventricular hypertrophy.    Allergies  Allergen Reactions   Bee Venom Anaphylaxis   Quetiapine Rash   Serotonin Reuptake Inhibitors (Ssris) Rash   Amoxicillin Diarrhea    Has patient had a PCN reaction causing immediate rash, facial/tongue/throat swelling,  SOB or lightheadedness with hypotension: No Has patient had a PCN reaction causing severe rash involving mucus membranes or skin necrosis: No Has patient had a PCN reaction that required hospitalization: No Has patient had a PCN reaction occurring within the last 10 years: Yes If all of the above answers are "NO", then may proceed with Cephalosporin use.    Gabapentin Nausea And Vomiting    diarrhea   Prednisone Other (See Comments)    Difficulty breathing   Latuda [Lurasidone Hcl] Rash and Nausea And Vomiting    Immunization History  Administered Date(s) Administered   Influenza,inj,Quad PF,6+ Mos 11/19/2015, 11/03/2020   Influenza-Unspecified 04/06/2018   Moderna Sars-Covid-2 Vaccination 05/16/2019, 06/17/2019   Tdap 11/19/2015, 10/31/2017    Past Medical History:  Diagnosis Date   Allergy    Anal fissure 04/06/2017   Anxiety    Asthma    Chest pain off and on   COVID-19    02/2020   Depression    Eosinophilia    Family history of adverse reaction to anesthesia    adopted history unknown   GERD (gastroesophageal reflux disease)    Pancreatitis last 2015   Substance abuse ([place]    per pt heroin/cocaine age [age]    Tobacco History: Social History   Tobacco Use  Smoking Status Every Day   Packs/day: 1.50   Years: 15.00   Total pack years: 22.50   Types: Cigarettes  Smokeless Tobacco Former   Types: Chew   Quit date: 08/12/2017  Tobacco Comments   1 pack per day-09/19/2021   Ready to quit: Not Answered Counseling given: Not Answered Tobacco  comments: 1 pack per day-09/19/2021   Outpatient Medications Prior to Visit  Medication Sig Dispense Refill   ARIPiprazole (ABILIFY) 10 MG tablet Take 10 mg by mouth every morning.     diazepam (VALIUM) 5 MG tablet Take 5 mg by mouth in the morning. (Take with '2mg'$  tablet to equal '7mg'$  in morning)     DULoxetine (CYMBALTA) 20 MG capsule Take 20 mg by mouth 2 (two) times daily. (Take with '60mg'$  capsules to equal '80mg'$   total)     famotidine-calcium carbonate-magnesium hydroxide (PEPCID COMPLETE) 10-800-165 MG chewable tablet Chew 1 tablet by mouth daily as needed (heartburn or indigestion).     lamoTRIgine (LAMICTAL) 25 MG tablet Take 25 mg by mouth as directed. Week 1: 1 tablet at night. Week 2: 1 tablet in the morning and 1 tablet at night. Week 3: 1 tablet in the morning and 2 tablets at night. Week 4: 2 tablets in the morning and 2 tablets at night. Week 5: 2 tablets in the morning and 3 tablets at night. Week 6: 3 tablets in the morning and 3 tablets at night. Week 7: 3 tablets in the morning and 4 tablets at night. Week 8: 4 tablets in the morning and 4 tablets at night. And continue this dose.     levETIRAcetam (KEPPRA) 500 MG tablet Take 1 tablet (500 mg total) by mouth 2 (two) times daily. 60 tablet 0   omeprazole (PRILOSEC) 40 MG capsule TAKE 1 CAPSULE BY MOUTH ONCE DAILY 30 capsule 2   prazosin (MINIPRESS) 2 MG capsule Take 2 mg by mouth at bedtime. (Take with '5mg'$  capsule to equal '7mg'$  total)     prazosin (MINIPRESS) 5 MG capsule Take 5 mg by mouth at bedtime. (Take with '2mg'$  capsule to equal '7mg'$  total)     QUEtiapine (SEROQUEL) 25 MG tablet Take by mouth.     rizatriptan (MAXALT) 10 MG tablet Take 1 tablet (10 mg total) by mouth as needed for migraine (May repeat after 2 hours.  Maximum 2 tablets in 24 hours.). May repeat in 2 hours if needed 10 tablet 5   No facility-administered medications prior to visit.      Review of Systems  Review of Systems  Constitutional: Negative.   HENT: Negative.    Respiratory:  Positive for cough, shortness of breath and wheezing.   Cardiovascular:  Positive for chest pain.     Physical Exam  There were no vitals taken for this visit. Physical Exam Constitutional:      Appearance: Normal appearance.  HENT:     Head: Normocephalic and atraumatic.     Mouth/Throat:     Comments: Deffered d/t masking Cardiovascular:     Rate and Rhythm: Normal rate and  regular rhythm.     Comments: No overt LE edema Pulmonary:     Effort: Pulmonary effort is normal.     Breath sounds: Normal breath sounds. No wheezing, rhonchi or rales.     Comments: Poor inspiratory effort. Lungs appear clear Musculoskeletal:        General: Normal range of motion.  Skin:    General: Skin is warm and dry.  Neurological:     General: No focal deficit present.     Mental Status: He is alert and oriented to person, place, and time. Mental status is at baseline.  Psychiatric:        Mood and Affect: Mood normal.        Behavior: Behavior normal.  Thought Content: Thought content normal.        Judgment: Judgment normal.      Lab Results:  CBC    Component Value Date/Time   WBC 7.2 07/20/2021 0633   RBC 4.87 07/20/2021 0633   HGB 15.6 07/20/2021 0633   HGB 16.1 07/09/2020 1504   HCT 47.0 07/20/2021 0633   HCT 46.9 07/09/2020 1504   PLT 241 07/20/2021 0633   PLT 216 07/09/2020 1504   MCV 96.5 07/20/2021 0633   MCV 93 07/09/2020 1504   MCV 92 08/16/2013 1432   MCH 32.0 07/20/2021 0633   MCHC 33.2 07/20/2021 0633   RDW 13.8 07/20/2021 0633   RDW 13.0 07/09/2020 1504   RDW 13.1 08/16/2013 1432   LYMPHSABS 4.5 (H) 07/20/2021 0015   LYMPHSABS 2.8 07/09/2020 1504   LYMPHSABS 2.2 08/16/2013 1432   MONOABS 1.0 07/20/2021 0015   MONOABS 0.8 08/16/2013 1432   EOSABS 1.0 (H) 07/20/2021 0015   EOSABS 0.7 (H) 07/09/2020 1504   EOSABS 0.5 08/16/2013 1432   BASOSABS 0.2 (H) 07/20/2021 0015   BASOSABS 0.1 07/09/2020 1504   BASOSABS 0.1 08/16/2013 1432   BASOSABS 1 08/16/2013 1432    BMET    Component Value Date/Time   NA 138 07/21/2021 0641   NA 140 07/09/2020 1504   NA 141 08/13/2013 2252   K 4.0 07/21/2021 0641   K 4.0 08/13/2013 2252   CL 105 07/21/2021 0641   CL 105 08/13/2013 2252   CO2 26 07/21/2021 0641   CO2 28 08/13/2013 2252   GLUCOSE 101 (H) 07/21/2021 0641   GLUCOSE 92 08/13/2013 2252   BUN 14 07/21/2021 0641   BUN 11 07/09/2020  1504   BUN 9 08/13/2013 2252   CREATININE 0.82 07/21/2021 0641   CREATININE 1.30 08/13/2013 2252   CALCIUM 9.1 07/21/2021 0641   CALCIUM 9.2 08/13/2013 2252   GFRNONAA >60 07/21/2021 0641   GFRNONAA >60 08/13/2013 2252   GFRAA >60 10/30/2017 2249   GFRAA >60 08/13/2013 2252    BNP    Component Value Date/Time   BNP 9.5 07/20/2021 0633    ProBNP No results found for: "PROBNP"  Imaging: No results found.   Assessment & Plan:   Chronic asthmatic bronchitis ([place]) - Patient has symptoms of persistent dyspnea with associated wheezing and productive cough. Worsened after covid. Pulmonary function testing in August 2021 showed no obvious obstructive or restrictive airways disease. He was prescribed Trelegy 2110mg but stopped using d/t thrush symptoms. Recommend starting BSunGardtwo puffs twice daily. We will check CT chest wo contrast as he had some areas of airspace disease and atelectasis in dependent portions of his lungs bilaterally. He is a current smoker and works with several chemicals. FU in 4-8 weeks with Dr. GPatsey Berthold   Pleuritic chest pain - Intermittent chest pain, worse with anger/stress - Checking EKG today - Referring to cardiology d/t cardiomegaly      EMartyn Ehrich NP 09/19/2021

## 2021-09-19 NOTE — Assessment & Plan Note (Signed)
-   Intermittent chest pain, worse with anger/stress - Checking EKG today - Referring to cardiology d/t cardiomegaly

## 2021-09-20 ENCOUNTER — Ambulatory Visit
Admission: RE | Admit: 2021-09-20 | Discharge: 2021-09-20 | Disposition: A | Discharge status: Home / Self Care | Payer: Medicare Other | Source: Ambulatory Visit | Attending: Family Medicine | Admitting: Family Medicine

## 2021-09-20 ENCOUNTER — Other Ambulatory Visit: Payer: Self-pay

## 2021-09-20 DIAGNOSIS — R0602 Shortness of breath: Secondary | ICD-10-CM | POA: Diagnosis not present

## 2021-09-20 DIAGNOSIS — R918 Other nonspecific abnormal finding of lung field: Secondary | ICD-10-CM | POA: Diagnosis not present

## 2021-09-20 DIAGNOSIS — D729 Disorder of white blood cells, unspecified: Secondary | ICD-10-CM

## 2021-09-20 DIAGNOSIS — D721 Eosinophilia, unspecified: Secondary | ICD-10-CM

## 2021-09-21 ENCOUNTER — Inpatient Hospital Stay: Payer: Medicare Other | Attending: Oncology

## 2021-09-21 ENCOUNTER — Inpatient Hospital Stay (HOSPITAL_BASED_OUTPATIENT_CLINIC_OR_DEPARTMENT_OTHER): Payer: Medicare Other | Admitting: Oncology

## 2021-09-21 ENCOUNTER — Encounter: Payer: Self-pay | Admitting: Oncology

## 2021-09-21 VITALS — BP 128/110 | HR 86 | Temp 98.6°F | Resp 16 | Ht 68.0 in | Wt 223.0 lb

## 2021-09-21 DIAGNOSIS — Z833 Family history of diabetes mellitus: Secondary | ICD-10-CM | POA: Diagnosis not present

## 2021-09-21 DIAGNOSIS — Z880 Allergy status to penicillin: Secondary | ICD-10-CM | POA: Diagnosis not present

## 2021-09-21 DIAGNOSIS — Z8616 Personal history of COVID-19: Secondary | ICD-10-CM | POA: Diagnosis not present

## 2021-09-21 DIAGNOSIS — Z79899 Other long term (current) drug therapy: Secondary | ICD-10-CM | POA: Insufficient documentation

## 2021-09-21 DIAGNOSIS — Z801 Family history of malignant neoplasm of trachea, bronchus and lung: Secondary | ICD-10-CM | POA: Insufficient documentation

## 2021-09-21 DIAGNOSIS — D721 Eosinophilia, unspecified: Secondary | ICD-10-CM

## 2021-09-21 DIAGNOSIS — Z888 Allergy status to other drugs, medicaments and biological substances status: Secondary | ICD-10-CM | POA: Insufficient documentation

## 2021-09-21 DIAGNOSIS — G40909 Epilepsy, unspecified, not intractable, without status epilepticus: Secondary | ICD-10-CM | POA: Diagnosis not present

## 2021-09-21 DIAGNOSIS — Z7289 Other problems related to lifestyle: Secondary | ICD-10-CM | POA: Diagnosis not present

## 2021-09-21 DIAGNOSIS — Z9049 Acquired absence of other specified parts of digestive tract: Secondary | ICD-10-CM | POA: Diagnosis not present

## 2021-09-21 DIAGNOSIS — F419 Anxiety disorder, unspecified: Secondary | ICD-10-CM | POA: Insufficient documentation

## 2021-09-21 DIAGNOSIS — F17210 Nicotine dependence, cigarettes, uncomplicated: Secondary | ICD-10-CM | POA: Diagnosis not present

## 2021-09-21 DIAGNOSIS — K449 Diaphragmatic hernia without obstruction or gangrene: Secondary | ICD-10-CM | POA: Diagnosis not present

## 2021-09-21 DIAGNOSIS — R0789 Other chest pain: Secondary | ICD-10-CM | POA: Insufficient documentation

## 2021-09-21 DIAGNOSIS — D729 Disorder of white blood cells, unspecified: Secondary | ICD-10-CM

## 2021-09-21 DIAGNOSIS — Z91030 Bee allergy status: Secondary | ICD-10-CM | POA: Diagnosis not present

## 2021-09-21 LAB — CBC WITH DIFFERENTIAL/PLATELET
Abs Immature Granulocytes: 0.05 10*3/uL (ref 0.00–0.07)
Basophils Absolute: 0.1 10*3/uL (ref 0.0–0.1)
Basophils Relative: 1 %
Eosinophils Absolute: 0.8 10*3/uL — ABNORMAL HIGH (ref 0.0–0.5)
Eosinophils Relative: 7 %
HCT: 48.7 % (ref 39.0–52.0)
Hemoglobin: 17 g/dL (ref 13.0–17.0)
Immature Granulocytes: 0 %
Lymphocytes Relative: 20 %
Lymphs Abs: 2.4 10*3/uL (ref 0.7–4.0)
MCH: 32.9 pg (ref 26.0–34.0)
MCHC: 34.9 g/dL (ref 30.0–36.0)
MCV: 94.2 fL (ref 80.0–100.0)
Monocytes Absolute: 1.1 10*3/uL — ABNORMAL HIGH (ref 0.1–1.0)
Monocytes Relative: 9 %
Neutro Abs: 7.6 10*3/uL (ref 1.7–7.7)
Neutrophils Relative %: 63 %
Platelets: 204 10*3/uL (ref 150–400)
RBC: 5.17 MIL/uL (ref 4.22–5.81)
RDW: 12.6 % (ref 11.5–15.5)
WBC: 12.1 10*3/uL — ABNORMAL HIGH (ref 4.0–10.5)
nRBC: 0 % (ref 0.0–0.2)

## 2021-09-21 NOTE — Progress Notes (Signed)
Please let patient know CT chest showed near complete resolution of pneumonia, some residual scaring. No new areas of consolidation or acute findings. Nothing further

## 2021-09-22 NOTE — Progress Notes (Signed)
Agree with the details of the visit as noted by Elizabeth Walsh, NP.  C. Laura Emir Nack, MD [facility] 

## 2021-09-28 ENCOUNTER — Encounter: Payer: Self-pay | Admitting: Family Medicine

## 2021-09-28 ENCOUNTER — Ambulatory Visit (INDEPENDENT_AMBULATORY_CARE_PROVIDER_SITE_OTHER): Payer: Medicare Other | Admitting: Family Medicine

## 2021-09-28 VITALS — BP 140/70 | HR 74 | Temp 98.6°F | Ht 68.0 in | Wt 227.6 lb

## 2021-09-28 DIAGNOSIS — F1090 Alcohol use, unspecified, uncomplicated: Secondary | ICD-10-CM

## 2021-09-28 DIAGNOSIS — G8929 Other chronic pain: Secondary | ICD-10-CM

## 2021-09-28 DIAGNOSIS — R569 Unspecified convulsions: Secondary | ICD-10-CM

## 2021-09-28 DIAGNOSIS — R7989 Other specified abnormal findings of blood chemistry: Secondary | ICD-10-CM | POA: Diagnosis not present

## 2021-09-28 DIAGNOSIS — M545 Low back pain, unspecified: Secondary | ICD-10-CM

## 2021-09-28 LAB — COMPREHENSIVE METABOLIC PANEL
ALT: 47 U/L (ref 0–53)
AST: 25 U/L (ref 0–37)
Albumin: 4.9 g/dL (ref 3.5–5.2)
Alkaline Phosphatase: 77 U/L (ref 39–117)
BUN: 15 mg/dL (ref 6–23)
CO2: 25 mEq/L (ref 19–32)
Calcium: 9.7 mg/dL (ref 8.4–10.5)
Chloride: 103 mEq/L (ref 96–112)
Creatinine, Ser: 1 mg/dL (ref 0.40–1.50)
GFR: 100.85 mL/min (ref >60.00)
Glucose, Bld: 97 mg/dL (ref 70–99)
Potassium: 4.3 mEq/L (ref 3.5–5.1)
Sodium: 137 mEq/L (ref 135–145)
Total Bilirubin: 0.4 mg/dL (ref 0.2–1.2)
Total Protein: 7.5 g/dL (ref 6.0–8.3)

## 2021-09-28 MED ORDER — NAPROXEN 500 MG PO TABS: 500.0000 mg | ORAL_TABLET | Freq: Two times a day (BID) | ORAL | 0 refills | Status: DC | PRN

## 2021-09-28 NOTE — Assessment & Plan Note (Signed)
I encouraged the patient to continue to reduce his alcohol intake.  Discussed the risk of sudden cessation of alcohol including seizures and death.  We discussed having him reduce to 2 beers per day after a few weeks and then after another few weeks reducing to 1 beer per day and then after another few weeks reducing to a beer every other day.  Discussed if he has issues with continuing to taper off alcohol he can discuss possible treatment regimens with his psychiatrist.

## 2021-09-28 NOTE — Patient Instructions (Signed)
Nice to see you. Please continue to work on cutting down on alcohol intake.  We discussed cutting down to 2 beers a day after a few weeks and then a few weeks later cutting to 1 beer a day and then a few weeks later cutting to 1 beer every other day and then trying to discontinue your alcohol intake.  If you are having difficulty with this you can discuss further with your psychiatrist. Will get lab work today. We will try naproxen to see if that will help with your back pain.  If your back pain is not improving please let us know. If you develop worsening back pain, numbness, weakness, or bowel or bladder incontinence please seek medical attention.

## 2021-09-28 NOTE — Assessment & Plan Note (Addendum)
Acute flare of a chronic issue.  Patient has likely strained muscles in his low back.  He cannot take prednisone given shortness of breath with this.  Tramadol is not an option given his seizure history.  He does not want any narcotics and I would not prescribe any narcotics for back pain.  Discussed a trial of Naprosyn 500 mg to be taken twice daily as needed with food.  If not improving he will let us know.  He will seek medical attention for any worsening pain, numbness, weakness, or bowel or bladder dysfunction.  Given recent use of ibuprofen we will check renal function.

## 2021-09-28 NOTE — Assessment & Plan Note (Signed)
He will continue to see neurology for this issue.

## 2021-09-28 NOTE — Assessment & Plan Note (Signed)
Recheck today. 
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
## 2021-10-01 DIAGNOSIS — S3992XA Unspecified injury of lower back, initial encounter: Secondary | ICD-10-CM | POA: Diagnosis not present

## 2021-10-01 DIAGNOSIS — R55 Syncope and collapse: Secondary | ICD-10-CM | POA: Diagnosis not present

## 2021-10-01 DIAGNOSIS — I10 Essential (primary) hypertension: Secondary | ICD-10-CM | POA: Diagnosis not present

## 2021-10-01 DIAGNOSIS — S3993XA Unspecified injury of pelvis, initial encounter: Secondary | ICD-10-CM | POA: Diagnosis not present

## 2021-10-01 DIAGNOSIS — R269 Unspecified abnormalities of gait and mobility: Secondary | ICD-10-CM | POA: Diagnosis not present

## 2021-10-01 DIAGNOSIS — S199XXA Unspecified injury of neck, initial encounter: Secondary | ICD-10-CM | POA: Diagnosis not present

## 2021-10-01 DIAGNOSIS — S299XXA Unspecified injury of thorax, initial encounter: Secondary | ICD-10-CM | POA: Diagnosis not present

## 2021-10-01 DIAGNOSIS — R0689 Other abnormalities of breathing: Secondary | ICD-10-CM | POA: Diagnosis not present

## 2021-10-01 DIAGNOSIS — R569 Unspecified convulsions: Secondary | ICD-10-CM | POA: Diagnosis not present

## 2021-10-01 DIAGNOSIS — S0990XA Unspecified injury of head, initial encounter: Secondary | ICD-10-CM | POA: Diagnosis not present

## 2021-10-01 DIAGNOSIS — W109XXA Fall (on) (from) unspecified stairs and steps, initial encounter: Secondary | ICD-10-CM | POA: Diagnosis not present

## 2021-10-01 DIAGNOSIS — R457 State of emotional shock and stress, unspecified: Secondary | ICD-10-CM | POA: Diagnosis not present

## 2021-10-01 DIAGNOSIS — W19XXXA Unspecified fall, initial encounter: Secondary | ICD-10-CM | POA: Diagnosis not present

## 2021-10-01 DIAGNOSIS — M549 Dorsalgia, unspecified: Secondary | ICD-10-CM | POA: Diagnosis not present

## 2021-10-14 DIAGNOSIS — R569 Unspecified convulsions: Secondary | ICD-10-CM | POA: Diagnosis not present

## 2021-10-21 DIAGNOSIS — R569 Unspecified convulsions: Secondary | ICD-10-CM | POA: Diagnosis not present

## 2021-11-01 DIAGNOSIS — R569 Unspecified convulsions: Secondary | ICD-10-CM | POA: Diagnosis not present

## 2021-11-01 DIAGNOSIS — R413 Other amnesia: Secondary | ICD-10-CM | POA: Diagnosis not present

## 2021-11-01 DIAGNOSIS — G479 Sleep disorder, unspecified: Secondary | ICD-10-CM | POA: Diagnosis not present

## 2021-11-01 DIAGNOSIS — R519 Headache, unspecified: Secondary | ICD-10-CM | POA: Diagnosis not present

## 2021-11-17 ENCOUNTER — Ambulatory Visit: Payer: Medicare Other | Attending: Cardiology | Admitting: Cardiology

## 2021-11-17 ENCOUNTER — Ambulatory Visit: Payer: Medicare Other

## 2021-11-17 ENCOUNTER — Other Ambulatory Visit
Admission: RE | Admit: 2021-11-17 | Discharge: 2021-11-17 | Disposition: A | Discharge status: Home / Self Care | Payer: Medicare Other | Attending: Cardiology | Admitting: Cardiology

## 2021-11-17 ENCOUNTER — Encounter: Payer: Self-pay | Admitting: Cardiology

## 2021-11-17 VITALS — BP 126/86 | HR 89 | Ht 67.0 in | Wt 227.0 lb

## 2021-11-17 DIAGNOSIS — R072 Precordial pain: Secondary | ICD-10-CM | POA: Insufficient documentation

## 2021-11-17 DIAGNOSIS — F17200 Nicotine dependence, unspecified, uncomplicated: Secondary | ICD-10-CM | POA: Diagnosis not present

## 2021-11-17 DIAGNOSIS — R002 Palpitations: Secondary | ICD-10-CM | POA: Diagnosis not present

## 2021-11-17 LAB — BASIC METABOLIC PANEL
Anion gap: 9 (ref 5–15)
BUN: 13 mg/dL (ref 6–20)
CO2: 24 mmol/L (ref 22–32)
Calcium: 9.8 mg/dL (ref 8.9–10.3)
Chloride: 104 mmol/L (ref 98–111)
Creatinine, Ser: 0.97 mg/dL (ref 0.61–1.24)
GFR, Estimated: >60 mL/min (ref >60)
Glucose, Bld: 106 mg/dL — ABNORMAL HIGH (ref 70–99)
Potassium: 4.3 mmol/L (ref 3.5–5.1)
Sodium: 137 mmol/L (ref 135–145)

## 2021-11-17 MED ORDER — METOPROLOL TARTRATE 100 MG PO TABS: 100.0000 mg | ORAL_TABLET | Freq: Once | ORAL | 0 refills | Status: DC

## 2021-11-17 MED ORDER — OMEPRAZOLE 40 MG PO CPDR: 40.0000 mg | DELAYED_RELEASE_CAPSULE | Freq: Every day | ORAL | 5 refills | Status: DC

## 2021-11-17 NOTE — Patient Instructions (Signed)
Medication Instructions:   Your physician has recommended you make the following change in your medication:    START taking Omeprazole (Prilosec) 40 MG once a day.   *If you need a refill on your cardiac medications before your next appointment, please call your pharmacy*   Lab Work:  Please go to the [facility] for a BMP lab draw after your appointment today.   Testing/Procedures:  Your physician has requested that you have cardiac CT. Cardiac computed tomography (CT) is a painless test that uses an x-ray machine to take clear, detailed pictures of your heart.    Your cardiac CT will be scheduled at:  [facility] [address] [place], [place] [address] [phone]  Thursday October 5th at 2:00 PM  Please arrive 15 mins early for check-in and test prep.    Please follow these instructions carefully (unless otherwise directed):   Hold all erectile dysfunction medications at least 3 days (72 hrs) prior to test. (Ie viagra, cialis, sildenafil, tadalafil, etc) We will administer nitroglycerin during this exam.    Night Before the Test: Be sure to Drink plenty of water. Do not consume any caffeinated/decaffeinated beverages or chocolate 12 hours prior to your test.   On the Day of the Test: Drink plenty of water until 1 hour prior to the test. Do not eat any food 4 hours prior to the test. You may take your regular medications prior to the test.  Take metoprolol (Lopressor) 100 MG two hours prior to test. This was sent into your pharmacy. Take Ivabradine (Corlanor) 15 MG two hours prior to test.  This is 3 tablets from the sample bottle we gave you at the office vist)        After the Test: Drink plenty of water. After receiving IV contrast, you may experience a mild flushed feeling. This is normal. On occasion, you may experience a mild rash up to 24 hours after the test. This is not dangerous. If this occurs, you  can take Benadryl 25 mg and increase your fluid intake. If you experience trouble breathing, this can be serious. If it is severe call 911 IMMEDIATELY. If it is mild, please call our office. If you take any of these medications: Glipizide/Metformin, Avandament, Glucavance, please do not take 48 hours after completing test unless otherwise instructed.  Please allow 2-4 weeks for scheduling of routine cardiac CTs. Some insurance companies require a pre-authorization which may delay scheduling of this test.   For non-scheduling related questions, please contact the cardiac imaging nurse navigator should you have any questions/concerns: Marchia Bond, Cardiac Imaging Nurse Navigator Gordy Clement, Cardiac Imaging Nurse Navigator [facility] Direct Office Dial: [phone]   For scheduling needs, including cancellations and rescheduling, please call [place], [phone].    2.   Your physician has recommended that you wear a Zio XT monitor for 2 weeks. This will be mailed to your home address in 4-5 business days.   Your clinician has requested a Zio heart rhythm monitor by i[employer] to be mailed to your home for you to wear for 14 days. You should expect a small box to arrive via USPS (or FedEx in some cases) within this next week. If you do not receive it please call [employer] at [phone].  Closely watching your heart at this time will help your care team understand more and provide information needed to develop your plan of care.  Please apply your Zio patch  monitor the day you receive it. Keep this packaging, you will use this to return your Zio monitor.  You will easily be able to apply the monitor with the instructions provided in the Patient Guide.  If you need assistance, [employer] representatives are available 24/7 at [phone].  You can also download the [facility] app on your phone to view detailed application instructions and log symptoms.  After you  wear your monitor for 14 days, place it back in the blue box or envelope, along with your Symptom Log.  To send your monitor back: Simply use the pre-addressed and pre-paid box/envelope.  Send it back through [employer] the same day you remove it via your local post office or by placing it in your mailbox.  As soon as we receive the results, they will be reviewed and your clinician will contact you.  For the first 24 hours- it is essential to not shower or exercise, to allow the patch to adhere to your skin. Avoid excessive sweating to help maximize wear time. Do not submerge the device, no hot tubs, and no swimming pools. Keep any lotions or oils away from the patch. After 24 hours you may shower with the patch on. Take brief showers with your back facing the shower head.  Do not remove patch once it has been placed because that will interrupt data and decrease adhesive wear time. Push the button when you have any symptoms and write down what you were feeling. Once you have completed wearing your monitor, remove and place into box which has postage paid and place in your outgoing mailbox.  If for some reason you have misplaced your box then call our office and we can provide another box and/or mail it off for you.   Follow-Up: At [facility], you and your health needs are our priority.  As part of our continuing mission to provide you with exceptional heart care, we have created designated Provider Care Teams.  These Care Teams include your primary Cardiologist (physician) and Advanced Practice Providers (APPs -  Physician Assistants and Nurse Practitioners) who all work together to provide you with the care you need, when you need it.  We recommend signing up for the patient portal called "MyChart".  Sign up information is provided on this After Visit Summary.  MyChart is used to connect with patients for Virtual Visits (Telemedicine).  Patients are able to view lab/test results,  encounter notes, upcoming appointments, etc.  Non-urgent messages can be sent to your provider as well.   To learn more about what you can do with MyChart, go to [email or web address].    Your next appointment:   6-8 week(s)  The format for your next appointment:   In Person  Provider:   You may see Kate Sable, MD or one of the following Advanced Practice Providers on your designated Care Team:   Murray Hodgkins, NP Christell Faith, PA-C Cadence Kathlen Mody, PA-C Gerrie Nordmann, NP    Other Instructions   Important Information About Sugar

## 2021-11-17 NOTE — Progress Notes (Signed)
Cardiology Office Note:    Date:  11/17/2021   ID:  Michael [name], DOB 10-08-1990, MRN [id number]  PCP:  [name], MD   [place] Providers Cardiologist:  Kate Sable, MD     Referring MD: [name], MD   Chief Complaint  Patient presents with   New Patient (Initial Visit)    SOB, Pleuritic Chest Pain 6x months, No know medical Hx    History of Present Illness:    Michael [name] is a 31 y.o. male with a hx of anxiety, current smoker x15 years, EtOH abuse, GERD, seizure disorder, who presents due to chest pain and shortness of breath.  States having symptoms of chest pain ongoing over the past 6 months.  Also has a history of reflux although he states current chest pain is different from his typical reflux symptoms.  Was previously on omeprazole for GERD, ran out of prescription 5 months ago.  Currently smokes, is working on quitting.  Chest pain is not associated with exertion.  Complains of palpitations lasting several minutes almost daily.  His smart watch indicates heart rates up to 130s beats per minute  Echocardiogram obtained 07/2021 EF 60 to 40%, diastolic function normal.  Past Medical History:  Diagnosis Date   Allergy    Anal fissure 04/06/2017   Anxiety    Asthma    Chest pain off and on   COVID-19    02/2020   Depression    Eosinophilia    Family history of adverse reaction to anesthesia    adopted history unknown   GERD (gastroesophageal reflux disease)    Pancreatitis last 2015   Substance abuse ([place])    per pt heroin/cocaine age [age]     Past Surgical History:  Procedure Laterality Date   CHOLECYSTECTOMY  08/16/2015   Procedure: LAPAROSCOPIC CHOLECYSTECTOMY;  Surgeon: Alphonsa Overall, MD;  Location: [facility];  Service: General;;   ESOPHAGOGASTRODUODENOSCOPY (EGD) WITH PROPOFOL N/A 07/27/2016   Procedure: ESOPHAGOGASTRODUODENOSCOPY (EGD) WITH PROPOFOL;  Surgeon: Milus Banister, MD;  Location: [facility];  Service:  Endoscopy;  Laterality: N/A;   ESOPHAGOGASTRODUODENOSCOPY (EGD) WITH PROPOFOL N/A 10/10/2017   Procedure: ESOPHAGOGASTRODUODENOSCOPY (EGD) WITH PROPOFOL;  Surgeon: Virgel Manifold, MD;  Location: [facility] END[facility];  Service: Endoscopy;  Laterality: N/A;   TONSILLECTOMY AND ADENOIDECTOMY  as child   and adenoids   XI ROBOTIC LAPAROSCOPIC ASSISTED APPENDECTOMY N/A 04/29/2021   Procedure: XI ROBOTIC LAPAROSCOPIC ASSISTED APPENDECTOMY;  Surgeon: Benjamine Sprague, DO;  Location: [facility] ORS;  Service: General;  Laterality: N/A;    Current Medications: Current Meds  Medication Sig   ARIPiprazole (ABILIFY) 10 MG tablet Take 10 mg by mouth every morning.   Budeson-Glycopyrrol-Formoterol (BREZTRI AEROSPHERE) 160-9-4.8 MCG/ACT AERO Inhale 2 puffs into the lungs in the morning and at bedtime.   diazepam (VALIUM) 5 MG tablet Take 5 mg by mouth in the morning. 2.5 morning, '10mg'$  Evenings   DULoxetine (CYMBALTA) 60 MG capsule Take by mouth.   famotidine-calcium carbonate-magnesium hydroxide (PEPCID COMPLETE) 10-800-165 MG chewable tablet Chew 1 tablet by mouth daily as needed (heartburn or indigestion).   lamoTRIgine (LAMICTAL) 25 MG tablet Take 25 mg by mouth as directed. Week 1: 1 tablet at night. Week 2: 1 tablet in the morning and 1 tablet at night. Week 3: 1 tablet in the morning and 2 tablets at night. Week 4: 2 tablets in the morning and 2 tablets at night. Week 5: 2 tablets in the morning and 3 tablets at  night. Week 6: 3 tablets in the morning and 3 tablets at night. Week 7: 3 tablets in the morning and 4 tablets at night. Week 8: 4 tablets in the morning and 4 tablets at night. And continue this dose.   levETIRAcetam (KEPPRA) 500 MG tablet Take 1 tablet (500 mg total) by mouth 2 (two) times daily.   metoprolol tartrate (LOPRESSOR) 100 MG tablet Take 1 tablet (100 mg total) by mouth once for 1 dose. Take 2 hours prior to your CT scan.   nortriptyline (PAMELOR) 10 MG capsule Take 10 mg by mouth at bedtime.    prazosin (MINIPRESS) 2 MG capsule Take 2 mg by mouth at bedtime. (Take with '5mg'$  capsule to equal '7mg'$  total)   prazosin (MINIPRESS) 5 MG capsule Take 5 mg by mouth at bedtime. (Take with '2mg'$  capsule to equal '7mg'$  total)   QUEtiapine (SEROQUEL) 25 MG tablet Take by mouth.   traZODone (DESYREL) 50 MG tablet Take 50 mg by mouth once a week.     Allergies:   Bee venom, Quetiapine, Serotonin reuptake inhibitors (ssris), Amoxicillin, Gabapentin, Prednisone, and Latuda [lurasidone hcl]   Social History   Socioeconomic History   Marital status: Single    Spouse name: Not on file   Number of children: 2   Years of education: 12   Highest education level: 12th grade  Occupational History    Comment: Engineer, building services   Occupation: pressure washer  Tobacco Use   Smoking status: Every Day    Packs/day: 1.50    Years: 15.00    Total pack years: 22.50    Types: Cigarettes   Smokeless tobacco: Former    Types: Chew    Quit date: 08/12/2017   Tobacco comments:    1 pack per day-09/19/2021  Vaping Use   Vaping Use: Never used  Substance and Sexual Activity   Alcohol use: Yes    Alcohol/week: 24.0 standard drinks of alcohol    Types: 24 Cans of beer per week    Comment: 24 per day   Drug use: Yes    Frequency: 14.0 times per week    Types: Marijuana    Comment: heroin, cocaine, acid, pain killers; last use yrs ago, marijuana daily use 08/13/18    Sexual activity: Not Currently    Partners: Female  Other Topics Concern   Not on file  Social History Narrative   Lives at home with parents.      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, jewellery - Completed high school      Hobbies - computers, outdoors, fishing      Diet - regular      Exercise - none   Caffeine- coffee, 2 pots daily       H/o cocaine/heroin abuse 18 -2 y.o    smoker   Social Determinants of Health   Financial Resource Strain: Low Risk   (08/01/2021)   Overall Financial Resource Strain (CARDIA)    Difficulty of Paying Living Expenses: Not very hard  Food Insecurity: No Food Insecurity (08/01/2021)   Hunger Vital Sign    Worried About Running Out of Food in the Last Year: Never true    Ran Out of Food in the Last Year: Never true  Transportation Needs: No Transportation Needs (08/01/2021)   PRAPARE - Hydrologist (Medical): No  Lack of Transportation (Non-Medical): No  Physical Activity: Insufficiently Active (08/01/2021)   Exercise Vital Sign    Days of Exercise per Week: 2 days    Minutes of Exercise per Session: 10 min  Stress: Stress Concern Present (08/01/2021)   [place]    Feeling of Stress : Rather much  Social Connections: Socially Isolated (08/01/2021)   Social Connection and Isolation Panel [NHANES]    Frequency of Communication with Friends and Family: Once a week    Frequency of Social Gatherings with Friends and Family: Once a week    Attends Religious Services: Never    Marine scientist or Organizations: No    Attends Music therapist: Not on file    Marital Status: Never married     Family History: The patient's family history includes Diabetes in his paternal grandfather; Lung cancer in his paternal grandfather; Ulcers in his father. There is no history of Colon cancer. He was adopted.  ROS:   Please see the history of present illness.     All other systems reviewed and are negative.  EKGs/Labs/Other Studies Reviewed:    The following studies were reviewed today:   EKG:  EKG is  ordered today.  The ekg ordered today demonstrates normal sinus rhythm, normal ECG.  Recent Labs: 07/20/2021: B Natriuretic Peptide 9.5 07/21/2021: Magnesium 2.2 09/21/2021: Hemoglobin 17.0; Platelets 204 09/28/2021: ALT 47; BUN 15; Creatinine, Ser 1.00; Potassium 4.3; Sodium 137  Recent Lipid Panel No results  found for: "CHOL", "TRIG", "HDL", "CHOLHDL", "VLDL", "LDLCALC", "LDLDIRECT"   Risk Assessment/Calculations:             Physical Exam:    VS:  BP 126/86 (BP Location: Right Arm, Patient Position: Sitting, Cuff Size: Large)   Pulse 89   Ht '5\' 7"'$  (1.702 m)   Wt 227 lb (103 kg)   SpO2 96%   BMI 35.55 kg/m     Wt Readings from Last 3 Encounters:  11/17/21 227 lb (103 kg)  09/28/21 227 lb 9.6 oz (103.2 kg)  09/21/21 223 lb (101.2 kg)     GEN:  Well nourished, well developed in no acute distress HEENT: Normal NECK: No JVD; No carotid bruits CARDIAC: RRR, no murmurs, rubs, gallops RESPIRATORY: Clear anteriorly, mild rhonchi at left base ABDOMEN: Soft, non-tender, non-distended MUSCULOSKELETAL:  No edema; No deformity  SKIN: Warm and dry NEUROLOGIC:  Alert and oriented x 3 PSYCHIATRIC:  Normal affect   ASSESSMENT:    1. Precordial pain   2. Palpitation   3. Smoking    PLAN:    In order of problems listed above:  Chest pain, echo was normal, get coronary CTA to rule out CAD.  Advised to start taking omeprazole 40 mg daily.  Refill sent. Palpitations, place cardiac monitor to evaluate any significant arrhythmias. Current smoker, smoking cessation advised.  Follow-up after cardiac testing.      Medication Adjustments/Labs and Tests Ordered: Current medicines are reviewed at length with the patient today.  Concerns regarding medicines are outlined above.  Orders Placed This Encounter  Procedures   CT CORONARY MORPH W/CTA COR W/SCORE W/CA W/CM &/OR WO/CM   Basic metabolic panel   LONG TERM MONITOR (3-14 DAYS)   EKG 12-Lead   Meds ordered this encounter  Medications   omeprazole (PRILOSEC) 40 MG capsule    Sig: Take 1 capsule (40 mg total) by mouth daily.    Dispense:  30 capsule  Refill:  5    FOR NEXT FILL   metoprolol tartrate (LOPRESSOR) 100 MG tablet    Sig: Take 1 tablet (100 mg total) by mouth once for 1 dose. Take 2 hours prior to your CT scan.     Dispense:  1 tablet    Refill:  0    Patient Instructions  Medication Instructions:   Your physician has recommended you make the following change in your medication:    START taking Omeprazole (Prilosec) 40 MG once a day.   *If you need a refill on your cardiac medications before your next appointment, please call your pharmacy*   Lab Work:  Please go to the [facility] for a BMP lab draw after your appointment today.   Testing/Procedures:  Your physician has requested that you have cardiac CT. Cardiac computed tomography (CT) is a painless test that uses an x-ray machine to take clear, detailed pictures of your heart.    Your cardiac CT will be scheduled at:  [facility] [address] [place], [place] [address] [phone]  Thursday October 5th at 2:00 PM  Please arrive 15 mins early for check-in and test prep.    Please follow these instructions carefully (unless otherwise directed):   Hold all erectile dysfunction medications at least 3 days (72 hrs) prior to test. (Ie viagra, cialis, sildenafil, tadalafil, etc) We will administer nitroglycerin during this exam.    Night Before the Test: Be sure to Drink plenty of water. Do not consume any caffeinated/decaffeinated beverages or chocolate 12 hours prior to your test.   On the Day of the Test: Drink plenty of water until 1 hour prior to the test. Do not eat any food 4 hours prior to the test. You may take your regular medications prior to the test.  Take metoprolol (Lopressor) 100 MG two hours prior to test. This was sent into your pharmacy. Take Ivabradine (Corlanor) 15 MG two hours prior to test.  This is 3 tablets from the sample bottle we gave you at the office vist)        After the Test: Drink plenty of water. After receiving IV contrast, you may experience a mild flushed feeling. This is normal. On occasion, you may experience a mild rash up to 24  hours after the test. This is not dangerous. If this occurs, you can take Benadryl 25 mg and increase your fluid intake. If you experience trouble breathing, this can be serious. If it is severe call 911 IMMEDIATELY. If it is mild, please call our office. If you take any of these medications: Glipizide/Metformin, Avandament, Glucavance, please do not take 48 hours after completing test unless otherwise instructed.  Please allow 2-4 weeks for scheduling of routine cardiac CTs. Some insurance companies require a pre-authorization which may delay scheduling of this test.   For non-scheduling related questions, please contact the cardiac imaging nurse navigator should you have any questions/concerns: Marchia Bond, Cardiac Imaging Nurse Navigator Gordy Clement, Cardiac Imaging Nurse Navigator [facility] Direct Office Dial: [phone]   For scheduling needs, including cancellations and rescheduling, please call [place], [phone].    2.   Your physician has recommended that you wear a Zio XT monitor for 2 weeks. This will be mailed to your home address in 4-5 business days.   Your clinician has requested a Zio heart rhythm monitor by [employer] to be mailed to your home for you to wear for 14 days.  You should expect a small box to arrive via USPS (or FedEx in some cases) within this next week. If you do not receive it please call [employer] at [phone].  Closely watching your heart at this time will help your care team understand more and provide information needed to develop your plan of care.  Please apply your Zio patch monitor the day you receive it. Keep this packaging, you will use this to return your Zio monitor.  You will easily be able to apply the monitor with the instructions provided in the Patient Guide.  If you need assistance, [employer] representatives are available 24/7 at [phone].  You can also download the [facility] app on your phone to view  detailed application instructions and log symptoms.  After you wear your monitor for 14 days, place it back in the blue box or envelope, along with your Symptom Log.  To send your monitor back: Simply use the pre-addressed and pre-paid box/envelope.  Send it back through [employer] the same day you remove it via your local post office or by placing it in your mailbox.  As soon as we receive the results, they will be reviewed and your clinician will contact you.  For the first 24 hours- it is essential to not shower or exercise, to allow the patch to adhere to your skin. Avoid excessive sweating to help maximize wear time. Do not submerge the device, no hot tubs, and no swimming pools. Keep any lotions or oils away from the patch. After 24 hours you may shower with the patch on. Take brief showers with your back facing the shower head.  Do not remove patch once it has been placed because that will interrupt data and decrease adhesive wear time. Push the button when you have any symptoms and write down what you were feeling. Once you have completed wearing your monitor, remove and place into box which has postage paid and place in your outgoing mailbox.  If for some reason you have misplaced your box then call our office and we can provide another box and/or mail it off for you.   Follow-Up: At [facility], you and your health needs are our priority.  As part of our continuing mission to provide you with exceptional heart care, we have created designated Provider Care Teams.  These Care Teams include your primary Cardiologist (physician) and Advanced Practice Providers (APPs -  Physician Assistants and Nurse Practitioners) who all work together to provide you with the care you need, when you need it.  We recommend signing up for the patient portal called "MyChart".  Sign up information is provided on this After Visit Summary.  MyChart is used to connect with patients for Virtual Visits  (Telemedicine).  Patients are able to view lab/test results, encounter notes, upcoming appointments, etc.  Non-urgent messages can be sent to your provider as well.   To learn more about what you can do with MyChart, go to [email or web address].    Your next appointment:   6-8 week(s)  The format for your next appointment:   In Person  Provider:   You may see Kate Sable, MD or one of the following Advanced Practice Providers on your designated Care Team:   Murray Hodgkins, NP Christell Faith, PA-C Cadence Kathlen Mody, PA-C Gerrie Nordmann, NP    Other Instructions   Important Information About Sugar         Signed, Kate Sable, MD  11/17/2021 11:55 AM    [facility]

## 2021-11-21 ENCOUNTER — Telehealth: Payer: Self-pay | Admitting: Cardiology

## 2021-11-21 ENCOUNTER — Encounter: Payer: Self-pay | Admitting: Cardiology

## 2021-11-23 ENCOUNTER — Telehealth (HOSPITAL_COMMUNITY): Payer: Self-pay | Admitting: Emergency Medicine

## 2021-11-23 NOTE — Telephone Encounter (Signed)
Attempted to call patient regarding upcoming cardiac CT appointment. °Left message on voicemail with name and callback number °Taralyn Ferraiolo RN Navigator Cardiac Imaging °[facility] °[phone] Office °[phone] Cell ° °

## 2021-11-24 ENCOUNTER — Ambulatory Visit
Admission: RE | Admit: 2021-11-24 | Discharge: 2021-11-24 | Disposition: A | Discharge status: Home / Self Care | Payer: Medicare Other | Source: Ambulatory Visit | Attending: Cardiology | Admitting: Cardiology

## 2021-11-24 DIAGNOSIS — R072 Precordial pain: Secondary | ICD-10-CM | POA: Diagnosis not present

## 2021-11-24 MED ORDER — NITROGLYCERIN 0.4 MG SL SUBL
0.8000 mg | SUBLINGUAL_TABLET | Freq: Once | SUBLINGUAL | Status: AC
  Administered 2021-11-24: 0.8 mg via SUBLINGUAL

## 2021-11-24 MED ORDER — METOPROLOL TARTRATE 5 MG/5ML IV SOLN
INTRAVENOUS | Status: AC
  Filled 2021-11-24: qty 10

## 2021-11-24 MED ORDER — IOHEXOL 350 MG/ML SOLN: 100.0000 mL | Freq: Once | INTRAVENOUS | Status: DC | PRN

## 2021-11-24 MED ORDER — METOPROLOL TARTRATE 5 MG/5ML IV SOLN
10.0000 mg | Freq: Once | INTRAVENOUS | Status: AC
  Administered 2021-11-24: 10 mg via INTRAVENOUS

## 2021-11-24 NOTE — Progress Notes (Signed)
Patient tolerated procedure well. Ambulate w/o difficulty. Denies light headedness or being dizzy. Encouraged to drink extra water today and reasoning explained. Verbalized understanding. All questions answered. ABC intact. No further needs. Discharge from procedure area w/o issues.

## 2021-11-25 ENCOUNTER — Ambulatory Visit: Payer: Medicare Other | Admitting: Neurology

## 2021-11-25 MED ORDER — IOHEXOL 350 MG/ML SOLN
100.0000 mL | Freq: Once | INTRAVENOUS | Status: AC | PRN
  Administered 2021-11-24: 100 mL via INTRAVENOUS

## 2021-12-05 DIAGNOSIS — R519 Headache, unspecified: Secondary | ICD-10-CM | POA: Diagnosis not present

## 2021-12-05 DIAGNOSIS — R4189 Other symptoms and signs involving cognitive functions and awareness: Secondary | ICD-10-CM | POA: Diagnosis not present

## 2021-12-05 DIAGNOSIS — G479 Sleep disorder, unspecified: Secondary | ICD-10-CM | POA: Diagnosis not present

## 2021-12-05 DIAGNOSIS — R569 Unspecified convulsions: Secondary | ICD-10-CM | POA: Diagnosis not present

## 2021-12-19 ENCOUNTER — Encounter (INDEPENDENT_AMBULATORY_CARE_PROVIDER_SITE_OTHER): Payer: Self-pay

## 2022-01-05 ENCOUNTER — Ambulatory Visit: Payer: Medicare Other | Attending: Cardiology | Admitting: Cardiology

## 2022-01-06 ENCOUNTER — Encounter: Payer: Self-pay | Admitting: Cardiology

## 2022-03-10 DIAGNOSIS — R0683 Snoring: Secondary | ICD-10-CM | POA: Diagnosis not present

## 2022-03-10 DIAGNOSIS — F411 Generalized anxiety disorder: Secondary | ICD-10-CM | POA: Diagnosis not present

## 2022-03-10 DIAGNOSIS — G4700 Insomnia, unspecified: Secondary | ICD-10-CM | POA: Diagnosis not present

## 2022-04-07 DIAGNOSIS — F411 Generalized anxiety disorder: Secondary | ICD-10-CM | POA: Diagnosis not present

## 2022-04-07 DIAGNOSIS — G4700 Insomnia, unspecified: Secondary | ICD-10-CM | POA: Diagnosis not present

## 2022-04-07 DIAGNOSIS — F4310 Post-traumatic stress disorder, unspecified: Secondary | ICD-10-CM | POA: Diagnosis not present

## 2022-05-05 DIAGNOSIS — F4310 Post-traumatic stress disorder, unspecified: Secondary | ICD-10-CM | POA: Diagnosis not present

## 2022-05-05 DIAGNOSIS — R4587 Impulsiveness: Secondary | ICD-10-CM | POA: Diagnosis not present

## 2022-05-05 DIAGNOSIS — F411 Generalized anxiety disorder: Secondary | ICD-10-CM | POA: Diagnosis not present

## 2022-06-02 DIAGNOSIS — F17210 Nicotine dependence, cigarettes, uncomplicated: Secondary | ICD-10-CM | POA: Diagnosis not present

## 2022-06-02 DIAGNOSIS — F419 Anxiety disorder, unspecified: Secondary | ICD-10-CM | POA: Diagnosis not present

## 2022-06-02 DIAGNOSIS — Z79899 Other long term (current) drug therapy: Secondary | ICD-10-CM | POA: Diagnosis not present

## 2022-06-02 DIAGNOSIS — F4310 Post-traumatic stress disorder, unspecified: Secondary | ICD-10-CM | POA: Diagnosis not present

## 2022-06-02 DIAGNOSIS — F319 Bipolar disorder, unspecified: Secondary | ICD-10-CM | POA: Diagnosis not present

## 2022-06-02 DIAGNOSIS — R519 Headache, unspecified: Secondary | ICD-10-CM | POA: Diagnosis not present

## 2022-06-02 DIAGNOSIS — F514 Sleep terrors [night terrors]: Secondary | ICD-10-CM | POA: Diagnosis not present

## 2022-06-02 DIAGNOSIS — G4700 Insomnia, unspecified: Secondary | ICD-10-CM | POA: Diagnosis not present

## 2022-06-02 DIAGNOSIS — R0683 Snoring: Secondary | ICD-10-CM | POA: Diagnosis not present

## 2022-06-11 DIAGNOSIS — L02214 Cutaneous abscess of groin: Secondary | ICD-10-CM | POA: Diagnosis not present

## 2022-06-30 DIAGNOSIS — F411 Generalized anxiety disorder: Secondary | ICD-10-CM | POA: Diagnosis not present

## 2022-06-30 DIAGNOSIS — G4700 Insomnia, unspecified: Secondary | ICD-10-CM | POA: Diagnosis not present

## 2022-08-03 ENCOUNTER — Ambulatory Visit: Payer: Medicare Other

## 2022-10-02 DIAGNOSIS — Z03818 Encounter for observation for suspected exposure to other biological agents ruled out: Secondary | ICD-10-CM | POA: Diagnosis not present

## 2022-10-02 DIAGNOSIS — J019 Acute sinusitis, unspecified: Secondary | ICD-10-CM | POA: Diagnosis not present

## 2022-10-02 DIAGNOSIS — U071 COVID-19: Secondary | ICD-10-CM | POA: Diagnosis not present

## 2022-10-02 DIAGNOSIS — J4520 Mild intermittent asthma, uncomplicated: Secondary | ICD-10-CM | POA: Diagnosis not present

## 2023-01-15 ENCOUNTER — Telehealth: Payer: Self-pay | Admitting: Family Medicine

## 2023-01-15 NOTE — Telephone Encounter (Signed)
Copied from CRM [id number]. Topic: Medicare AWV >> Jan 15, 2023  9:31 AM Payton Doughty wrote: Reason for CRM: Called LVM 01/15/2023 to schedule Annual Wellness Visit  Verlee Rossetti; Care Guide Ambulatory Clinical Support [place] [facility] Direct Dial: [phone]

## 2023-01-30 ENCOUNTER — Ambulatory Visit (INDEPENDENT_AMBULATORY_CARE_PROVIDER_SITE_OTHER): Payer: Medicare Other | Admitting: *Deleted

## 2023-01-30 VITALS — Ht 67.0 in | Wt 220.0 lb

## 2023-01-30 DIAGNOSIS — Z0000 Encounter for general adult medical examination without abnormal findings: Secondary | ICD-10-CM | POA: Diagnosis not present

## 2023-01-30 NOTE — Patient Instructions (Signed)
[name] , Thank you for taking time to come for your Medicare Wellness Visit. I appreciate your ongoing commitment to your health goals. Please review the following plan we discussed and let me know if I can assist you in the future.   Referrals/Orders/Follow-Ups/Clinician Recommendations: Make sure that you schedule an eye exam  This is a list of the screening recommended for you and due dates:  Health Maintenance  Topic Date Due   COVID-19 Vaccine (3 - Moderna risk series) 07/15/2019   Colon Cancer Screening  07/22/2020   Flu Shot  09/21/2022   Medicare Annual Wellness Visit  01/30/2024   DTaP/Tdap/Td vaccine (3 - Td or Tdap) 11/01/2027   Hepatitis C Screening  Completed   HIV Screening  Completed   HPV Vaccine  Aged Out    Advanced directives: (Declined) Advance directive discussed with you today. Even though you declined this today, please call our office should you change your mind, and we can give you the proper paperwork for you to fill out.  Next Medicare Annual Wellness Visit scheduled for next year: Yes 02/01/24 @ 10:10

## 2023-01-30 NOTE — Progress Notes (Signed)
Subjective:   Michael [name] is a 32 y.o. male who presents for Medicare Annual/Subsequent preventive examination.  Visit Complete: Virtual I connected with  Michael [name] on 01/30/23 by a audio enabled telemedicine application and verified that I am speaking with the correct person using two identifiers.  Patient Location: Home  Provider Location: Office/Clinic  I discussed the limitations of evaluation and management by telemedicine. The patient expressed understanding and agreed to proceed.  Vital Signs: Because this visit was a virtual/telehealth visit, some criteria may be missing or patient reported. Any vitals not documented were not able to be obtained and vitals that have been documented are patient reported.  Patient Medicare AWV questionnaire was completed by the patient on 01/29/23; I have confirmed that all information answered by patient is correct and no changes since this date.  Cardiac Risk Factors include: hypertension;male gender;smoking/ tobacco exposure;obesity (BMI >30kg/m2)     Objective:    Today's Vitals   01/30/23 0907 01/30/23 0909  Weight: 220 lb (99.8 kg)   Height: 5\' 7"  (1.702 m)   PainSc:  6    Body mass index is 34.46 kg/m.     01/30/2023    9:27 AM 09/21/2021   10:19 AM 08/01/2021   11:47 AM 07/20/2021    5:37 PM 07/20/2021    8:00 AM 07/20/2021   12:05 AM 05/24/2021    3:43 PM  Advanced Directives  Does Patient Have a Medical Advance Directive? No No No No No No No  Would patient like information on creating a medical advance directive? No - Patient declined No - Patient declined No - Patient declined No - Patient declined No - Patient declined No - Patient declined No - Patient declined    Current Medications (verified) Outpatient Encounter Medications as of 01/30/2023  Medication Sig   ARIPiprazole (ABILIFY) 10 MG tablet Take 10 mg by mouth every morning.   famotidine-calcium carbonate-magnesium hydroxide (PEPCID COMPLETE) 10-800-165 MG  chewable tablet Chew 1 tablet by mouth daily as needed (heartburn or indigestion).   prazosin (MINIPRESS) 2 MG capsule Take 2 mg by mouth at bedtime. (Take with 5mg  capsule to equal 7mg  total)   prazosin (MINIPRESS) 5 MG capsule Take 5 mg by mouth at bedtime. (Take with 2mg  capsule to equal 7mg  total)   Budeson-Glycopyrrol-Formoterol (BREZTRI AEROSPHERE) 160-9-4.8 MCG/ACT AERO Inhale 2 puffs into the lungs in the morning and at bedtime. (Patient not taking: Reported on 01/30/2023)   diazepam (VALIUM) 5 MG tablet Take 5 mg by mouth in the morning. 2.5 morning, 10mg  Evenings (Patient not taking: Reported on 01/30/2023)   lamoTRIgine (LAMICTAL) 25 MG tablet Take 25 mg by mouth as directed. Week 1: 1 tablet at night. Week 2: 1 tablet in the morning and 1 tablet at night. Week 3: 1 tablet in the morning and 2 tablets at night. Week 4: 2 tablets in the morning and 2 tablets at night. Week 5: 2 tablets in the morning and 3 tablets at night. Week 6: 3 tablets in the morning and 3 tablets at night. Week 7: 3 tablets in the morning and 4 tablets at night. Week 8: 4 tablets in the morning and 4 tablets at night. And continue this dose. (Patient not taking: Reported on 01/30/2023)   levETIRAcetam (KEPPRA) 500 MG tablet Take 1 tablet (500 mg total) by mouth 2 (two) times daily. (Patient not taking: Reported on 01/30/2023)   metoprolol tartrate (LOPRESSOR) 100 MG tablet Take 1 tablet (100 mg total) by mouth  once for 1 dose. Take 2 hours prior to your CT scan.   naproxen (NAPROSYN) 500 MG tablet Take 1 tablet (500 mg total) by mouth 2 (two) times daily as needed for moderate pain (take with food). (Patient not taking: Reported on 01/30/2023)   nortriptyline (PAMELOR) 10 MG capsule Take 10 mg by mouth at bedtime. (Patient not taking: Reported on 01/30/2023)   omeprazole (PRILOSEC) 40 MG capsule Take 1 capsule (40 mg total) by mouth daily. (Patient not taking: Reported on 01/30/2023)   QUEtiapine (SEROQUEL) 25 MG tablet  Take by mouth. (Patient not taking: Reported on 01/30/2023)   rizatriptan (MAXALT) 10 MG tablet Take 1 tablet (10 mg total) by mouth as needed for migraine (May repeat after 2 hours.  Maximum 2 tablets in 24 hours.). May repeat in 2 hours if needed (Patient not taking: Reported on 01/30/2023)   traZODone (DESYREL) 50 MG tablet Take 50 mg by mouth once a week. (Patient not taking: Reported on 01/30/2023)   No facility-administered encounter medications on file as of 01/30/2023.    Allergies (verified) Bee venom, Quetiapine, Serotonin reuptake inhibitors (ssris), Amoxicillin, Gabapentin, Prednisone, and Latuda [lurasidone hcl]   History: Past Medical History:  Diagnosis Date   Allergy    Anal fissure 04/06/2017   Anxiety    Asthma    Chest pain off and on   COVID-19    02/2020   Depression    Eosinophilia    Family history of adverse reaction to anesthesia    adopted history unknown   GERD (gastroesophageal reflux disease)    Pancreatitis last 2015   Substance abuse (HCC)    per pt heroin/cocaine age [age]    Past Surgical History:  Procedure Laterality Date   CHOLECYSTECTOMY  08/16/2015   Procedure: LAPAROSCOPIC CHOLECYSTECTOMY;  Surgeon: Ovidio Kin, MD;  Location: [facility];  Service: General;;   ESOPHAGOGASTRODUODENOSCOPY (EGD) WITH PROPOFOL N/A 07/27/2016   Procedure: ESOPHAGOGASTRODUODENOSCOPY (EGD) WITH PROPOFOL;  Surgeon: Rachael Fee, MD;  Location: [facility];  Service: Endoscopy;  Laterality: N/A;   ESOPHAGOGASTRODUODENOSCOPY (EGD) WITH PROPOFOL N/A 10/10/2017   Procedure: ESOPHAGOGASTRODUODENOSCOPY (EGD) WITH PROPOFOL;  Surgeon: Pasty Spillers, MD;  Location: [facility];  Service: Endoscopy;  Laterality: N/A;   TONSILLECTOMY AND ADENOIDECTOMY  as child   and adenoids   XI ROBOTIC LAPAROSCOPIC ASSISTED APPENDECTOMY N/A 04/29/2021   Procedure: XI ROBOTIC LAPAROSCOPIC ASSISTED APPENDECTOMY;  Surgeon: Sung Amabile, DO;  Location: [facility];  Service: General;   Laterality: N/A;   Family History  Adopted: Yes  Problem Relation Age of Onset   Diabetes Paternal Grandfather    Lung cancer Paternal Grandfather    Ulcers Father    Colon cancer Neg Hx    Social History   Socioeconomic History   Marital status: Single    Spouse name: Not on file   Number of children: 2   Years of education: 12   Highest education level: 12th grade  Occupational History    Comment: Games developer   Occupation: pressure washer  Tobacco Use   Smoking status: Every Day    Current packs/day: 1.50    Average packs/day: 1.5 packs/day for 15.0 years (22.5 ttl pk-yrs)    Types: Cigarettes   Smokeless tobacco: Former    Types: Chew    Quit date: 08/12/2017   Tobacco comments:    1 pack per day-09/19/2021  Vaping Use   Vaping status: Never Used  Substance and Sexual Activity   Alcohol use: Yes  Alcohol/week: 24.0 standard drinks of alcohol    Types: 24 Cans of beer per week    Comment: 24 per day   Drug use: Yes    Frequency: 14.0 times per week    Types: Marijuana    Comment: heroin, cocaine, acid, pain killers; last use yrs ago, marijuana daily use 08/13/18    Sexual activity: Not Currently    Partners: Female  Other Topics Concern   Not on file  Social History Narrative   Lives at home with parents.      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      Hobbies - computers, outdoors, fishing      Diet - regular      Exercise - none   Caffeine- coffee, 2 pots daily       H/o cocaine/heroin abuse 18 -23 y.o    smoker   Social Determinants of Health   Financial Resource Strain: Low Risk  (01/29/2023)   Overall Financial Resource Strain (CARDIA)    Difficulty of Paying Living Expenses: Not very hard  Food Insecurity: No Food Insecurity (01/29/2023)   Hunger Vital Sign    Worried About Running Out of Food in the Last Year: Never true     Ran Out of Food in the Last Year: Never true  Transportation Needs: No Transportation Needs (01/29/2023)   PRAPARE - Administrator, Civil Service (Medical): No    Lack of Transportation (Non-Medical): No  Physical Activity: Insufficiently Active (01/29/2023)   Exercise Vital Sign    Days of Exercise per Week: 2 days    Minutes of Exercise per Session: 20 min  Stress: Stress Concern Present (01/29/2023)   [facility] [name] of Occupational Health - Occupational Stress Questionnaire    Feeling of Stress : Rather much  Social Connections: Socially Isolated (01/29/2023)   Social Connection and Isolation Panel [NHANES]    Frequency of Communication with Friends and Family: Once a week    Frequency of Social Gatherings with Friends and Family: Twice a week    Attends Religious Services: Never    Database administrator or Organizations: No    Attends Engineer, structural: Never    Marital Status: Never married    Tobacco Counseling Ready to quit: Yes Counseling given: Yes Tobacco comments: 1 pack per day-09/19/2021   Clinical Intake:  Pre-visit preparation completed: Yes  Pain : 0-10 Pain Score: 6  Pain Type: Chronic pain Pain Location: Leg Pain Orientation: Right Pain Descriptors / Indicators: Pins and needles, Stabbing Pain Onset: More than a month ago     BMI - recorded: 34.46 Nutritional Status: BMI > 30  Obese Nutritional Risks: Nausea/ vomitting/ diarrhea (for years because of gallbladder removed) Diabetes: No  How often do you need to have someone help you when you read instructions, pamphlets, or other written materials from your doctor or pharmacy?: 1 - Never  Interpreter Needed?: No  Information entered by :: R. Demonta Wombles LPN   Activities of Daily Living    01/29/2023    8:02 PM  In your present state of health, do you have any difficulty performing the following activities:  Hearing? 0  Vision? 0  Difficulty concentrating or making decisions?  1  Walking or climbing stairs? 1  Dressing or bathing? 1  Doing errands, shopping? 1  Preparing Food and eating ? N  Using the Toilet? N  In the past six months, have you accidently leaked urine? N  Do you have problems with loss of bowel control? N  Managing your Medications? N  Managing your Finances? Y  Housekeeping or managing your Housekeeping? Y    Patient Care Team: Glori Luis, MD as PCP - General (Family Medicine) Debbe Odea, MD as PCP - Cardiology (Cardiology) Jeralyn Ruths, MD as Consulting Physician (Oncology) Rachael Fee, MD as Attending Physician (Gastroenterology)  Indicate any recent Medical Services you may have received from other than [facility] providers in the past year (date may be approximate).     Assessment:   This is a routine wellness examination for Michael.  Hearing/Vision screen Hearing Screening - Comments:: Ringing in ears  Vision Screening - Comments:: No glasses   Goals Addressed             This Visit's Progress    Patient Stated       Wants to quit smoking       Depression Screen    01/30/2023    9:20 AM 08/01/2021   11:49 AM 04/20/2021    1:35 PM 11/03/2020    8:12 AM 11/27/2019   12:58 PM 07/23/2019    8:55 AM 07/26/2018    1:59 PM  PHQ 2/9 Scores  PHQ - 2 Score 4 0 0 0  0 0  PHQ- 9 Score 15      0  Exception Documentation     Other- indicate reason in comment box    Not completed     Followed by psychiatry      Fall Risk    01/29/2023    8:02 PM 04/20/2021    1:35 PM 12/15/2020    1:25 PM 11/03/2020    8:11 AM 07/09/2020    1:05 PM  Fall Risk   Falls in the past year? 1 0 0 0 0  Number falls in past yr: 1 0 0 0 0  Injury with Fall? 0 0 0  0  Risk for fall due to : History of fall(s);Impaired balance/gait No Fall Risks     Follow up Falls evaluation completed;Falls prevention discussed Falls evaluation completed Falls evaluation completed Falls evaluation completed Falls evaluation completed     MEDICARE RISK AT HOME: Medicare Risk at Home Any stairs in or around the home?: Yes If so, are there any without handrails?: No Home free of loose throw rugs in walkways, pet beds, electrical cords, etc?: No Adequate lighting in your home to reduce risk of falls?: Yes Life alert?: No Use of a cane, [place] or w/c?: Yes (cane) Grab bars in the bathroom?: No Shower chair or bench in shower?: Yes Elevated toilet seat or a handicapped toilet?: No      Cognitive Function:        01/30/2023    9:27 AM  6CIT Screen  What Year? 0 points  What month? 0 points  What time? 0 points  Count back from 20 0 points  Months in reverse 2 points  Repeat phrase 2 points  Total Score 4 points    Immunizations Immunization History  Administered Date(s) Administered   Influenza,inj,Quad PF,6+ Mos 11/19/2015, 11/03/2020   Influenza-Unspecified 04/06/2018   Moderna Sars-Covid-2 Vaccination 05/16/2019, 06/17/2019   Tdap 11/19/2015, 10/31/2017    TDAP status: Up to date  Flu Vaccine status: Due, Education has been provided regarding the importance of this vaccine. Advised may  receive this vaccine at local pharmacy or Health Dept. Aware to provide a copy of the vaccination record if obtained from local pharmacy or Health Dept. Verbalized acceptance and understanding.   Covid-19 vaccine status: Information provided on how to obtain vaccines.   Qualifies for Shingles Vaccine? No   Zostavax completed No   Shingrix Completed?: No.    Education has been provided regarding the importance of this vaccine. Patient has been advised to call insurance company to determine out of pocket expense if they have not yet received this vaccine. Advised may also receive vaccine at local pharmacy or Health Dept. Verbalized acceptance and understanding.  Screening Tests Health Maintenance  Topic Date Due   COVID-19 Vaccine (3 - Moderna risk series) 07/15/2019   Colonoscopy  07/22/2020   Medicare Annual  Wellness (AWV)  08/02/2022   INFLUENZA VACCINE  09/21/2022   DTaP/Tdap/Td (3 - Td or Tdap) 11/01/2027   Hepatitis C Screening  Completed   HIV Screening  Completed   HPV VACCINES  Aged Out    Health Maintenance  Health Maintenance Due  Topic Date Due   COVID-19 Vaccine (3 - Moderna risk series) 07/15/2019   Colonoscopy  07/22/2020   Medicare Annual Wellness (AWV)  08/02/2022   INFLUENZA VACCINE  09/21/2022    Colorectal cancer screening: Type of screening: Colonoscopy. Completed 07/2015. Repeat every 5 years Will discuss with PCP at next visit  Lung Cancer Screening: (Low Dose CT Chest recommended if Age 77-80 years, 20 pack-year currently smoking OR have quit w/in 15years.) does not qualify. age    Additional Screening:  Hepatitis C Screening: does not qualify; Completed 06/2020  Vision Screening: Recommended annual ophthalmology exams for early detection of glaucoma and other disorders of the eye. Is the patient up to date with their annual eye exam?  No  Who is the provider or what is the name of the office in which the patient attends annual eye exams? Patient advised that he should get an eye exam If pt is not established with a provider, would they like to be referred to a provider to establish care? No .   Dental Screening: Recommended annual dental exams for proper oral hygiene   Community Resource Referral / Chronic Care Management: CRR required this visit?  No   CCM required this visit?  No     Plan:     I have personally reviewed and noted the following in the patient's chart:   Medical and social history Use of alcohol, tobacco or illicit drugs  Current medications and supplements including opioid prescriptions. Patient is not currently taking opioid prescriptions. Functional ability and status Nutritional status Physical activity Advanced directives List of other physicians Hospitalizations, surgeries, and ER visits in previous 12  months Vitals Screenings to include cognitive, depression, and falls Referrals and appointments  In addition, I have reviewed and discussed with patient certain preventive protocols, quality metrics, and best practice recommendations. A written personalized care plan for preventive services as well as general preventive health recommendations were provided to patient.     Sydell Axon, LPN   16/11/9602   After Visit Summary: (MyChart) Due to this being a telephonic visit, the after visit summary with patients personalized plan was offered to patient via MyChart   Nurse Notes: See routing comments

## 2023-02-01 DIAGNOSIS — J4489 Other specified chronic obstructive pulmonary disease: Secondary | ICD-10-CM | POA: Diagnosis not present

## 2023-02-01 DIAGNOSIS — J019 Acute sinusitis, unspecified: Secondary | ICD-10-CM | POA: Diagnosis not present

## 2023-02-01 DIAGNOSIS — R051 Acute cough: Secondary | ICD-10-CM | POA: Diagnosis not present

## 2023-02-01 DIAGNOSIS — J209 Acute bronchitis, unspecified: Secondary | ICD-10-CM | POA: Diagnosis not present

## 2023-02-01 DIAGNOSIS — R0989 Other specified symptoms and signs involving the circulatory and respiratory systems: Secondary | ICD-10-CM | POA: Diagnosis not present

## 2023-02-01 DIAGNOSIS — J45909 Unspecified asthma, uncomplicated: Secondary | ICD-10-CM | POA: Diagnosis not present

## 2023-02-01 DIAGNOSIS — J4520 Mild intermittent asthma, uncomplicated: Secondary | ICD-10-CM | POA: Diagnosis not present

## 2023-02-01 DIAGNOSIS — B9689 Other specified bacterial agents as the cause of diseases classified elsewhere: Secondary | ICD-10-CM | POA: Diagnosis not present

## 2023-02-07 ENCOUNTER — Ambulatory Visit: Payer: Medicare Other | Admitting: Family Medicine

## 2023-02-07 VITALS — BP 122/74 | HR 95 | Temp 98.1°F | Ht 67.0 in | Wt 218.4 lb

## 2023-02-07 DIAGNOSIS — Z13220 Encounter for screening for lipoid disorders: Secondary | ICD-10-CM

## 2023-02-07 DIAGNOSIS — R569 Unspecified convulsions: Secondary | ICD-10-CM

## 2023-02-07 DIAGNOSIS — F1090 Alcohol use, unspecified, uncomplicated: Secondary | ICD-10-CM

## 2023-02-07 DIAGNOSIS — F5101 Primary insomnia: Secondary | ICD-10-CM | POA: Diagnosis not present

## 2023-02-07 DIAGNOSIS — R7309 Other abnormal glucose: Secondary | ICD-10-CM | POA: Diagnosis not present

## 2023-02-07 DIAGNOSIS — R1011 Right upper quadrant pain: Secondary | ICD-10-CM | POA: Diagnosis not present

## 2023-02-07 DIAGNOSIS — Z23 Encounter for immunization: Secondary | ICD-10-CM

## 2023-02-07 DIAGNOSIS — F17210 Nicotine dependence, cigarettes, uncomplicated: Secondary | ICD-10-CM | POA: Insufficient documentation

## 2023-02-07 DIAGNOSIS — J069 Acute upper respiratory infection, unspecified: Secondary | ICD-10-CM | POA: Diagnosis not present

## 2023-02-07 DIAGNOSIS — H9312 Tinnitus, left ear: Secondary | ICD-10-CM | POA: Diagnosis not present

## 2023-02-07 DIAGNOSIS — G4700 Insomnia, unspecified: Secondary | ICD-10-CM | POA: Insufficient documentation

## 2023-02-07 DIAGNOSIS — R490 Dysphonia: Secondary | ICD-10-CM

## 2023-02-07 DIAGNOSIS — H9319 Tinnitus, unspecified ear: Secondary | ICD-10-CM | POA: Insufficient documentation

## 2023-02-07 DIAGNOSIS — F419 Anxiety disorder, unspecified: Secondary | ICD-10-CM

## 2023-02-07 DIAGNOSIS — K635 Polyp of colon: Secondary | ICD-10-CM

## 2023-02-07 MED ORDER — TRAZODONE HCL 50 MG PO TABS: 25.0000 mg | ORAL_TABLET | Freq: Every evening | ORAL | 3 refills | Status: DC | PRN

## 2023-02-07 MED ORDER — AZELASTINE HCL 0.1 % NA SOLN: 2.0000 | Freq: Two times a day (BID) | NASAL | 1 refills | Status: DC

## 2023-02-07 NOTE — Assessment & Plan Note (Signed)
Referred to GI.  Due for colonoscopy.

## 2023-02-07 NOTE — Assessment & Plan Note (Signed)
Abdominal symptoms have significantly improved at this time.  Discussed avoiding alcohol intake.

## 2023-02-07 NOTE — Assessment & Plan Note (Signed)
Chronic issue.  Reports no anxiety or depression.  Patient will monitor for recurrent symptoms.

## 2023-02-07 NOTE — Progress Notes (Signed)
Michael Alar, MD Phone: [phone]  Michael [name] is a 32 y.o. male who presents today for follow-up.  Anxiety/depression: Patient notes these things have improved.  He got off Valium and his psychiatric issues improved significantly.  He is no longer on medication for this.  He notes he has essentially cut alcohol out.  Last alcoholic beverage was 2 weeks ago when he drank half a beer and had to stop due to discomfort in his stomach from the alcohol.  He notes no abdominal pain at any other times.  He notes last seizure episode was greater than a year ago and notes his neurologist okayed him coming off of the seizure medication.    Tobacco abuse: Patient notes he is down to 2 packs/day.  He notes a pharmacist told him not to mix nicotine patches and gum.  Hoarseness: Patient notes hoarseness and raspy voice for a few months.  He is had some sore throat with this.  He notes his reflux is well-controlled.  No postnasal drip.  Difficulty sleeping: Chronic issue.  Patient reports getting about 5 hours of sleep nightly.  Goes to bed between 10 and 11 PM and wakes up between 3 and 4 AM.  No screens before bed.  No alcohol intake.  No caffeine after 4 PM.  Has tried melatonin with minimal benefit.  Notes he was on trazodone 8+ years ago and does not think it helped a whole lot.  Respiratory illness: Patient reports symptoms have improved to a certain degree.  Sinus pressure is better.  Does still have some tinnitus in his left ear.  Was treated with doxycycline.  Patient reports his prior chronic diarrhea has resolved.  Alcohol use disorder: Patient notes he went to [facility] for detox previously.  He had an alcohol withdrawal seizure during that time though has not had any additional alcohol withdrawal seizures.  Social History   Tobacco Use  Smoking Status Every Day   Current packs/day: 1.50   Average packs/day: 1.5 packs/day for 15.0 years (22.5 ttl pk-yrs)   Types: Cigarettes   Smokeless Tobacco Former   Types: Chew   Quit date: 08/12/2017  Tobacco Comments   1 pack per day-09/19/2021    Current Outpatient Medications on File Prior to Visit  Medication Sig Dispense Refill   famotidine-calcium carbonate-magnesium hydroxide (PEPCID COMPLETE) 10-800-165 MG chewable tablet Chew 1 tablet by mouth daily as needed (heartburn or indigestion).     prazosin (MINIPRESS) 2 MG capsule Take 2 mg by mouth at bedtime. (Take with 5mg  capsule to equal 7mg  total)     prazosin (MINIPRESS) 5 MG capsule Take 5 mg by mouth at bedtime. (Take with 2mg  capsule to equal 7mg  total)     QUEtiapine (SEROQUEL) 25 MG tablet Take by mouth. (Patient not taking: Reported on 01/30/2023)     rizatriptan (MAXALT) 10 MG tablet Take 1 tablet (10 mg total) by mouth as needed for migraine (May repeat after 2 hours.  Maximum 2 tablets in 24 hours.). May repeat in 2 hours if needed (Patient not taking: Reported on 01/30/2023) 10 tablet 5   No current facility-administered medications on file prior to visit.     ROS see history of present illness  Objective  Physical Exam Vitals:   02/07/23 1441  BP: 122/74  Pulse: 95  Temp: 98.1 F (36.7 C)  SpO2: 96%    BP Readings from Last 3 Encounters:  02/07/23 122/74  11/24/21 135/71  11/17/21 126/86   Wt Readings from  Last 3 Encounters:  02/07/23 218 lb 6.4 oz (99.1 kg)  01/30/23 220 lb (99.8 kg)  11/17/21 227 lb (103 kg)    Physical Exam Constitutional:      General: He is not in acute distress.    Appearance: He is not diaphoretic.  Cardiovascular:     Rate and Rhythm: Normal rate and regular rhythm.     Heart sounds: Normal heart sounds.  Pulmonary:     Effort: Pulmonary effort is normal.     Breath sounds: Normal breath sounds.  Skin:    General: Skin is warm and dry.  Neurological:     Mental Status: He is alert.      Assessment/Plan: Please see individual problem list.  Polyp of colon, unspecified part of colon,  unspecified type Assessment & Plan: Referred to GI.  Due for colonoscopy.  Orders: -     Ambulatory referral to Gastroenterology  Right upper quadrant abdominal pain Assessment & Plan: Abdominal symptoms have significantly improved at this time.  Discussed avoiding alcohol intake.   Anxiety Assessment & Plan: Chronic issue.  Reports no anxiety or depression.  Patient will monitor for recurrent symptoms.   Alcohol use disorder Assessment & Plan: Chronic issue.  Encouraged continued cessation of alcohol intake.   Seizure [facility]) Assessment & Plan: No recurrence in over a year.  Patient reports neurology advised he could come off of his medications.  He will monitor for recurrence.   Hoarseness Assessment & Plan: Refer to ENT.  Orders: -     Ambulatory referral to ENT  Tinnitus of left ear Assessment & Plan: Possibly related to recent infection.  Discussed a trial of Flonase to see if that would help with any inflammation that could be contributing to eustachian tube dysfunction.  We will refer to ENT as well.  Orders: -     Ambulatory referral to ENT -     Azelastine HCl; Place 2 sprays into both nostrils 2 (two) times daily. Use in each nostril as directed  Dispense: 30 mL; Refill: 1  Primary insomnia Assessment & Plan: Chronic issue.  Encouraged good sleep hygiene measures including stable bedtime and wake time.  Discussed minimizing caffeine after 1 PM.  Will trial trazodone 25 to 50 mg nightly as needed for sleep.  If this is not helpful he will let me know and we can trial an increased dose of this. Patient will monitor for drowsiness with this.  Orders: -     traZODone HCl; Take 0.5-1 tablets (25-50 mg total) by mouth at bedtime as needed for sleep.  Dispense: 30 tablet; Refill: 3  Nicotine dependence, cigarettes, uncomplicated Assessment & Plan: Encourage smoking cessation.  Discussed patient could trial nicotine patches in combination with gum over-the-counter.   Advised if he had any adverse effects from using both of these he can stop the gum.   Viral URI Assessment & Plan: Improving.  He will monitor for worsening symptoms.   Lipid screening -     Comprehensive metabolic panel -     Lipid panel  Elevated glucose -     Hemoglobin A1c     Return in about 6 months (around 08/08/2023) for transfer care.   Michael Alar, MD [facility]

## 2023-02-07 NOTE — Assessment & Plan Note (Addendum)
Encourage smoking cessation.  Discussed patient could trial nicotine patches in combination with gum over-the-counter.  Advised if he had any adverse effects from using both of these he can stop the gum.

## 2023-02-07 NOTE — Assessment & Plan Note (Signed)
Chronic issue.  Encouraged continued cessation of alcohol intake.

## 2023-02-07 NOTE — Assessment & Plan Note (Signed)
Possibly related to recent infection.  Discussed a trial of Flonase to see if that would help with any inflammation that could be contributing to eustachian tube dysfunction.  We will refer to ENT as well.

## 2023-02-07 NOTE — Assessment & Plan Note (Signed)
Refer to ENT

## 2023-02-07 NOTE — Assessment & Plan Note (Signed)
Improving.  He will monitor for worsening symptoms.

## 2023-02-07 NOTE — Assessment & Plan Note (Addendum)
Chronic issue.  Encouraged good sleep hygiene measures including stable bedtime and wake time.  Discussed minimizing caffeine after 1 PM.  Will trial trazodone 25 to 50 mg nightly as needed for sleep.  If this is not helpful he will let me know and we can trial an increased dose of this. Patient will monitor for drowsiness with this.

## 2023-02-07 NOTE — Assessment & Plan Note (Signed)
No recurrence in over a year.  Patient reports neurology advised he could come off of his medications.  He will monitor for recurrence.

## 2023-02-08 LAB — COMPREHENSIVE METABOLIC PANEL
ALT: 25 U/L (ref 0–53)
AST: 15 U/L (ref 0–37)
Albumin: 4.4 g/dL (ref 3.5–5.2)
Alkaline Phosphatase: 66 U/L (ref 39–117)
BUN: 11 mg/dL (ref 6–23)
CO2: 25 meq/L (ref 19–32)
Calcium: 9.1 mg/dL (ref 8.4–10.5)
Chloride: 104 meq/L (ref 96–112)
Creatinine, Ser: 1.05 mg/dL (ref 0.40–1.50)
GFR: 94.21 mL/min (ref >60.00)
Glucose, Bld: 103 mg/dL — ABNORMAL HIGH (ref 70–99)
Potassium: 4 meq/L (ref 3.5–5.1)
Sodium: 138 meq/L (ref 135–145)
Total Bilirubin: 0.4 mg/dL (ref 0.2–1.2)
Total Protein: 6.9 g/dL (ref 6.0–8.3)

## 2023-02-08 LAB — LIPID PANEL
Cholesterol: 239 mg/dL — ABNORMAL HIGH (ref 0–200)
HDL: 33.3 mg/dL — ABNORMAL LOW (ref >39.00)
LDL Cholesterol: 137 mg/dL — ABNORMAL HIGH (ref 0–99)
NonHDL: 205.51
Total CHOL/HDL Ratio: 7
Triglycerides: 344 mg/dL — ABNORMAL HIGH (ref 0.0–149.0)
VLDL: 68.8 mg/dL — ABNORMAL HIGH (ref 0.0–40.0)

## 2023-02-08 LAB — HEMOGLOBIN A1C: Hgb A1c MFr Bld: 5.8 % (ref 4.6–6.5)

## 2023-02-27 DIAGNOSIS — J3489 Other specified disorders of nose and nasal sinuses: Secondary | ICD-10-CM | POA: Diagnosis not present

## 2023-02-27 DIAGNOSIS — R490 Dysphonia: Secondary | ICD-10-CM | POA: Diagnosis not present

## 2023-02-27 DIAGNOSIS — H90A21 Sensorineural hearing loss, unilateral, right ear, with restricted hearing on the contralateral side: Secondary | ICD-10-CM | POA: Diagnosis not present

## 2023-02-27 DIAGNOSIS — H6983 Other specified disorders of Eustachian tube, bilateral: Secondary | ICD-10-CM | POA: Diagnosis not present

## 2023-03-05 ENCOUNTER — Telehealth: Payer: Self-pay

## 2023-03-05 ENCOUNTER — Other Ambulatory Visit: Payer: Self-pay

## 2023-03-05 DIAGNOSIS — Z860100 Personal history of colon polyps, unspecified: Secondary | ICD-10-CM

## 2023-03-05 MED ORDER — NA SULFATE-K SULFATE-MG SULF 17.5-3.13-1.6 GM/177ML PO SOLN: 354.0000 mL | Freq: Once | ORAL | 0 refills | Status: AC

## 2023-03-05 NOTE — Telephone Encounter (Signed)
 Gastroenterology Pre-Procedure Review  Request Date: 03/27/2023 Requesting Physician: Dr. Therisa  PATIENT REVIEW QUESTIONS: The patient responded to the following health history questions as indicated:    1. Are you having any GI issues? Abdominal pain but has had that since having his gallbladder removed. He had that removed in 2017.  2. Do you have a personal history of Polyps? Yes had colonoscopy and had polyps removed  3. Do you have a family history of Colon Cancer or Polyps? no he is adopted so does not know his real  parents  4. Diabetes Mellitus? no 5. Joint replacements in the past 12 months?no 6. Major health problems in the past 3 months?no 7. Any artificial heart valves, MVP, or defibrillator?no    MEDICATIONS & ALLERGIES:    Patient reports the following regarding taking any anticoagulation/antiplatelet therapy:   Plavix, Coumadin, Eliquis, Xarelto, Lovenox , Pradaxa, Brilinta, or Effient? no Aspirin? no  Patient confirms/reports the following medications:  Current Outpatient Medications  Medication Sig Dispense Refill   azelastine  (ASTELIN ) 0.1 % nasal spray Place 2 sprays into both nostrils 2 (two) times daily. Use in each nostril as directed 30 mL 1   famotidine -calcium  carbonate-magnesium hydroxide (PEPCID  COMPLETE) 10-800-165 MG chewable tablet Chew 1 tablet by mouth daily as needed (heartburn or indigestion).     traZODone  (DESYREL ) 50 MG tablet Take 0.5-1 tablets (25-50 mg total) by mouth at bedtime as needed for sleep. 30 tablet 3   No current facility-administered medications for this visit.    Patient confirms/reports the following allergies:  Allergies  Allergen Reactions   Bee Venom Anaphylaxis   Quetiapine Rash   Serotonin Reuptake Inhibitors (Ssris) Rash   Amoxicillin  Diarrhea    Has patient had a PCN reaction causing immediate rash, facial/tongue/throat swelling, SOB or lightheadedness with hypotension: No Has patient had a PCN reaction causing severe  rash involving mucus membranes or skin necrosis: No Has patient had a PCN reaction that required hospitalization: No Has patient had a PCN reaction occurring within the last 10 years: Yes If all of the above answers are NO, then may proceed with Cephalosporin use.    Gabapentin Nausea And Vomiting    diarrhea   Prednisone Other (See Comments)    Difficulty breathing   Latuda [Lurasidone Hcl] Rash and Nausea And Vomiting    No orders of the defined types were placed in this encounter.   AUTHORIZATION INFORMATION Primary Insurance: 1D#: Group #:  Secondary Insurance: 1D#: Group #:  SCHEDULE INFORMATION: Date:  Time: Location:

## 2023-03-20 DIAGNOSIS — H903 Sensorineural hearing loss, bilateral: Secondary | ICD-10-CM | POA: Diagnosis not present

## 2023-03-20 DIAGNOSIS — K219 Gastro-esophageal reflux disease without esophagitis: Secondary | ICD-10-CM | POA: Diagnosis not present

## 2023-03-21 ENCOUNTER — Other Ambulatory Visit: Payer: Self-pay | Admitting: Otolaryngology

## 2023-03-21 DIAGNOSIS — H9041 Sensorineural hearing loss, unilateral, right ear, with unrestricted hearing on the contralateral side: Secondary | ICD-10-CM

## 2023-03-27 ENCOUNTER — Encounter: Payer: Self-pay | Admitting: Gastroenterology

## 2023-03-27 ENCOUNTER — Ambulatory Visit: Payer: Medicare Other | Admitting: Anesthesiology

## 2023-03-27 ENCOUNTER — Ambulatory Visit
Admission: RE | Admit: 2023-03-27 | Discharge: 2023-03-27 | Disposition: A | Discharge status: Home / Self Care | Payer: Medicare Other | Attending: Gastroenterology | Admitting: Gastroenterology

## 2023-03-27 ENCOUNTER — Encounter
Admission: RE | Disposition: A | Discharge status: Home / Self Care | Payer: Self-pay | Source: Home / Self Care | Attending: Gastroenterology

## 2023-03-27 DIAGNOSIS — Z1211 Encounter for screening for malignant neoplasm of colon: Secondary | ICD-10-CM | POA: Insufficient documentation

## 2023-03-27 DIAGNOSIS — D123 Benign neoplasm of transverse colon: Secondary | ICD-10-CM | POA: Diagnosis not present

## 2023-03-27 DIAGNOSIS — Z860100 Personal history of colon polyps, unspecified: Secondary | ICD-10-CM | POA: Diagnosis not present

## 2023-03-27 DIAGNOSIS — F419 Anxiety disorder, unspecified: Secondary | ICD-10-CM | POA: Diagnosis not present

## 2023-03-27 DIAGNOSIS — F32A Depression, unspecified: Secondary | ICD-10-CM | POA: Insufficient documentation

## 2023-03-27 DIAGNOSIS — K449 Diaphragmatic hernia without obstruction or gangrene: Secondary | ICD-10-CM | POA: Insufficient documentation

## 2023-03-27 DIAGNOSIS — F17210 Nicotine dependence, cigarettes, uncomplicated: Secondary | ICD-10-CM | POA: Insufficient documentation

## 2023-03-27 DIAGNOSIS — K219 Gastro-esophageal reflux disease without esophagitis: Secondary | ICD-10-CM | POA: Diagnosis not present

## 2023-03-27 DIAGNOSIS — K635 Polyp of colon: Secondary | ICD-10-CM | POA: Diagnosis not present

## 2023-03-27 HISTORY — PX: COLONOSCOPY WITH PROPOFOL: SHX5780

## 2023-03-27 HISTORY — PX: POLYPECTOMY: SHX5525

## 2023-03-27 SURGERY — COLONOSCOPY WITH PROPOFOL
Anesthesia: General

## 2023-03-27 MED ORDER — SODIUM CHLORIDE 0.9 % IV SOLN: INTRAVENOUS | Status: DC

## 2023-03-27 MED ORDER — PROPOFOL 10 MG/ML IV BOLUS
INTRAVENOUS | Status: DC | PRN
  Administered 2023-03-27: 50 mg via INTRAVENOUS
  Administered 2023-03-27: 100 mg via INTRAVENOUS

## 2023-03-27 MED ORDER — STERILE WATER FOR IRRIGATION IR SOLN
Status: DC | PRN
  Administered 2023-03-27: 60 mL

## 2023-03-27 MED ORDER — ONDANSETRON HCL 4 MG/2ML IJ SOLN
4.0000 mg | Freq: Once | INTRAMUSCULAR | Status: AC
Start: 2023-03-27 — End: 2023-03-27
  Administered 2023-03-27: 4 mg via INTRAVENOUS

## 2023-03-27 MED ORDER — ONDANSETRON HCL 4 MG/2ML IJ SOLN
INTRAMUSCULAR | Status: AC
  Filled 2023-03-27: qty 2

## 2023-03-27 MED ORDER — PROPOFOL 500 MG/50ML IV EMUL
INTRAVENOUS | Status: DC | PRN
  Administered 2023-03-27: 180 ug/kg/min via INTRAVENOUS

## 2023-03-27 NOTE — Anesthesia Preprocedure Evaluation (Signed)
Anesthesia Evaluation  Patient identified by MRN, date of birth, ID band Patient awake    Reviewed: Allergy & Precautions, NPO status , Patient's Chart, lab work & pertinent test results  History of Anesthesia Complications (+) PONV and history of anesthetic complications  Airway Mallampati: III  TM Distance: >3 FB Neck ROM: full    Dental no notable dental hx.    Pulmonary asthma , Current Smoker   Pulmonary exam normal        Cardiovascular Normal cardiovascular exam     Neuro/Psych  PSYCHIATRIC DISORDERS Anxiety Depression    negative neurological ROS     GI/Hepatic hiatal hernia,GERD  ,,(+)     substance abuse  alcohol use  Endo/Other  negative endocrine ROS    Renal/GU negative Renal ROS  negative genitourinary   Musculoskeletal   Abdominal   Peds  Hematology negative hematology ROS (+)   Anesthesia Other Findings Past Medical History: No date: Allergy 04/06/2017: Anal fissure No date: Anxiety No date: Asthma off and on: Chest pain No date: COVID-19     Comment:  02/2020 No date: Depression No date: Eosinophilia No date: Family history of adverse reaction to anesthesia     Comment:  adopted history unknown No date: GERD (gastroesophageal reflux disease) last 2015: Pancreatitis 05/09/2021: S/P appendectomy No date: Substance abuse (HCC)     Comment:  per pt heroin/cocaine age [age]   Past Surgical History: 08/16/2015: CHOLECYSTECTOMY     Comment:  Procedure: LAPAROSCOPIC CHOLECYSTECTOMY;  Surgeon: Ovidio Kin, MD;  Location: [facility] ORS;  Service: General;; 07/27/2016: ESOPHAGOGASTRODUODENOSCOPY (EGD) WITH PROPOFOL; N/A     Comment:  Procedure: ESOPHAGOGASTRODUODENOSCOPY (EGD) WITH               PROPOFOL;  Surgeon: Rachael Fee, MD;  Location: [facility]               ENDOSCOPY;  Service: Endoscopy;  Laterality: N/A; 10/10/2017: ESOPHAGOGASTRODUODENOSCOPY (EGD) WITH PROPOFOL; N/A     Comment:   Procedure: ESOPHAGOGASTRODUODENOSCOPY (EGD) WITH               PROPOFOL;  Surgeon: Pasty Spillers, MD;  Location:               [facility] ENDOSCOPY;  Service: Endoscopy;  Laterality: N/A; as child: TONSILLECTOMY AND ADENOIDECTOMY     Comment:  and adenoids 04/29/2021: XI ROBOTIC LAPAROSCOPIC ASSISTED APPENDECTOMY; N/A     Comment:  Procedure: XI ROBOTIC LAPAROSCOPIC ASSISTED               APPENDECTOMY;  Surgeon: Sung Amabile, DO;  Location: [facility]              ORS;  Service: General;  Laterality: N/A;  BMI    Body Mass Index: 32.26 kg/m      Reproductive/Obstetrics negative OB ROS                             Anesthesia Physical Anesthesia Plan  ASA: 2  Anesthesia Plan: General   Post-op Pain Management: Minimal or no pain anticipated   Induction: Intravenous  PONV Risk Score and Plan: 2 and Propofol infusion and TIVA  Airway Management Planned: Natural Airway and Nasal Cannula  Additional Equipment:   Intra-op Plan:   Post-operative Plan:   Informed Consent: I have reviewed the patients History and Physical, chart, labs and discussed the procedure  including the risks, benefits and alternatives for the proposed anesthesia with the patient or authorized representative who has indicated his/her understanding and acceptance.     Dental Advisory Given  Plan Discussed with: Anesthesiologist, CRNA and Surgeon  Anesthesia Plan Comments: (Patient consented for risks of anesthesia including but not limited to:  - adverse reactions to medications - risk of airway placement if required - damage to eyes, teeth, lips or other oral mucosa - nerve damage due to positioning  - sore throat or hoarseness - Damage to heart, brain, nerves, lungs, other parts of body or loss of life  Patient voiced understanding and assent.)       Anesthesia Quick Evaluation

## 2023-03-27 NOTE — Anesthesia Postprocedure Evaluation (Signed)
 Anesthesia Post Note  Patient: Michael [name]  Procedure(s) Performed: COLONOSCOPY WITH PROPOFOL  POLYPECTOMY  Patient location during evaluation: Endoscopy Anesthesia Type: General Level of consciousness: awake and alert Pain management: pain level controlled Vital Signs Assessment: post-procedure vital signs reviewed and stable Respiratory status: spontaneous breathing, nonlabored ventilation, respiratory function stable and patient connected to nasal cannula oxygen Cardiovascular status: blood pressure returned to baseline and stable Postop Assessment: no apparent nausea or vomiting Anesthetic complications: no   No notable events documented.   Last Vitals:  Vitals:   03/27/23 1130 03/27/23 1131  BP:  121/82  Pulse:    Resp: 19 17  Temp:    SpO2:      Last Pain:  Vitals:   03/27/23 1100  TempSrc: Temporal  PainSc:                  Lendia LITTIE Mae

## 2023-03-27 NOTE — Transfer of Care (Signed)
 Immediate Anesthesia Transfer of Care Note  Patient: Michael [name]  Procedure(s) Performed: COLONOSCOPY WITH PROPOFOL  POLYPECTOMY  Patient Location: PACU  Anesthesia Type:General  Level of Consciousness: awake, alert , and oriented  Airway & Oxygen Therapy: Patient Spontanous Breathing  Post-op Assessment: Report given to RN and Post -op Vital signs reviewed and stable  Post vital signs: Reviewed and stable  Last Vitals:  Vitals Value Taken Time  BP    Temp    Pulse 89 03/27/23 1112  Resp 19 03/27/23 1112  SpO2 95 % 03/27/23 1112  Vitals shown include unfiled device data.  Last Pain:  Vitals:   03/27/23 1100  TempSrc: Temporal  PainSc:          Complications: No notable events documented.

## 2023-03-27 NOTE — Op Note (Signed)
 [facility] Gastroenterology Patient Name: Michael [name] Procedure Date: 03/27/2023 10:23 AM MRN: [id number] Account #: [id number] Date of Birth: September 01, 1990 Admit Type: Outpatient Age: 33 Room: [facility] ENDO ROOM 2 Gender: Male Note Status: Finalized Instrument Name: Arvis [id number] Procedure:             Colonoscopy Indications:           Surveillance: Personal history of adenomatous polyps                         on last colonoscopy 5 years ago, Last colonoscopy:                         June 2017 Providers:             Ruel Kung MD, MD Referring MD:          Camellia MATSU. Maribeth (Referring MD) Medicines:             Monitored Anesthesia Care Complications:         No immediate complications. Procedure:             Pre-Anesthesia Assessment:                        - Prior to the procedure, a History and Physical was                         performed, and patient medications, allergies and                         sensitivities were reviewed. The patient's tolerance                         of previous anesthesia was reviewed.                        - The risks and benefits of the procedure and the                         sedation options and risks were discussed with the                         patient. All questions were answered and informed                         consent was obtained.                        - ASA Grade Assessment: II - A patient with mild                         systemic disease.                        After obtaining informed consent, the colonoscope was                         passed under direct vision. Throughout the procedure,                         the patient's blood pressure,  pulse, and oxygen                         saturations were monitored continuously. The                         Colonoscope was introduced through the anus and                         advanced to the the cecum, identified by the                         appendiceal  orifice. The colonoscopy was performed                         with ease. The patient tolerated the procedure well.                         The quality of the bowel preparation was excellent.                         Anatomical landmarks were photographed. Findings:      The perianal and digital rectal examinations were normal.      A 5 mm polyp was found in the transverse colon. The polyp was sessile.       The polyp was removed with a cold snare. Resection and retrieval were       complete.      The exam was otherwise without abnormality on direct and retroflexion       views. Impression:            - One 5 mm polyp in the transverse colon, removed with                         a cold snare. Resected and retrieved.                        - The examination was otherwise normal on direct and                         retroflexion views. Recommendation:        - Discharge patient to home (with escort).                        - Resume previous diet.                        - Continue present medications.                        - Await pathology results.                        - Repeat colonoscopy in 5 years for surveillance. Procedure Code(s):     --- Professional ---                        [phone], Colonoscopy, flexible; with removal of                         tumor(s), polyp(s),  or other lesion(s) by snare                         technique Diagnosis Code(s):     --- Professional ---                        Z86.010, Personal history of colonic polyps                        D12.3, Benign neoplasm of transverse colon (hepatic                         flexure or splenic flexure) CPT copyright 2022 American Medical Association. All rights reserved. The codes documented in this report are preliminary and upon coder review may  be revised to meet current compliance requirements. Ruel Kung, MD Ruel Kung MD, MD 03/27/2023 11:07:35 AM This report has been signed electronically. Number of Addenda:  0 Note Initiated On: 03/27/2023 10:23 AM Scope Withdrawal Time: 0 hours 8 minutes 55 seconds  Total Procedure Duration: 0 hours 11 minutes 15 seconds  Estimated Blood Loss:  Estimated blood loss: none.      [facility]

## 2023-03-27 NOTE — Care Plan (Signed)
Patient s/p procedure Post-op c/o abd discomfort Dr Tobi Bastos into assess patient - patient repositioned Post-op c/o nausea see MAR for full details Father at bedside Patient aware and alert declined PO  AVSS

## 2023-03-27 NOTE — H&P (Signed)
Wyline Mood, MD [address], [place], [place], [address] [address], [place], [place], [address] Phone: [phone]  Fax: [phone]  Primary Care Physician:  Glori Luis, MD   Pre-Procedure History & Physical: HPI:  [name] Stgermaine is a 33 y.o. male is here for an colonoscopy.   Past Medical History:  Diagnosis Date   Allergy    Anal fissure 04/06/2017   Anxiety    Asthma    Chest pain off and on   COVID-19    02/2020   Depression    Eosinophilia    Family history of adverse reaction to anesthesia    adopted history unknown   GERD (gastroesophageal reflux disease)    Pancreatitis last 2015   S/P appendectomy 05/09/2021   Substance abuse (HCC)    per pt heroin/cocaine age [age]     Past Surgical History:  Procedure Laterality Date   CHOLECYSTECTOMY  08/16/2015   Procedure: LAPAROSCOPIC CHOLECYSTECTOMY;  Surgeon: Ovidio Kin, MD;  Location: [facility] ORS;  Service: General;;   ESOPHAGOGASTRODUODENOSCOPY (EGD) WITH PROPOFOL N/A 07/27/2016   Procedure: ESOPHAGOGASTRODUODENOSCOPY (EGD) WITH PROPOFOL;  Surgeon: Rachael Fee, MD;  Location: [facility] ENDOSCOPY;  Service: Endoscopy;  Laterality: N/A;   ESOPHAGOGASTRODUODENOSCOPY (EGD) WITH PROPOFOL N/A 10/10/2017   Procedure: ESOPHAGOGASTRODUODENOSCOPY (EGD) WITH PROPOFOL;  Surgeon: Pasty Spillers, MD;  Location: [facility] ENDOSCOPY;  Service: Endoscopy;  Laterality: N/A;   TONSILLECTOMY AND ADENOIDECTOMY  as child   and adenoids   XI ROBOTIC LAPAROSCOPIC ASSISTED APPENDECTOMY N/A 04/29/2021   Procedure: XI ROBOTIC LAPAROSCOPIC ASSISTED APPENDECTOMY;  Surgeon: Sung Amabile, DO;  Location: [facility] ORS;  Service: General;  Laterality: N/A;    Prior to Admission medications   Medication Sig Start Date End Date Taking? Authorizing Provider  azelastine (ASTELIN) 0.1 % nasal spray Place 2 sprays into both nostrils 2 (two) times daily. Use in each nostril as directed 02/07/23   Glori Luis, MD   famotidine-calcium carbonate-magnesium hydroxide (PEPCID COMPLETE) 10-800-165 MG chewable tablet Chew 1 tablet by mouth daily as needed (heartburn or indigestion).    [provider]  traZODone (DESYREL) 50 MG tablet Take 0.5-1 tablets (25-50 mg total) by mouth at bedtime as needed for sleep. 02/07/23   Glori Luis, MD    Allergies as of 03/05/2023 - Review Complete 02/07/2023  Allergen Reaction Noted   Bee venom Anaphylaxis 06/14/2015   Quetiapine Rash 06/21/2015   Serotonin reuptake inhibitors (ssris) Rash 10/09/2014   Amoxicillin Diarrhea 05/04/2014   Gabapentin Nausea And Vomiting 10/09/2014   Prednisone Other (See Comments) 10/09/2014   Kasandra Knudsen [lurasidone hcl] Rash and Nausea And Vomiting 10/09/2014    Family History  Adopted: Yes  Problem Relation Age of Onset   Diabetes Paternal Grandfather    Lung cancer Paternal Grandfather    Ulcers Father    Colon cancer Neg Hx     Social History   Socioeconomic History   Marital status: Single    Spouse name: Not on file   Number of children: 2   Years of education: 12   Highest education level: 12th grade  Occupational History    Comment: Games developer   Occupation: pressure washer  Tobacco Use   Smoking status: Every Day    Current packs/day: 1.50    Average packs/day: 1.5 packs/day for 15.0 years (22.5 ttl pk-yrs)    Types: Cigarettes   Smokeless tobacco: Former    Types: Chew    Quit date: 08/12/2017   Tobacco comments:  1 pack per day-09/19/2021  Vaping Use   Vaping status: Never Used  Substance and Sexual Activity   Alcohol use: Yes    Alcohol/week: 24.0 standard drinks of alcohol    Types: 24 Cans of beer per week    Comment: 24 per day   Drug use: Yes    Frequency: 14.0 times per week    Types: Marijuana    Comment: heroin, cocaine, acid, pain killers; last use yrs ago, marijuana daily use 08/13/18    Sexual activity: Not Currently    Partners: Female  Other Topics Concern   Not on  file  Social History Narrative   Lives at home with parents.      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      Hobbies - computers, outdoors, fishing      Diet - regular      Exercise - none   Caffeine- coffee, 2 pots daily       H/o cocaine/heroin abuse 18 -23 y.o    smoker   Social Drivers of Corporate investment banker Strain: Low Risk  (02/07/2023)   Overall Financial Resource Strain (CARDIA)    Difficulty of Paying Living Expenses: Not hard at all  Food Insecurity: No Food Insecurity (02/07/2023)   Hunger Vital Sign    Worried About Running Out of Food in the Last Year: Never true    Ran Out of Food in the Last Year: Never true  Transportation Needs: Unmet Transportation Needs (02/07/2023)   PRAPARE - Administrator, Civil Service (Medical): Yes    Lack of Transportation (Non-Medical): No  Physical Activity: Insufficiently Active (02/07/2023)   Exercise Vital Sign    Days of Exercise per Week: 3 days    Minutes of Exercise per Session: 10 min  Stress: Stress Concern Present (02/07/2023)   [employer] of Occupational Health - Occupational Stress Questionnaire    Feeling of Stress : To some extent  Social Connections: Socially Isolated (02/07/2023)   Social Connection and Isolation Panel [NHANES]    Frequency of Communication with Friends and Family: Once a week    Frequency of Social Gatherings with Friends and Family: Once a week    Attends Religious Services: Never    Database administrator or Organizations: No    Attends Banker Meetings: Never    Marital Status: Never married  Intimate Partner Violence: Not At Risk (01/30/2023)   Humiliation, Afraid, Rape, and Kick questionnaire    Fear of Current or Ex-Partner: No    Emotionally Abused: No    Physically Abused: No    Sexually Abused: No    Review of Systems: See  HPI, otherwise negative ROS  Physical Exam: There were no vitals taken for this visit. General:   Alert,  pleasant and cooperative in NAD Head:  Normocephalic and atraumatic. Neck:  Supple; no masses or thyromegaly. Lungs:  Clear throughout to auscultation, normal respiratory effort.    Heart:  +S1, +S2, Regular rate and rhythm, No edema. Abdomen:  Soft, nontender and nondistended. Normal bowel sounds, without guarding, and without rebound.   Neurologic:  Alert and  oriented x4;  grossly normal neurologically.  Impression/Plan: Michael [name] is here for an colonoscopy to be performed for surveillance due to prior history of colon polyps  Risks, benefits, limitations, and alternatives regarding  colonoscopy have been reviewed with the patient.  Questions have been answered.  All parties agreeable.   Wyline Mood, MD  03/27/2023, 9:56 AM

## 2023-03-28 ENCOUNTER — Encounter: Payer: Self-pay | Admitting: Gastroenterology

## 2023-03-28 LAB — SURGICAL PATHOLOGY

## 2023-04-06 ENCOUNTER — Ambulatory Visit
Admission: RE | Admit: 2023-04-06 | Discharge: 2023-04-06 | Disposition: A | Discharge status: Home / Self Care | Payer: Medicare Other | Source: Ambulatory Visit | Attending: Otolaryngology | Admitting: Otolaryngology

## 2023-04-06 DIAGNOSIS — H9041 Sensorineural hearing loss, unilateral, right ear, with unrestricted hearing on the contralateral side: Secondary | ICD-10-CM

## 2023-04-06 IMAGING — CR DG CHEST 1V
2 series · 2 of 2 positions shown · non-contrast
Comparison: Chest radiograph 07/09/2020

CLINICAL DATA: Tachypnea

EXAM:
CHEST  1 VIEW

[chest ap (1 of 2)]
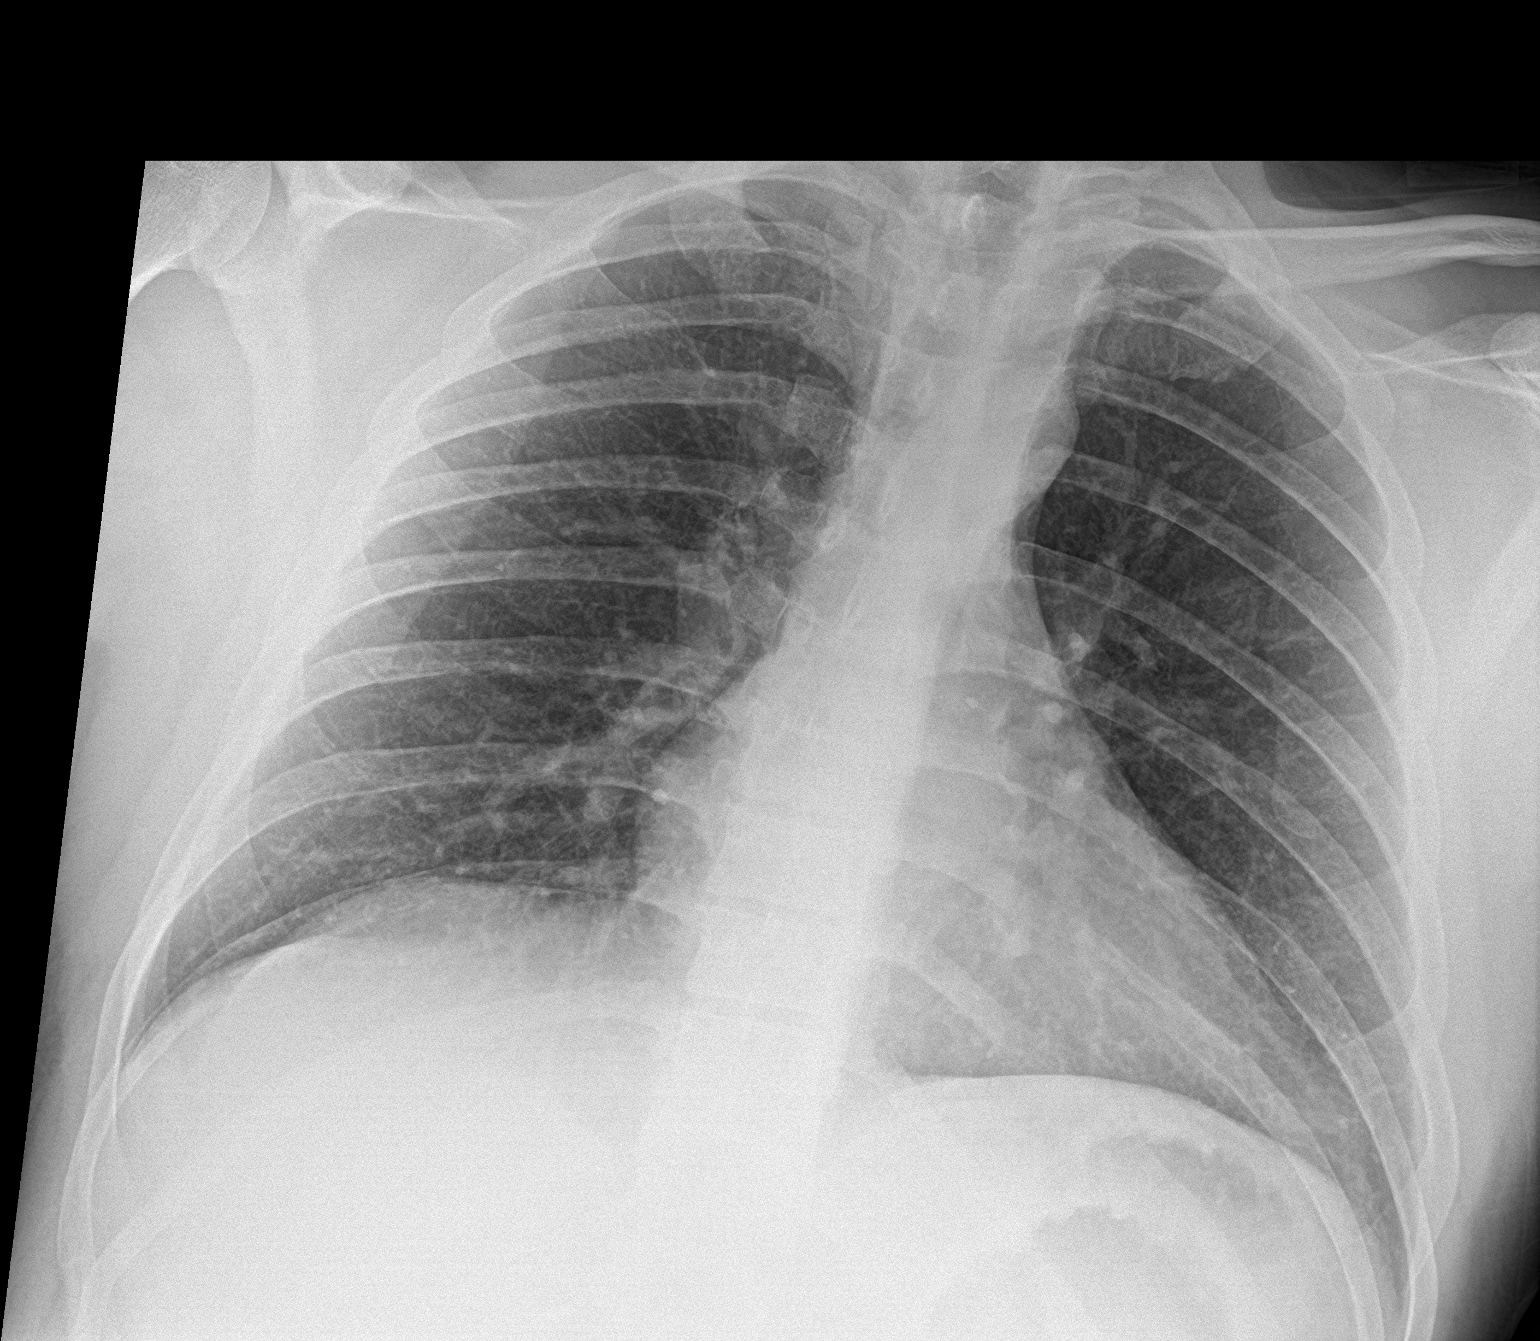

[chest ap (2 of 2)]
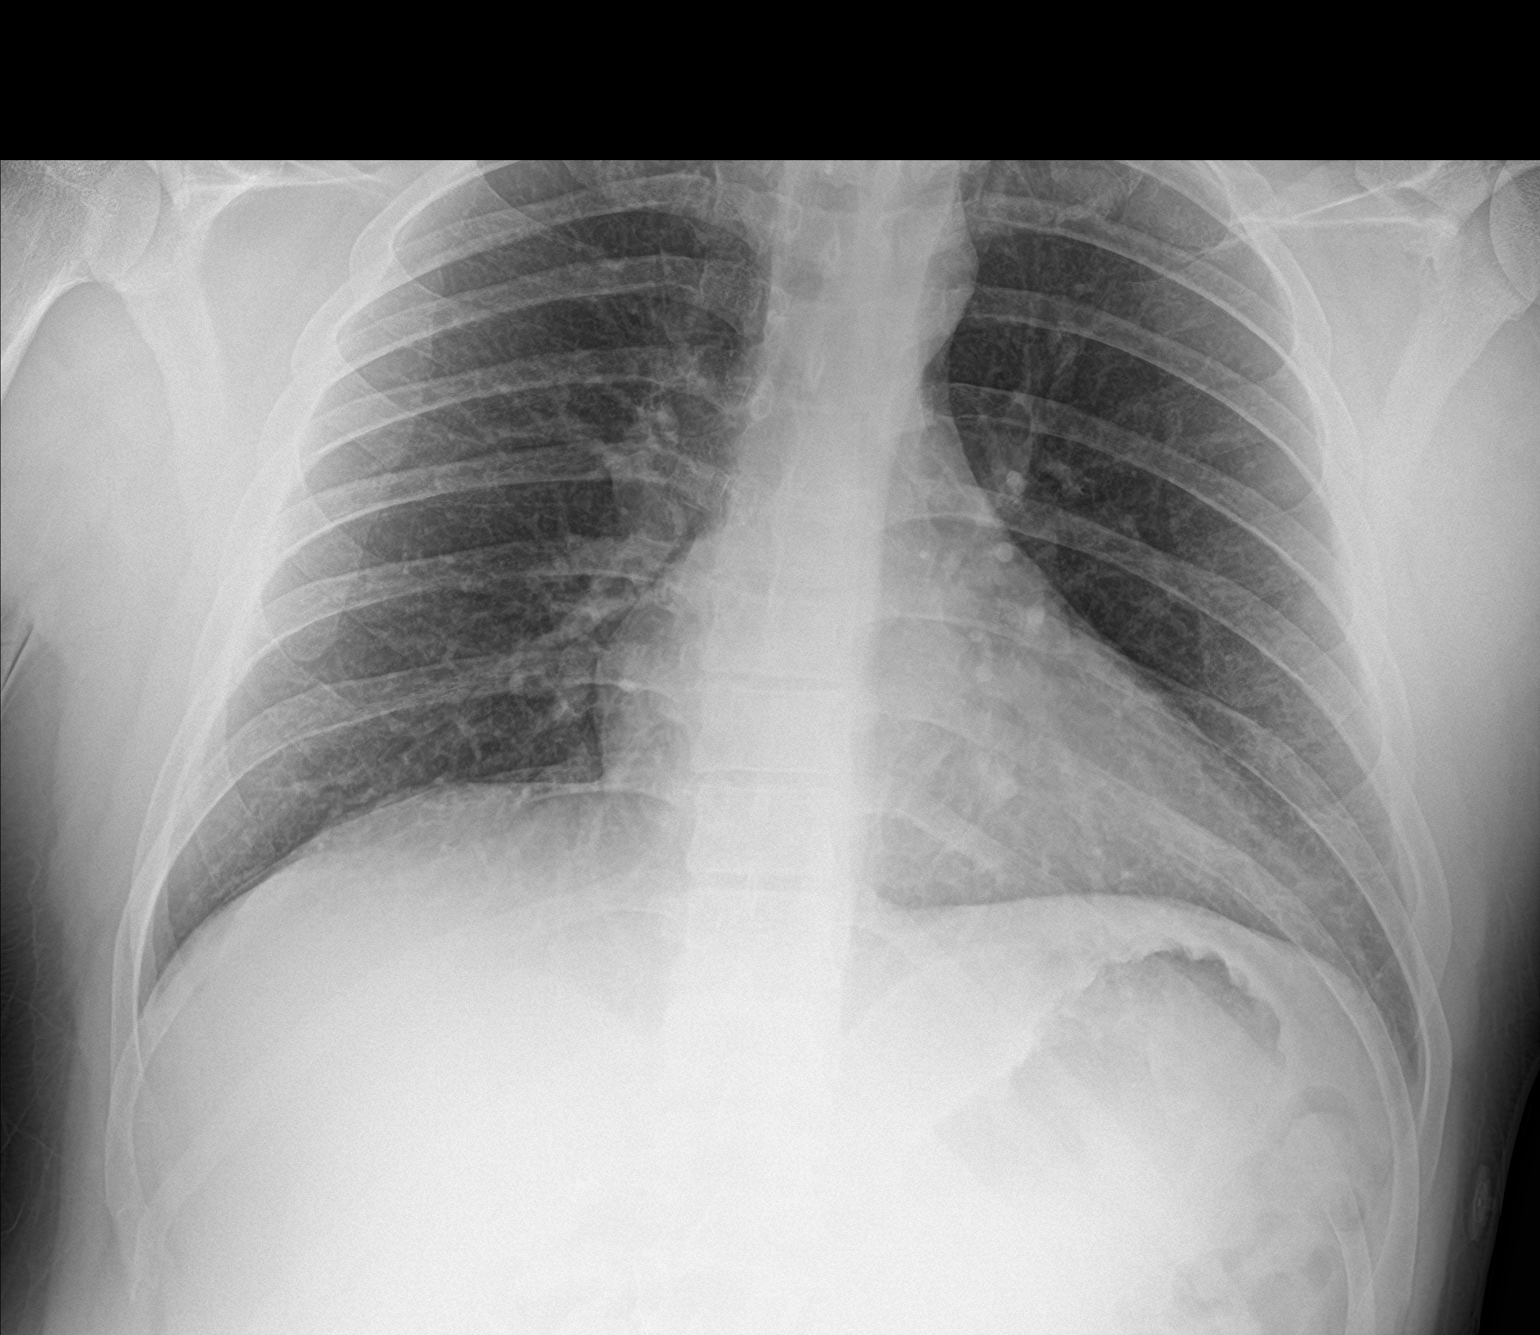

[2 of 2 positions shown; findings below may reference images not displayed]

FINDINGS: The cardiomediastinal silhouette is stable.

There is no focal consolidation or pulmonary edema. There is no
pleural effusion or pneumothorax.

There is no acute osseous abnormality.
IMPRESSION: No radiographic evidence of acute cardiopulmonary process.

## 2023-04-06 MED ORDER — GADOPICLENOL 0.5 MMOL/ML IV SOLN
10.0000 mL | Freq: Once | INTRAVENOUS | Status: AC | PRN
  Administered 2023-04-06: 10 mL via INTRAVENOUS

## 2023-04-08 IMAGING — DX DG ORBITS FOR FOREIGN BODY
2 series · 2 of 2 positions shown · non-contrast
Comparison: None.

CLINICAL DATA: Metal working/exposure; clearance prior to MRI

EXAM:
ORBITS FOR FOREIGN BODY - 2 VIEW

[waters pa (1 of 2)]
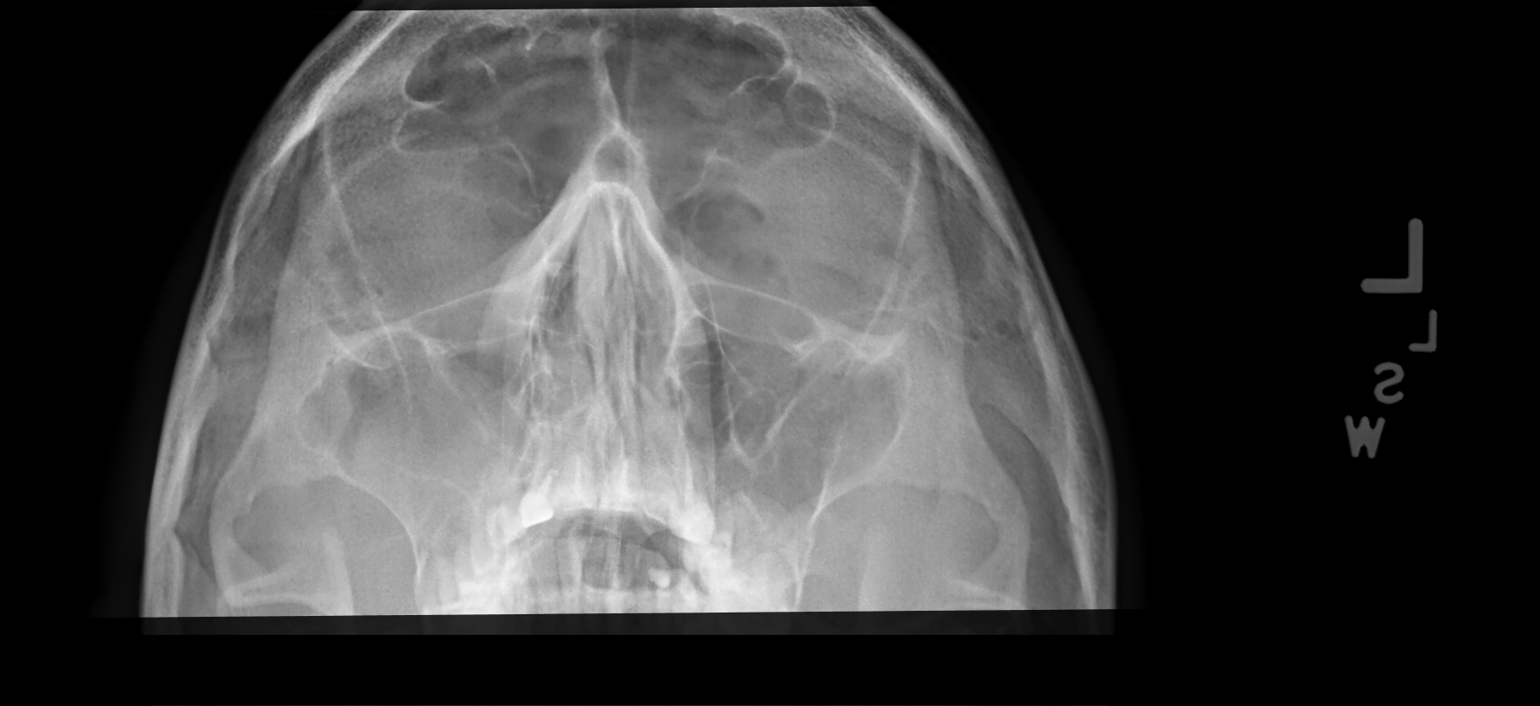

[waters pa (2 of 2)]
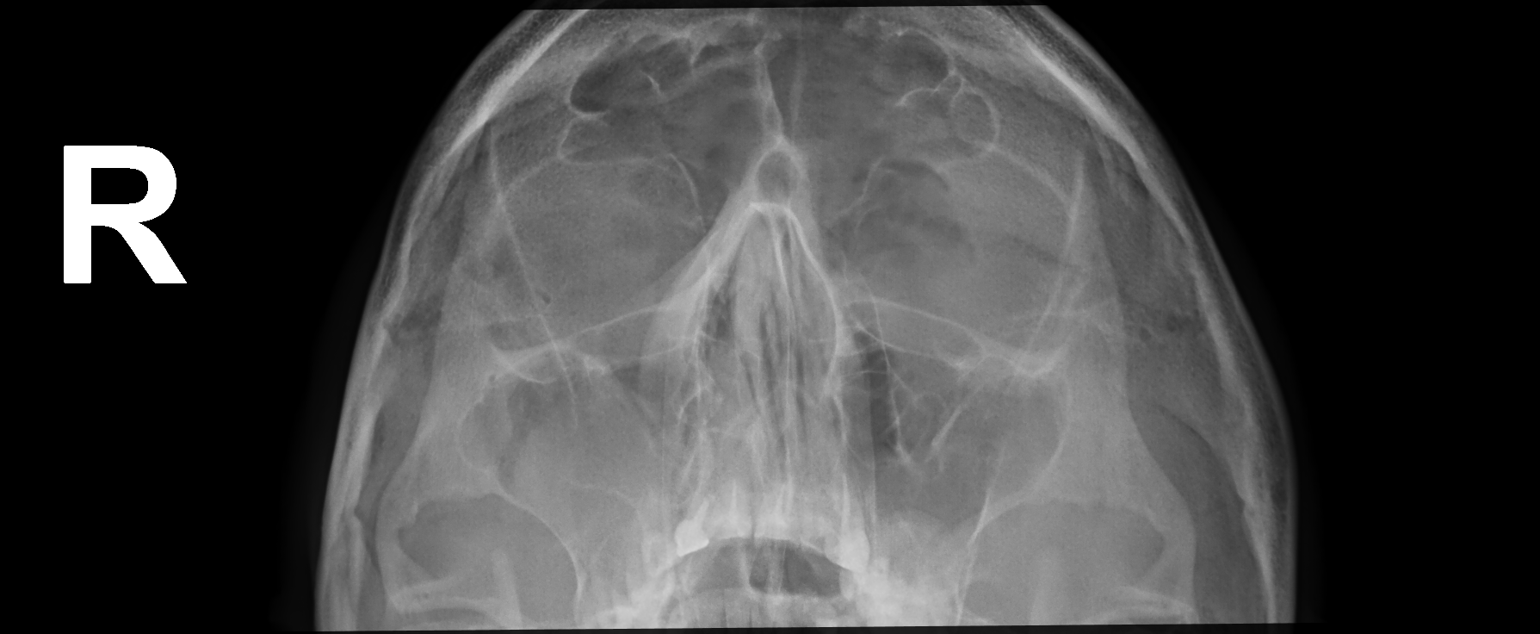

[2 of 2 positions shown; findings below may reference images not displayed]

FINDINGS: There is no evidence of metallic foreign body within the orbits. No
significant bone abnormality identified. Right maxillary sinus
opacification compatible with chronic sinus disease. Other sinuses
are clear. Dental hardware noted.
IMPRESSION: No evidence of metallic foreign body within the orbits.

## 2023-04-10 ENCOUNTER — Encounter: Payer: Self-pay | Admitting: Gastroenterology

## 2023-04-16 DIAGNOSIS — M545 Low back pain, unspecified: Secondary | ICD-10-CM | POA: Diagnosis not present

## 2023-04-19 ENCOUNTER — Encounter: Payer: Self-pay | Admitting: Family

## 2023-04-19 ENCOUNTER — Other Ambulatory Visit: Payer: Self-pay

## 2023-04-19 ENCOUNTER — Encounter: Payer: Self-pay | Admitting: Emergency Medicine

## 2023-04-19 ENCOUNTER — Ambulatory Visit
Admission: RE | Admit: 2023-04-19 | Discharge: 2023-04-19 | Disposition: A | Discharge status: Home / Self Care | Payer: Medicare Other | Source: Ambulatory Visit | Attending: Family | Admitting: Family

## 2023-04-19 ENCOUNTER — Emergency Department
Admission: EM | Admit: 2023-04-19 | Discharge: 2023-04-19 | Disposition: A | Discharge status: Home / Self Care | Payer: Medicare Other | Attending: Emergency Medicine | Admitting: Emergency Medicine

## 2023-04-19 ENCOUNTER — Telehealth: Payer: Self-pay | Admitting: Family

## 2023-04-19 ENCOUNTER — Ambulatory Visit (INDEPENDENT_AMBULATORY_CARE_PROVIDER_SITE_OTHER): Payer: Medicare Other | Admitting: Family

## 2023-04-19 VITALS — BP 140/92 | HR 92 | Temp 98.7°F | Ht 67.0 in | Wt 209.8 lb

## 2023-04-19 DIAGNOSIS — L03311 Cellulitis of abdominal wall: Secondary | ICD-10-CM | POA: Insufficient documentation

## 2023-04-19 DIAGNOSIS — D1803 Hemangioma of intra-abdominal structures: Secondary | ICD-10-CM | POA: Diagnosis not present

## 2023-04-19 DIAGNOSIS — R1011 Right upper quadrant pain: Secondary | ICD-10-CM | POA: Diagnosis not present

## 2023-04-19 DIAGNOSIS — L03818 Cellulitis of other sites: Secondary | ICD-10-CM

## 2023-04-19 DIAGNOSIS — R1031 Right lower quadrant pain: Secondary | ICD-10-CM

## 2023-04-19 DIAGNOSIS — R9431 Abnormal electrocardiogram [ECG] [EKG]: Secondary | ICD-10-CM | POA: Diagnosis not present

## 2023-04-19 DIAGNOSIS — N3289 Other specified disorders of bladder: Secondary | ICD-10-CM | POA: Diagnosis not present

## 2023-04-19 DIAGNOSIS — R109 Unspecified abdominal pain: Secondary | ICD-10-CM | POA: Diagnosis present

## 2023-04-19 LAB — COMPREHENSIVE METABOLIC PANEL
ALT: 30 U/L (ref 0–53)
AST: 19 U/L (ref 0–37)
Albumin: 4.9 g/dL (ref 3.5–5.2)
Alkaline Phosphatase: 87 U/L (ref 39–117)
BUN: 9 mg/dL (ref 6–23)
CO2: 26 meq/L (ref 19–32)
Calcium: 9.9 mg/dL (ref 8.4–10.5)
Chloride: 102 meq/L (ref 96–112)
Creatinine, Ser: 0.91 mg/dL (ref 0.40–1.50)
GFR: 111.7 mL/min (ref >60.00)
Glucose, Bld: 99 mg/dL (ref 70–99)
Potassium: 4.2 meq/L (ref 3.5–5.1)
Sodium: 138 meq/L (ref 135–145)
Total Bilirubin: 0.4 mg/dL (ref 0.2–1.2)
Total Protein: 8 g/dL (ref 6.0–8.3)

## 2023-04-19 LAB — BASIC METABOLIC PANEL
Anion gap: 12 (ref 5–15)
BUN: 10 mg/dL (ref 6–20)
CO2: 24 mmol/L (ref 22–32)
Calcium: 9.7 mg/dL (ref 8.9–10.3)
Chloride: 103 mmol/L (ref 98–111)
Creatinine, Ser: 0.93 mg/dL (ref 0.61–1.24)
GFR, Estimated: >60 mL/min (ref >60)
Glucose, Bld: 95 mg/dL (ref 70–99)
Potassium: 3.7 mmol/L (ref 3.5–5.1)
Sodium: 139 mmol/L (ref 135–145)

## 2023-04-19 LAB — CBC WITH DIFFERENTIAL/PLATELET
Basophils Absolute: 0.1 10*3/uL (ref 0.0–0.1)
Basophils Relative: 1 % (ref 0.0–3.0)
Eosinophils Absolute: 0.6 10*3/uL (ref 0.0–0.7)
Eosinophils Relative: 6.7 % — ABNORMAL HIGH (ref 0.0–5.0)
HCT: 47.7 % (ref 39.0–52.0)
Hemoglobin: 16.3 g/dL (ref 13.0–17.0)
Lymphocytes Relative: 19.2 % (ref 12.0–46.0)
Lymphs Abs: 1.7 10*3/uL (ref 0.7–4.0)
MCHC: 34.1 g/dL (ref 30.0–36.0)
MCV: 96.3 fL (ref 78.0–100.0)
Monocytes Absolute: 1 10*3/uL (ref 0.1–1.0)
Monocytes Relative: 11.9 % (ref 3.0–12.0)
Neutro Abs: 5.3 10*3/uL (ref 1.4–7.7)
Neutrophils Relative %: 61.2 % (ref 43.0–77.0)
Platelets: 242 10*3/uL (ref 150.0–400.0)
RBC: 4.96 Mil/uL (ref 4.22–5.81)
RDW: 13.5 % (ref 11.5–15.5)
WBC: 8.7 10*3/uL (ref 4.0–10.5)

## 2023-04-19 LAB — CBC
HCT: 47.3 % (ref 39.0–52.0)
Hemoglobin: 16.3 g/dL (ref 13.0–17.0)
MCH: 32.5 pg (ref 26.0–34.0)
MCHC: 34.5 g/dL (ref 30.0–36.0)
MCV: 94.2 fL (ref 80.0–100.0)
Platelets: 249 10*3/uL (ref 150–400)
RBC: 5.02 MIL/uL (ref 4.22–5.81)
RDW: 13.1 % (ref 11.5–15.5)
WBC: 9.3 10*3/uL (ref 4.0–10.5)
nRBC: 0 % (ref 0.0–0.2)

## 2023-04-19 LAB — URINALYSIS, ROUTINE W REFLEX MICROSCOPIC
Bacteria, UA: NONE SEEN
Bilirubin Urine: NEGATIVE
Bilirubin Urine: NEGATIVE
Glucose, UA: NEGATIVE mg/dL
Hgb urine dipstick: NEGATIVE
Ketones, ur: NEGATIVE
Ketones, ur: NEGATIVE mg/dL
Leukocytes,Ua: NEGATIVE
Leukocytes,Ua: NEGATIVE
Nitrite: NEGATIVE
Nitrite: NEGATIVE
Protein, ur: NEGATIVE mg/dL
Specific Gravity, Urine: 1.015 (ref 1.000–1.030)
Specific Gravity, Urine: 1.015 (ref 1.005–1.030)
Squamous Epithelial / HPF: 0 /[HPF] (ref 0–5)
Total Protein, Urine: NEGATIVE
Urine Glucose: NEGATIVE
Urobilinogen, UA: 0.2 (ref 0.0–1.0)
pH: 7.5 (ref 5.0–8.0)
pH: 8 (ref 5.0–8.0)

## 2023-04-19 LAB — HEPATIC FUNCTION PANEL
ALT: 34 U/L (ref 0–44)
AST: 23 U/L (ref 15–41)
Albumin: 4.5 g/dL (ref 3.5–5.0)
Alkaline Phosphatase: 80 U/L (ref 38–126)
Bilirubin, Direct: <0.1 mg/dL (ref 0.0–0.2)
Total Bilirubin: 0.8 mg/dL (ref 0.0–1.2)
Total Protein: 7.9 g/dL (ref 6.5–8.1)

## 2023-04-19 LAB — SEDIMENTATION RATE: Sed Rate: 31 mm/h — ABNORMAL HIGH (ref 0–15)

## 2023-04-19 LAB — C-REACTIVE PROTEIN: CRP: 1.4 mg/dL (ref 0.5–20.0)

## 2023-04-19 LAB — LIPASE, BLOOD: Lipase: 24 U/L (ref 11–51)

## 2023-04-19 LAB — CK: Total CK: 67 U/L (ref 49–397)

## 2023-04-19 MED ORDER — CEPHALEXIN 500 MG PO CAPS
500.0000 mg | ORAL_CAPSULE | Freq: Four times a day (QID) | ORAL | 0 refills | Status: AC
Start: 2023-04-19 — End: 2023-04-26

## 2023-04-19 MED ORDER — ACETAMINOPHEN 500 MG PO TABS
1000.0000 mg | ORAL_TABLET | Freq: Once | ORAL | Status: AC
  Administered 2023-04-19: 1000 mg via ORAL
  Filled 2023-04-19: qty 2

## 2023-04-19 MED ORDER — SODIUM CHLORIDE 0.9 % IV BOLUS
1000.0000 mL | Freq: Once | INTRAVENOUS | Status: DC
Start: 2023-04-19 — End: 2023-04-19

## 2023-04-19 MED ORDER — TRAMADOL HCL 50 MG PO TABS: 50.0000 mg | ORAL_TABLET | Freq: Four times a day (QID) | ORAL | 0 refills | Status: AC | PRN

## 2023-04-19 MED ORDER — SODIUM CHLORIDE 0.9 % IV BOLUS
1000.0000 mL | Freq: Once | INTRAVENOUS | Status: AC
  Administered 2023-04-19: 1000 mL via INTRAVENOUS

## 2023-04-19 MED ORDER — IOHEXOL 300 MG/ML  SOLN
100.0000 mL | Freq: Once | INTRAMUSCULAR | Status: AC | PRN
  Administered 2023-04-19: 100 mL via INTRAVENOUS

## 2023-04-19 MED ORDER — KETOROLAC TROMETHAMINE 15 MG/ML IJ SOLN
15.0000 mg | Freq: Once | INTRAMUSCULAR | Status: AC
  Administered 2023-04-19: 15 mg via INTRAVENOUS
  Filled 2023-04-19: qty 1

## 2023-04-19 MED ORDER — CEPHALEXIN 500 MG PO CAPS
500.0000 mg | ORAL_CAPSULE | Freq: Once | ORAL | Status: AC
  Administered 2023-04-19: 500 mg via ORAL
  Filled 2023-04-19: qty 1

## 2023-04-19 MED ORDER — LIDOCAINE 5 % EX PTCH
1.0000 | MEDICATED_PATCH | Freq: Once | CUTANEOUS | Status: DC
  Administered 2023-04-19: 1 via TRANSDERMAL
  Filled 2023-04-19: qty 1

## 2023-04-19 NOTE — ED Triage Notes (Signed)
 Patient to ED via POV for left side flank pain. Ongoing x10 days. Denies urinary difficulty but states it hurts to sit down. Pt standing during triage.
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
## 2023-04-19 NOTE — ED Notes (Signed)
 Family at bedside. Patient denies pain relief from medication given.

## 2023-04-19 NOTE — Telephone Encounter (Signed)
 He presented to UC 3 days ago , 04/16/23  Call [facility]  We need lumbar XRay results faxed asap

## 2023-04-19 NOTE — Patient Instructions (Addendum)
 As discussed, etiology of your pain is unclear at this time.  If pain were to worsen or symptoms were to change at any point while we await labs and imaging today, I do not want to delay care and for your safety I would advise going to the emergency room as we discussed.     We will stay in close touch as we get results.

## 2023-04-19 NOTE — Discharge Instructions (Addendum)
 Take the antibiotic as prescribed and finish the full course.  Return to the ER for new, worsening, or persistent severe low back pain, fever, weakness, or any other new or worsening symptoms that concern you.  IMPRESSION: 1. Decompressed urinary bladder, but the bladder wall appears more thickened and indistinct. UTI or cystitis would be difficult to exclude.   2. Subtle nonspecific asymmetry of the right abdominal wall subcutaneous fat (such as on series 2, image 34), clinical significance doubtful if Physical exam in those areas is unrevealing.   3. No other acute or inflammatory process identified in the abdomen or pelvis. Previous appendectomy, cholecystectomy.

## 2023-04-19 NOTE — ED Triage Notes (Signed)
 First Nurse Note;  Pt via POV from doctors office. Pt c/o RL back pain pain that radiates to his abd. States that pt report extreme pain. CT was done today at 0930 and was negative. Findings suggest possible UTI or cystitis. Per Jason Coop, NP sending over to ED for further workup

## 2023-04-19 NOTE — ED Provider Notes (Signed)
-----------------------------------------   6:40 PM on 04/19/2023 -----------------------------------------  I took over care of this patient from Dr. Fuller Plan.  Urinalysis is negative for acute findings.  On reassessment the patient reports improved pain.  He is stable for discharge home at this time.  I counseled him on the results of the workup and the plan of care including antibiotic treatment for cellulitis.  I have prescribed a course of Keflex as well as some tramadol for pain.  I gave strict return precautions and he expressed understanding.   Dionne Bucy, MD 04/19/23 1840
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
## 2023-04-19 NOTE — Telephone Encounter (Signed)
 Call patient to advise him that I had yet rec'ed results from CT a/p.left message with need for radiology for stat read .  Patient was in intense pain on the phone and generally feeling unwell.  Continues to have extreme pain in his abdomen.  I advised him to get emergency room as I was concerned regards to etiology and pain control.  Advised that I would give triage to charge nurse.  Patient declined going to ED through EMS and preferred to call his mother to drive him.   Approx 1230pm I spoke with Dr Margo Aye at length in regards to CT a/p.  No acute findings other than possible early cystitis.  Discussed right abdominal wall subcutaneous asymmetry. Patient denies recent trauma, drug use.  I gave report to [facility] ED RN for further evaluation of pain control for patient.  Question of cystitis versus early cellulitis. Question right abdominal wall asymmetry.  Pending labs at this time.

## 2023-04-19 NOTE — Assessment & Plan Note (Addendum)
 Acute, worsening pain.  Patient seen in urgent care 3 days ago.  Requesting lumbar x-ray film.  No evidence of rash on exam.  I suspect pain is radiating into bilateral thighs.   concern for radiation of pain and acute abdomen. Pending stat CT abdomen and pelvis to evaluate for renal stone, infection. Pending labs, urine.  Discussed with patient strict precautions and if pain worsened or new symptoms presented today during awaiting outpatient workup, we jointly agreed he would present immediately to the emergency room

## 2023-04-20 ENCOUNTER — Encounter: Payer: Self-pay | Admitting: Family

## 2023-04-20 LAB — URINE CULTURE
Culture: NO GROWTH
MICRO NUMBER:: 16138054
Result:: NO GROWTH
SPECIMEN QUALITY:: ADEQUATE

## 2023-04-20 NOTE — Telephone Encounter (Signed)
 Requested documentation has been printed for the provider from Care Everywhere. Provider has reviewed and placed in scan bin for patient's chart.

## 2023-04-26 ENCOUNTER — Encounter: Payer: Self-pay | Admitting: Family

## 2023-04-26 ENCOUNTER — Ambulatory Visit: Payer: Medicare Other | Admitting: Family

## 2023-04-26 VITALS — BP 130/80 | HR 87 | Temp 98.2°F | Ht 67.0 in | Wt 208.4 lb

## 2023-04-26 DIAGNOSIS — R7303 Prediabetes: Secondary | ICD-10-CM | POA: Diagnosis not present

## 2023-04-26 DIAGNOSIS — L0390 Cellulitis, unspecified: Secondary | ICD-10-CM | POA: Diagnosis not present

## 2023-04-26 DIAGNOSIS — Z87898 Personal history of other specified conditions: Secondary | ICD-10-CM | POA: Diagnosis not present

## 2023-04-26 DIAGNOSIS — K2100 Gastro-esophageal reflux disease with esophagitis, without bleeding: Secondary | ICD-10-CM | POA: Diagnosis not present

## 2023-04-26 DIAGNOSIS — R899 Unspecified abnormal finding in specimens from other organs, systems and tissues: Secondary | ICD-10-CM | POA: Diagnosis not present

## 2023-04-26 MED ORDER — OMEPRAZOLE 20 MG PO CPDR: 20.0000 mg | DELAYED_RELEASE_CAPSULE | Freq: Every day | ORAL | 3 refills | Status: DC

## 2023-04-26 NOTE — Patient Instructions (Signed)
 That you feeling better.  Please complete Keflex.  Nonfasting labs in a couple of weeks

## 2023-04-26 NOTE — Assessment & Plan Note (Signed)
 Abdominal pain and rash has resolved.  No fever.  Reviewed emergency room visit.  Patient will complete Keflex.  Politely declines abdominal exam in the absence of pain, rash.  Repeat sed rate in a couple of weeks

## 2023-04-26 NOTE — Progress Notes (Signed)
 Assessment & Plan:  Abnormal laboratory test -     Sedimentation rate; Future  History of prediabetes -     Hemoglobin A1c; Future  Gastroesophageal reflux disease with esophagitis, unspecified whether hemorrhage -     Omeprazole; Take 1 capsule (20 mg total) by mouth daily.  Dispense: 90 capsule; Refill: 3  Prediabetes -     Hemoglobin A1c; Future  Cellulitis, unspecified cellulitis site Assessment & Plan: Abdominal pain and rash has resolved.  No fever.  Reviewed emergency room visit.  Patient will complete Keflex.  Politely declines abdominal exam in the absence of pain, rash.  Repeat sed rate in a couple of weeks      Return precautions given.   Risks, benefits, and alternatives of the medications and treatment plan prescribed today were discussed, and patient expressed understanding.   Education regarding symptom management and diagnosis given to patient on AVS either electronically or printed.  Return if symptoms worsen or fail to improve.  Michael Plowman, FNP  Subjective:    Patient ID: Michael [name], male    DOB: 1990/07/23, 33 y.o.   MRN: [id number]  CC: Michael [name] is a 33 y.o. male who presents today for follow up.   HPI: Feels well today.  No new complaints.  Right side abdominal pain has resolved.  He has a couple doses left of Keflex.  Denies fever, chills, dysuria.   Seen emergency room diagnosed with cellulitis of right sided abdomen  04/19/23 CT abdomen pelvis with decompressed urinary bladder, subtle nonspecific asymmetry right abdominal wall subcutaneous fat.  Urine culture with no growth 04/19/23 Urinalysis negative leukocytes, RBCs WBC 9.3 Sed rate 31  Scheduled for transfer of care with Michael [name] this summer.     Allergies: Bee venom, Quetiapine, Serotonin reuptake inhibitors (ssris), Amoxicillin, Gabapentin, Penicillin g, Prednisone, and Latuda [lurasidone hcl] Current Outpatient Medications on File Prior to Visit  Medication Sig  Dispense Refill   azelastine (ASTELIN) 0.1 % nasal spray Place 2 sprays into both nostrils 2 (two) times daily. Use in each nostril as directed 30 mL 1   cephALEXin (KEFLEX) 500 MG capsule Take 1 capsule (500 mg total) by mouth 4 (four) times daily for 7 days. 28 capsule 0   famotidine-calcium carbonate-magnesium hydroxide (PEPCID COMPLETE) 10-800-165 MG chewable tablet Chew 1 tablet by mouth daily as needed (heartburn or indigestion).     traZODone (DESYREL) 50 MG tablet Take 0.5-1 tablets (25-50 mg total) by mouth at bedtime as needed for sleep. 30 tablet 3   No current facility-administered medications on file prior to visit.    Review of Systems  Constitutional:  Negative for chills and fever.  Respiratory:  Negative for cough.   Cardiovascular:  Negative for chest pain and palpitations.  Gastrointestinal:  Negative for abdominal pain, nausea and vomiting.  Skin:  Negative for rash.      Objective:    BP 130/80   Pulse 87   Temp 98.2 F (36.8 C) (Oral)   Ht 5\' 7"  (1.702 m)   Wt 208 lb 6.4 oz (94.5 kg)   SpO2 97%   BMI 32.64 kg/m  BP Readings from Last 3 Encounters:  04/26/23 130/80  04/19/23 (!) 138/104  04/19/23 (!) 140/92   Wt Readings from Last 3 Encounters:  04/26/23 208 lb 6.4 oz (94.5 kg)  04/19/23 206 lb (93.4 kg)  04/19/23 209 lb 12.8 oz (95.2 kg)    Physical Exam Vitals reviewed.  Constitutional:  Appearance: He is well-developed.  Cardiovascular:     Rate and Rhythm: Regular rhythm.     Heart sounds: Normal heart sounds.  Pulmonary:     Effort: Pulmonary effort is normal. No respiratory distress.     Breath sounds: Normal breath sounds. No wheezing, rhonchi or rales.  Skin:    General: Skin is warm and dry.  Neurological:     Mental Status: He is alert.  Psychiatric:        Speech: Speech normal.        Behavior: Behavior normal.

## 2023-04-27 ENCOUNTER — Other Ambulatory Visit: Payer: Self-pay | Admitting: Family

## 2023-04-27 DIAGNOSIS — R899 Unspecified abnormal finding in specimens from other organs, systems and tissues: Secondary | ICD-10-CM

## 2023-08-08 ENCOUNTER — Ambulatory Visit: Payer: Medicare Other | Admitting: Nurse Practitioner

## 2023-08-08 VITALS — BP 136/80 | HR 83 | Temp 98.1°F | Ht 67.0 in | Wt 208.6 lb

## 2023-08-08 DIAGNOSIS — Z1329 Encounter for screening for other suspected endocrine disorder: Secondary | ICD-10-CM | POA: Diagnosis not present

## 2023-08-08 DIAGNOSIS — F17210 Nicotine dependence, cigarettes, uncomplicated: Secondary | ICD-10-CM | POA: Diagnosis not present

## 2023-08-08 DIAGNOSIS — K2100 Gastro-esophageal reflux disease with esophagitis, without bleeding: Secondary | ICD-10-CM

## 2023-08-08 DIAGNOSIS — H9201 Otalgia, right ear: Secondary | ICD-10-CM

## 2023-08-08 DIAGNOSIS — E785 Hyperlipidemia, unspecified: Secondary | ICD-10-CM

## 2023-08-08 DIAGNOSIS — R7303 Prediabetes: Secondary | ICD-10-CM | POA: Diagnosis not present

## 2023-08-08 LAB — CBC WITH DIFFERENTIAL/PLATELET
Basophils Absolute: 0.1 10*3/uL (ref 0.0–0.1)
Basophils Relative: 1.1 % (ref 0.0–3.0)
Eosinophils Absolute: 0.6 10*3/uL (ref 0.0–0.7)
Eosinophils Relative: 6.8 % — ABNORMAL HIGH (ref 0.0–5.0)
HCT: 44.9 % (ref 39.0–52.0)
Hemoglobin: 15.2 g/dL (ref 13.0–17.0)
Lymphocytes Relative: 29.9 % (ref 12.0–46.0)
Lymphs Abs: 2.4 10*3/uL (ref 0.7–4.0)
MCHC: 33.9 g/dL (ref 30.0–36.0)
MCV: 93.4 fl (ref 78.0–100.0)
Monocytes Absolute: 0.8 10*3/uL (ref 0.1–1.0)
Monocytes Relative: 9.4 % (ref 3.0–12.0)
Neutro Abs: 4.3 10*3/uL (ref 1.4–7.7)
Neutrophils Relative %: 52.8 % (ref 43.0–77.0)
Platelets: 235 10*3/uL (ref 150.0–400.0)
RBC: 4.81 Mil/uL (ref 4.22–5.81)
RDW: 13.2 % (ref 11.5–15.5)
WBC: 8.2 10*3/uL (ref 4.0–10.5)

## 2023-08-08 LAB — COMPREHENSIVE METABOLIC PANEL WITH GFR
ALT: 22 U/L (ref 0–53)
AST: 16 U/L (ref 0–37)
Albumin: 4.8 g/dL (ref 3.5–5.2)
Alkaline Phosphatase: 85 U/L (ref 39–117)
BUN: 10 mg/dL (ref 6–23)
CO2: 27 meq/L (ref 19–32)
Calcium: 9.5 mg/dL (ref 8.4–10.5)
Chloride: 103 meq/L (ref 96–112)
Creatinine, Ser: 0.82 mg/dL (ref 0.40–1.50)
GFR: 116.17 mL/min (ref >60.00)
Glucose, Bld: 89 mg/dL (ref 70–99)
Potassium: 4.4 meq/L (ref 3.5–5.1)
Sodium: 137 meq/L (ref 135–145)
Total Bilirubin: 0.3 mg/dL (ref 0.2–1.2)
Total Protein: 7.2 g/dL (ref 6.0–8.3)

## 2023-08-08 LAB — LIPID PANEL
Cholesterol: 253 mg/dL — ABNORMAL HIGH (ref 0–200)
HDL: 39 mg/dL — ABNORMAL LOW (ref >39.00)
LDL Cholesterol: 180 mg/dL — ABNORMAL HIGH (ref 0–99)
NonHDL: 214.15
Total CHOL/HDL Ratio: 6
Triglycerides: 170 mg/dL — ABNORMAL HIGH (ref 0.0–149.0)
VLDL: 34 mg/dL (ref 0.0–40.0)

## 2023-08-08 LAB — HEMOGLOBIN A1C: Hgb A1c MFr Bld: 5.6 % (ref 4.6–6.5)

## 2023-08-08 LAB — TSH: TSH: 1.13 u[IU]/mL (ref 0.35–5.50)

## 2023-08-08 MED ORDER — OMEPRAZOLE 40 MG PO CPDR: 40.0000 mg | DELAYED_RELEASE_CAPSULE | Freq: Every day | ORAL | 3 refills | Status: AC

## 2023-08-08 NOTE — Progress Notes (Unsigned)
  Bluford Burkitt, NP-C Phone: [phone]  Michael [name] is a 33 y.o. male who presents today for transfer of care.   ***  Social History   Tobacco Use  Smoking Status Every Day   Current packs/day: 1.50   Average packs/day: 1.5 packs/day for 15.0 years (22.5 ttl pk-yrs)   Types: Cigarettes  Smokeless Tobacco Former   Types: Chew   Quit date: 08/12/2017  Tobacco Comments   1 pack per day-09/19/2021    Current Outpatient Medications on File Prior to Visit  Medication Sig Dispense Refill   famotidine -calcium carbonate-magnesium hydroxide (PEPCID  COMPLETE) 10-800-165 MG chewable tablet Chew 1 tablet by mouth daily as needed (heartburn or indigestion).     No current facility-administered medications on file prior to visit.     ROS see history of present illness  Objective  Physical Exam Vitals:   08/08/23 0854  BP: 136/80  Pulse: 83  Temp: 98.1 F (36.7 C)  SpO2: 96%    BP Readings from Last 3 Encounters:  08/08/23 136/80  04/26/23 130/80  04/19/23 (!) 138/104   Wt Readings from Last 3 Encounters:  08/08/23 208 lb 9.6 oz (94.6 kg)  04/26/23 208 lb 6.4 oz (94.5 kg)  04/19/23 206 lb (93.4 kg)    Physical Exam Constitutional:      General: He is not in acute distress.    Appearance: Normal appearance.  HENT:     Head: Normocephalic.     Right Ear: Tympanic membrane normal.     Left Ear: Tympanic membrane normal.   Cardiovascular:     Rate and Rhythm: Normal rate and regular rhythm.     Heart sounds: Normal heart sounds.  Pulmonary:     Effort: Pulmonary effort is normal.     Breath sounds: Normal breath sounds.   Skin:    General: Skin is warm and dry.   Neurological:     General: No focal deficit present.     Mental Status: He is alert.   Psychiatric:        Mood and Affect: Mood normal.        Behavior: Behavior normal.      Assessment/Plan: Please see individual problem list.  Right ear pain  Gastroesophageal reflux disease with  esophagitis without hemorrhage -     CBC with Differential/Platelet -     Omeprazole ; Take 1 capsule (40 mg total) by mouth daily.  Dispense: 90 capsule; Refill: 3  Prediabetes -     Hemoglobin A1c  Hyperlipidemia, unspecified hyperlipidemia type -     Comprehensive metabolic panel with GFR -     Lipid panel  Nicotine  dependence, cigarettes, uncomplicated  Thyroid  disorder screen -     TSH     Return for as needed.   Bluford Burkitt, NP-C [facility]

## 2023-08-13 ENCOUNTER — Ambulatory Visit: Payer: Self-pay | Admitting: Nurse Practitioner

## 2023-08-14 ENCOUNTER — Other Ambulatory Visit: Payer: Self-pay

## 2023-08-14 ENCOUNTER — Other Ambulatory Visit: Payer: Self-pay | Admitting: Nurse Practitioner

## 2023-08-14 DIAGNOSIS — E785 Hyperlipidemia, unspecified: Secondary | ICD-10-CM

## 2023-08-14 MED ORDER — ROSUVASTATIN CALCIUM 10 MG PO TABS: 10.0000 mg | ORAL_TABLET | Freq: Every day | ORAL | 3 refills | Status: AC

## 2023-08-15 ENCOUNTER — Encounter: Payer: Self-pay | Admitting: Nurse Practitioner

## 2023-08-15 NOTE — Assessment & Plan Note (Signed)
 He has quit smoking for one week, with noted improvement in cough and sleep. Encourage continued abstinence from smoking.

## 2023-08-15 NOTE — Assessment & Plan Note (Signed)
 Pain is likely due to hearing aid irritation with no signs of infection. Advise ENT follow-up if pain persists or worsens.

## 2023-08-15 NOTE — Assessment & Plan Note (Signed)
 Symptoms are controlled with 40 mg omeprazole . No dysphagia or significant pain. Prescribe omeprazole  40 mg and continue Pepcid  Complete as needed. Encourage dietary modifications.

## 2023-08-15 NOTE — Assessment & Plan Note (Signed)
 Managed with lifestyle changes. Encourage continued lifestyle modifications including diet and exercise. Check A1c today.

## 2023-08-15 NOTE — Assessment & Plan Note (Signed)
 Managed with lifestyle changes. Encourage continued lifestyle modifications including diet and exercise. Check lipid panel today.

## 2023-08-23 DIAGNOSIS — F17200 Nicotine dependence, unspecified, uncomplicated: Secondary | ICD-10-CM | POA: Diagnosis not present

## 2023-08-23 DIAGNOSIS — R55 Syncope and collapse: Secondary | ICD-10-CM | POA: Diagnosis not present

## 2023-08-23 DIAGNOSIS — Y9389 Activity, other specified: Secondary | ICD-10-CM | POA: Diagnosis not present

## 2023-08-23 DIAGNOSIS — S60511A Abrasion of right hand, initial encounter: Secondary | ICD-10-CM | POA: Diagnosis not present

## 2023-08-23 DIAGNOSIS — S63501A Unspecified sprain of right wrist, initial encounter: Secondary | ICD-10-CM | POA: Diagnosis not present

## 2023-08-24 DIAGNOSIS — R519 Headache, unspecified: Secondary | ICD-10-CM | POA: Diagnosis not present

## 2023-08-24 DIAGNOSIS — I2699 Other pulmonary embolism without acute cor pulmonale: Secondary | ICD-10-CM | POA: Diagnosis not present

## 2023-08-24 DIAGNOSIS — M542 Cervicalgia: Secondary | ICD-10-CM | POA: Diagnosis not present

## 2023-08-24 DIAGNOSIS — M79641 Pain in right hand: Secondary | ICD-10-CM | POA: Diagnosis not present

## 2023-08-24 DIAGNOSIS — R079 Chest pain, unspecified: Secondary | ICD-10-CM | POA: Diagnosis not present

## 2023-08-24 DIAGNOSIS — M25531 Pain in right wrist: Secondary | ICD-10-CM | POA: Diagnosis not present

## 2023-09-25 ENCOUNTER — Other Ambulatory Visit (INDEPENDENT_AMBULATORY_CARE_PROVIDER_SITE_OTHER)

## 2023-09-25 DIAGNOSIS — E785 Hyperlipidemia, unspecified: Secondary | ICD-10-CM

## 2023-09-25 LAB — HEPATIC FUNCTION PANEL
ALT: 27 U/L (ref 0–53)
AST: 20 U/L (ref 0–37)
Albumin: 4.9 g/dL (ref 3.5–5.2)
Alkaline Phosphatase: 76 U/L (ref 39–117)
Bilirubin, Direct: 0.1 mg/dL (ref 0.0–0.3)
Total Bilirubin: 0.5 mg/dL (ref 0.2–1.2)
Total Protein: 7.5 g/dL (ref 6.0–8.3)

## 2023-09-25 LAB — LIPID PANEL
Cholesterol: 210 mg/dL — ABNORMAL HIGH (ref 0–200)
HDL: 38.5 mg/dL — ABNORMAL LOW (ref >39.00)
LDL Cholesterol: 130 mg/dL — ABNORMAL HIGH (ref 0–99)
NonHDL: 171.6
Total CHOL/HDL Ratio: 5
Triglycerides: 210 mg/dL — ABNORMAL HIGH (ref 0.0–149.0)
VLDL: 42 mg/dL — ABNORMAL HIGH (ref 0.0–40.0)

## 2023-09-27 ENCOUNTER — Emergency Department
Admission: EM | Admit: 2023-09-27 | Discharge: 2023-09-27 | Disposition: A | Discharge status: Home / Self Care | Attending: Emergency Medicine | Admitting: Emergency Medicine

## 2023-09-27 ENCOUNTER — Encounter: Payer: Self-pay | Admitting: Emergency Medicine

## 2023-09-27 ENCOUNTER — Other Ambulatory Visit: Payer: Self-pay

## 2023-09-27 ENCOUNTER — Emergency Department

## 2023-09-27 DIAGNOSIS — R079 Chest pain, unspecified: Secondary | ICD-10-CM | POA: Diagnosis present

## 2023-09-27 DIAGNOSIS — R002 Palpitations: Secondary | ICD-10-CM | POA: Insufficient documentation

## 2023-09-27 DIAGNOSIS — R0602 Shortness of breath: Secondary | ICD-10-CM | POA: Insufficient documentation

## 2023-09-27 DIAGNOSIS — R0789 Other chest pain: Secondary | ICD-10-CM | POA: Insufficient documentation

## 2023-09-27 LAB — CBC
HCT: 44.3 % (ref 39.0–52.0)
Hemoglobin: 15.5 g/dL (ref 13.0–17.0)
MCH: 31.8 pg (ref 26.0–34.0)
MCHC: 35 g/dL (ref 30.0–36.0)
MCV: 91 fL (ref 80.0–100.0)
Platelets: 254 K/uL (ref 150–400)
RBC: 4.87 MIL/uL (ref 4.22–5.81)
RDW: 12.5 % (ref 11.5–15.5)
WBC: 8.9 K/uL (ref 4.0–10.5)
nRBC: 0 % (ref 0.0–0.2)

## 2023-09-27 LAB — BASIC METABOLIC PANEL WITH GFR
Anion gap: 15 (ref 5–15)
BUN: 12 mg/dL (ref 6–20)
CO2: 22 mmol/L (ref 22–32)
Calcium: 10 mg/dL (ref 8.9–10.3)
Chloride: 101 mmol/L (ref 98–111)
Creatinine, Ser: 0.98 mg/dL (ref 0.61–1.24)
GFR, Estimated: >60 mL/min (ref >60)
Glucose, Bld: 134 mg/dL — ABNORMAL HIGH (ref 70–99)
Potassium: 3.6 mmol/L (ref 3.5–5.1)
Sodium: 138 mmol/L (ref 135–145)

## 2023-09-27 LAB — TROPONIN I (HIGH SENSITIVITY)
Troponin I (High Sensitivity): 3 ng/L (ref <18)
Troponin I (High Sensitivity): 4 ng/L (ref <18)

## 2023-09-27 MED ORDER — KETOROLAC TROMETHAMINE 30 MG/ML IJ SOLN
30.0000 mg | Freq: Once | INTRAMUSCULAR | Status: AC
  Administered 2023-09-27: 30 mg via INTRAVENOUS
  Filled 2023-09-27: qty 1

## 2023-09-27 NOTE — ED Provider Notes (Signed)
 [facility] Provider Note   Event Date/Time   First MD Initiated Contact with Patient 09/27/23 1549     (approximate) History  Chest Pain  HPI Michael [name] is a 33 y.o. male with a past medical history of polysubstance abuse, epilepsy, anxiety/depression, and GERD who presents complaining of sudden onset central parasternal chest pain with associated palpitations and shortness of breath that began approximately 1 hour prior to arrival.  Patient states that this occurred after he stood up at his job and felt a shooting pain from the bottom of his chest up.  Patient denies any radiating pain to the neck or arms.  Patient denies any similar pain in the past.  Patient denies any heavy alcohol use recently or cessation of chronic alcohol use recently.  Patient states that he has not had a panic attack in 2 years since being on his current medications.  Patient does endorse worsening with palpation ROS: Patient currently denies any vision changes, tinnitus, difficulty speaking, facial droop, sore throat, abdominal pain, nausea/vomiting/diarrhea, dysuria, or weakness/numbness/paresthesias in any extremity   Physical Exam  Triage Vital Signs: ED Triage Vitals  Encounter Vitals Group     BP 09/27/23 1516 (!) 185/109     Girls Systolic BP Percentile --      Girls Diastolic BP Percentile --      Boys Systolic BP Percentile --      Boys Diastolic BP Percentile --      Pulse Rate 09/27/23 1516 (!) 139     Resp 09/27/23 1516 (!) 22     Temp 09/27/23 1544 98.3 F (36.8 C)     Temp Source 09/27/23 1544 Oral     SpO2 09/27/23 1516 100 %     Weight 09/27/23 1507 200 lb (90.7 kg)     Height 09/27/23 1507 5' 7 (1.702 m)     Head Circumference --      Peak Flow --      Pain Score 09/27/23 1507 5     Pain Loc --      Pain Education --      Exclude from Growth Chart --    Most recent vital signs: Vitals:   09/27/23 1552 09/27/23 1556  BP:  (!) 135/93  Pulse:  (!) 107   Resp:  15  Temp:    SpO2: 95% 96%   General: Awake, oriented x4. CV:  Good peripheral perfusion. Resp:  Normal effort. Abd:  No distention. Other:  Middle-aged obese Caucasian male resting comfortably in no acute distress.  Tenderness to palpation over left parasternal border ED Results / Procedures / Treatments  Labs (all labs ordered are listed, but only abnormal results are displayed) Labs Reviewed  BASIC METABOLIC PANEL WITH GFR - Abnormal; Notable for the following components:      Result Value   Glucose, Bld 134 (*)    All other components within normal limits  CBC  TROPONIN I (HIGH SENSITIVITY)  TROPONIN I (HIGH SENSITIVITY)   EKG ED ECG REPORT I, Artist MARLA Kerns, the attending physician, personally viewed and interpreted this ECG. Date: 09/27/2023 EKG Time: 1506 Rate: 133 Rhythm: Tachycardic sinus rhythm QRS Axis: normal Intervals: normal ST/T Wave abnormalities: normal Narrative Interpretation: Tachycardic sinus rhythm.  No evidence of acute ischemia RADIOLOGY ED MD interpretation: 2 view chest x-ray interpreted by me shows no evidence of acute abnormalities including no pneumonia, pneumothorax, or widened mediastinum - All radiology independently interpreted and agree with radiology assessment Official  radiology report(s): DG Chest 2 View Result Date: 09/27/2023 CLINICAL DATA:  Chest pain. EXAM: DG CHEST 2V COMPARISON:  Cardiac CT 11/24/2021.  Radiographs 08/09/2021. FINDINGS: Mild rotation on both views. The heart size and mediastinal contours are normal. The lungs are clear. There is no pleural effusion or pneumothorax. No acute osseous findings are identified. IMPRESSION: No active cardiopulmonary process. Electronically Signed   By: Elsie Perone M.D.   On: 09/27/2023 15:47   PROCEDURES: Critical Care performed: No Procedures MEDICATIONS ORDERED IN ED: Medications  ketorolac  (TORADOL ) 30 MG/ML injection 30 mg (30 mg Intravenous Given 09/27/23 1605)    IMPRESSION / MDM / ASSESSMENT AND PLAN / ED COURSE  I reviewed the triage vital signs and the nursing notes.                             The patient is on the cardiac monitor to evaluate for evidence of arrhythmia and/or significant heart rate changes. Patient's presentation is most consistent with acute presentation with potential threat to life or bodily function. This patient presents with atypical chest pain, most likely secondary to musculoskeletal injury. Differential diagnosis includes rib fracture, costochondritis, sternal fracture. Low suspicion for ACS, acute PE (PERC negative), pericarditis / myocarditis, thoracic aortic dissection, pneumothorax, pneumonia or other acute infectious process. Presentation not consistent with other acute, emergent causes of chest pain at this time.  Troponin negative x 1. Plan to order CXR to evaluate for acute cardiopulmonary causes.  Plan: EKG, CXR, pain control  Dispo: Discharge home with home care   FINAL CLINICAL IMPRESSION(S) / ED DIAGNOSES   Final diagnoses:  Anterior chest wall pain   Rx / DC Orders   ED Discharge Orders     None      Note:  This document was prepared using Dragon voice recognition software and may include unintentional dictation errors.   Jossie Artist POUR, MD 09/27/23 [phone]

## 2023-09-27 NOTE — ED Notes (Addendum)
 During pt was getting BP taken in the R arm, pt became very anxious and states that his arm is going numb and he cannot feel.   This RN attempted to started IV fluid in the IV in the L AC due to the HR in the 130-140s and when fluids started pt anxious stating that the fluids are making his arm go numb and states he cannot move his arm due to the fluids. This RN stopped fluids at this time. Pt is hyperventilating and very anxious at this time. Pt taken out of subwait and into the lobby so he can be seen by nursing staff. IV flushes and pulls back bloodwork without any issues.

## 2023-09-27 NOTE — Discharge Instructions (Addendum)
 Please use ibuprofen (Motrin) up to 800 mg every 8 hours, naproxen (Naprosyn) up to 500 mg every 12 hours, and/or acetaminophen (Tylenol) up to 4 g/day for any continued pain.  Please do not use this medication regimen for longer than 7 days

## 2023-09-27 NOTE — ED Notes (Signed)
HR reassessed.

## 2023-09-27 NOTE — ED Triage Notes (Addendum)
 Pt via [facility] from home. Pt c/o intermittent substernal CP, states he feel like a pulsating pain, states the pain happened all of a sudden when he was at the computers. States the pain comes and goes. Pt is alert but very restless and anxious during triage. Pt is unable to sit still. Pt request that he is not to be given any opioids.   180/110 initial BP, most recent 140/80 134 HR, NSR per EMS  100% on RA  EMS gave 324 ASA and 1 nitroglycerin  spray.

## 2023-10-01 ENCOUNTER — Ambulatory Visit: Payer: Self-pay | Admitting: Nurse Practitioner

## 2023-12-25 DIAGNOSIS — Z03818 Encounter for observation for suspected exposure to other biological agents ruled out: Secondary | ICD-10-CM | POA: Diagnosis not present

## 2023-12-25 DIAGNOSIS — R051 Acute cough: Secondary | ICD-10-CM | POA: Diagnosis not present

## 2023-12-25 DIAGNOSIS — H00012 Hordeolum externum right lower eyelid: Secondary | ICD-10-CM | POA: Diagnosis not present

## 2023-12-25 DIAGNOSIS — J019 Acute sinusitis, unspecified: Secondary | ICD-10-CM | POA: Diagnosis not present

## 2023-12-29 ENCOUNTER — Emergency Department

## 2023-12-29 ENCOUNTER — Other Ambulatory Visit: Payer: Self-pay

## 2023-12-29 ENCOUNTER — Emergency Department: Admission: EM | Admit: 2023-12-29 | Discharge: 2023-12-29 | Disposition: A | Discharge status: Home / Self Care

## 2023-12-29 DIAGNOSIS — J069 Acute upper respiratory infection, unspecified: Secondary | ICD-10-CM | POA: Diagnosis not present

## 2023-12-29 DIAGNOSIS — R197 Diarrhea, unspecified: Secondary | ICD-10-CM | POA: Insufficient documentation

## 2023-12-29 DIAGNOSIS — R0789 Other chest pain: Secondary | ICD-10-CM | POA: Diagnosis not present

## 2023-12-29 DIAGNOSIS — R42 Dizziness and giddiness: Secondary | ICD-10-CM | POA: Diagnosis not present

## 2023-12-29 DIAGNOSIS — R079 Chest pain, unspecified: Secondary | ICD-10-CM

## 2023-12-29 DIAGNOSIS — R059 Cough, unspecified: Secondary | ICD-10-CM | POA: Diagnosis present

## 2023-12-29 DIAGNOSIS — F17200 Nicotine dependence, unspecified, uncomplicated: Secondary | ICD-10-CM | POA: Insufficient documentation

## 2023-12-29 DIAGNOSIS — R091 Pleurisy: Secondary | ICD-10-CM | POA: Diagnosis not present

## 2023-12-29 DIAGNOSIS — R0602 Shortness of breath: Secondary | ICD-10-CM | POA: Insufficient documentation

## 2023-12-29 DIAGNOSIS — R112 Nausea with vomiting, unspecified: Secondary | ICD-10-CM | POA: Diagnosis not present

## 2023-12-29 LAB — CBC
HCT: 46.1 % (ref 39.0–52.0)
Hemoglobin: 15.8 g/dL (ref 13.0–17.0)
MCH: 31.9 pg (ref 26.0–34.0)
MCHC: 34.3 g/dL (ref 30.0–36.0)
MCV: 92.9 fL (ref 80.0–100.0)
Platelets: 243 K/uL (ref 150–400)
RBC: 4.96 MIL/uL (ref 4.22–5.81)
RDW: 13.2 % (ref 11.5–15.5)
WBC: 7.2 K/uL (ref 4.0–10.5)
nRBC: 0 % (ref 0.0–0.2)

## 2023-12-29 LAB — HEPATIC FUNCTION PANEL
ALT: 38 U/L (ref 0–44)
AST: 25 U/L (ref 15–41)
Albumin: 4.5 g/dL (ref 3.5–5.0)
Alkaline Phosphatase: 71 U/L (ref 38–126)
Bilirubin, Direct: <0.1 mg/dL (ref 0.0–0.2)
Total Bilirubin: 0.7 mg/dL (ref 0.0–1.2)
Total Protein: 7.5 g/dL (ref 6.5–8.1)

## 2023-12-29 LAB — RESP PANEL BY RT-PCR (RSV, FLU A&B, COVID)  RVPGX2
Influenza A by PCR: NEGATIVE
Influenza B by PCR: NEGATIVE
Resp Syncytial Virus by PCR: NEGATIVE
SARS Coronavirus 2 by RT PCR: NEGATIVE

## 2023-12-29 LAB — BASIC METABOLIC PANEL WITH GFR
Anion gap: 11 (ref 5–15)
BUN: 14 mg/dL (ref 6–20)
CO2: 25 mmol/L (ref 22–32)
Calcium: 9.2 mg/dL (ref 8.9–10.3)
Chloride: 103 mmol/L (ref 98–111)
Creatinine, Ser: 0.82 mg/dL (ref 0.61–1.24)
GFR, Estimated: >60 mL/min (ref >60)
Glucose, Bld: 111 mg/dL — ABNORMAL HIGH (ref 70–99)
Potassium: 4 mmol/L (ref 3.5–5.1)
Sodium: 139 mmol/L (ref 135–145)

## 2023-12-29 LAB — LIPASE, BLOOD: Lipase: 22 U/L (ref 11–51)

## 2023-12-29 LAB — TROPONIN I (HIGH SENSITIVITY): Troponin I (High Sensitivity): 3 ng/L (ref <18)

## 2023-12-29 MED ORDER — IPRATROPIUM-ALBUTEROL 0.5-2.5 (3) MG/3ML IN SOLN
3.0000 mL | Freq: Once | RESPIRATORY_TRACT | Status: AC
  Administered 2023-12-29: 3 mL via RESPIRATORY_TRACT
  Filled 2023-12-29: qty 3

## 2023-12-29 MED ORDER — SODIUM CHLORIDE 0.9 % IV BOLUS
1000.0000 mL | Freq: Once | INTRAVENOUS | Status: AC
  Administered 2023-12-29: 1000 mL via INTRAVENOUS

## 2023-12-29 MED ORDER — FAMOTIDINE IN NACL 20-0.9 MG/50ML-% IV SOLN
20.0000 mg | Freq: Once | INTRAVENOUS | Status: AC
  Administered 2023-12-29: 20 mg via INTRAVENOUS
  Filled 2023-12-29: qty 50

## 2023-12-29 MED ORDER — ONDANSETRON HCL 4 MG/2ML IJ SOLN
4.0000 mg | Freq: Once | INTRAMUSCULAR | Status: AC
  Administered 2023-12-29: 4 mg via INTRAVENOUS
  Filled 2023-12-29: qty 2

## 2023-12-29 MED ORDER — ALBUTEROL SULFATE HFA 108 (90 BASE) MCG/ACT IN AERS: 2.0000 | INHALATION_SPRAY | Freq: Four times a day (QID) | RESPIRATORY_TRACT | 2 refills | Status: AC | PRN

## 2023-12-29 MED ORDER — ONDANSETRON 4 MG PO TBDP: 4.0000 mg | ORAL_TABLET | Freq: Three times a day (TID) | ORAL | 0 refills | Status: AC | PRN

## 2023-12-29 MED ORDER — KETOROLAC TROMETHAMINE 10 MG PO TABS: 10.0000 mg | ORAL_TABLET | Freq: Four times a day (QID) | ORAL | 0 refills | Status: AC | PRN

## 2023-12-29 NOTE — ED Triage Notes (Signed)
 Pt to ED from [facility]. Pt states symptoms started yesterday afternoon, having dizziness and sob, N/V with 1x emesis, having diarrhea. Pt states intermittent sharp CP. Pt states not around anyone sick other than GF. Pt also endorses some weakness and not feeling right

## 2023-12-29 NOTE — ED Provider Notes (Signed)
 [facility] Provider Note    Event Date/Time   First MD Initiated Contact with Patient 12/29/23 1253     (approximate)   History   Dizziness and Shortness of Breath   HPI  Michael [name] is a 33 y.o. male  le with a past medical history of polysubstance abuse (in remission), epilepsy, anxiety/depression, GERD and ongoing smoking/vaping who presents with several days of lightheadedness, shortness of breath, nausea vomiting and diarrhea.  He intermittently has sharp chest pain after a coughing fit.  Patients girlfriend has similar symptoms and is another patient in our emergency department.  Denies any abdominal pain changes in urinalysis.  No SI HI or AVH S      Physical Exam   Triage Vital Signs: ED Triage Vitals  Encounter Vitals Group     BP 12/29/23 1229 (!) 156/105     Girls Systolic BP Percentile --      Girls Diastolic BP Percentile --      Boys Systolic BP Percentile --      Boys Diastolic BP Percentile --      Pulse Rate 12/29/23 1229 97     Resp 12/29/23 1229 18     Temp --      Temp src --      SpO2 12/29/23 1229 98 %     Weight 12/29/23 1234 200 lb (90.7 kg)     Height 12/29/23 1234 5' 7 (1.702 m)     Head Circumference --      Peak Flow --      Pain Score 12/29/23 1234 3     Pain Loc --      Pain Education --      Exclude from Growth Chart --     Most recent vital signs: Vitals:   12/29/23 1400 12/29/23 1430  BP: 138/89 (!) 142/90  Pulse: 87 82  Resp:  18  SpO2: 99% 99%    Nursing Triage Note reviewed. Vital signs reviewed and patients oxygen saturation is normoxic  General: Patient is well nourished, well developed, awake and alert, resting comfortably in no acute distress Head: Normocephalic and atraumatic Eyes: Normal inspection, extraocular muscles intact, no conjunctival pallor Ear, nose, throat: Normal external exam Neck: Normal range of motion Respiratory: Patient is in no respiratory distress, lungs wheezes  throughout Cardiovascular: Patient is not tachycardic, RRR without murmur appreciated GI: Abd SNT with no guarding or rebound  Back: Normal inspection of the back with good strength and range of motion throughout all ext Extremities: pulses intact with good cap refills, no LE pitting edema or calf tenderness Neuro: The patient is alert and oriented to person, place, and time, appropriately conversive, with 5/5 bilat UE/LE strength, no gross motor or sensory defects noted. Coordination appears to be adequate. Skin: Warm, dry, and intact Psych: normal mood and affect, no SI or HI  ED Results / Procedures / Treatments   Labs (all labs ordered are listed, but only abnormal results are displayed) Labs Reviewed  BASIC METABOLIC PANEL WITH GFR - Abnormal; Notable for the following components:      Result Value   Glucose, Bld 111 (*)    All other components within normal limits  RESP PANEL BY RT-PCR (RSV, FLU A&B, COVID)  [id number]  CBC  HEPATIC FUNCTION PANEL  LIPASE, BLOOD  URINE DRUG SCREEN, QUALITATIVE ([facility] ONLY)  TROPONIN I (HIGH SENSITIVITY)     EKG EKG and rhythm strip are interpreted by myself:  EKG: [Normal sinus rhythm] at heart rate of 94, normal QRS duration, QTc 415, normal ST segments and T waves no ectopy EKG not consistent with Acute STEMI Rhythm strip: NSR in lead II   RADIOLOGY X-ray of chest: No acute abnormality on my independent review interpretation radiologist agrees    PROCEDURES:  Critical Care performed: No  Procedures   MEDICATIONS ORDERED IN ED: Medications  sodium chloride  0.9 % bolus 1,000 mL (0 mLs Intravenous Stopped 12/29/23 1454)  ondansetron  (ZOFRAN ) injection 4 mg (4 mg Intravenous Given 12/29/23 1329)  famotidine  (PEPCID ) IVPB 20 mg premix (0 mg Intravenous Stopped 12/29/23 1403)  ipratropium-albuterol  (DUONEB) 0.5-2.5 (3) MG/3ML nebulizer solution 3 mL (3 mLs Nebulization Given 12/29/23 1329)     IMPRESSION / MDM / ASSESSMENT AND PLAN /  ED COURSE                                HEART Score 2  Differential diagnosis includes, but is not limited to: Bronchospasm, upper respiratory infection, electrolyte derangement anemia less likely atypical ACS   ED course: Patient is well-appearing not tachycardic and not hypoxic.  His symptoms do not seem consistent with pulmonary embolism and reassured that he is satting 98% on room air.  Is pulmonary exam does have some wheezes so I did order a breathing treatment with some improvement in symptoms.  Troponin despite several days of symptoms was not elevated.  He had no leukocytosis no electrolyte derangements and no elevation of his abdominal labs.  His COVID and influenza swabs were negative.  Chest x-ray was unremarkable.  Patient felt improved after IV fluids Pepcid  and a breathing treatment.  He ambulated took p.o. and feels comfortable returning home   Clinical Course as of 12/29/23 1830  Sat Dec 29, 2023  1328 WBC: 7.2 No leukocytosis [HD]  1328 Basic metabolic panel(!) No profound electrolyte derangements [HD]  1328 Troponin I (High Sensitivity): 3 Unremarkable [HD]  1409 Resp panel by RT-PCR (RSV, Flu A&B, Covid) Anterior Nasal Swab Unremarkable [HD]  1420 Patient feels improved.  I reviewed his results and he feels reassured.  He feels comfortable going home.  Return precautions reviewed and patient voiced understanding.  Will discharge him with an albuterol  inhaler, Zofran  and ketorolac  [HD]    Clinical Course User Index [HD] Nicholaus Rolland BRAVO, MD   At time of discharge there is no evidence of acute life, limb, vision, or fertility threat. Patient has stable vital signs, pain is well controlled, patient is ambulatory and p.o. tolerant.  Discharge instructions were completed using the EPIC system. I would refer you to those at this time. All warnings prescriptions follow-up etc. were discussed in detail with the patient. Patient indicates understanding and is agreeable with  this plan. All questions answered.  Patient is made aware that they may return to the emergency department for any worsening or new condition or for any other emergency.  -- Risk: 5 This patient has a high risk of morbidity due to further diagnostic testing or treatment. Rationale: This patient's evaluation and management involve a high risk of morbidity due to the potential severity of presenting symptoms, need for diagnostic testing, and/or initiation of treatment that may require close monitoring. The differential includes conditions with potential for significant deterioration or requiring escalation of care. Treatment decisions in the ED, including medication administration, procedural interventions, or disposition planning, reflect this level of risk. COPA: 5 The patient has the  following acute or chronic illness/injury that poses a possible threat to life or bodily function: [X] : The patient has a potentially serious acute condition or an acute exacerbation of a chronic illness requiring urgent evaluation and management in the Emergency Department. The clinical presentation necessitates immediate consideration of life-threatening or function-threatening diagnoses, even if they are ultimately ruled out.   FINAL CLINICAL IMPRESSION(S) / ED DIAGNOSES   Final diagnoses:  Upper respiratory tract infection, unspecified type  Pleuritis  Chest pain, unspecified type     Rx / DC Orders   ED Discharge Orders          Ordered    albuterol  (VENTOLIN  HFA) 108 (90 Base) MCG/ACT inhaler  Every 6 hours PRN        12/29/23 1421    ketorolac  (TORADOL ) 10 MG tablet  Every 6 hours PRN        12/29/23 1421    ondansetron  (ZOFRAN -ODT) 4 MG disintegrating tablet  Every 8 hours PRN        12/29/23 1421             Note:  This document was prepared using Dragon voice recognition software and may include unintentional dictation errors.   Nicholaus Rolland BRAVO, MD 12/29/23 [phone]

## 2023-12-29 NOTE — Discharge Instructions (Signed)

## 2023-12-29 NOTE — ED Notes (Signed)
 Pt to ED with partner from [facility], both being seen for dizziness and SOB. Ambulatory, unlabored respirations. VSS.

## 2024-02-01 ENCOUNTER — Ambulatory Visit: Payer: Medicare Other

## 2024-02-01 DIAGNOSIS — Z0000 Encounter for general adult medical examination without abnormal findings: Secondary | ICD-10-CM | POA: Diagnosis not present

## 2024-02-01 NOTE — Patient Instructions (Addendum)
 Michael [name],  Thank you for taking the time for your Medicare Wellness Visit. I appreciate your continued commitment to your health goals. Please review the care plan we discussed, and feel free to reach out if I can assist you further.  Please note that Annual Wellness Visits do not include a physical exam. Some assessments may be limited, especially if the visit was conducted virtually. If needed, we may recommend an in-person follow-up with your provider.  Ongoing Care Seeing your primary care provider every 3 to 6 months helps us  monitor your health and provide consistent, personalized care.  Appt 2/17 With lester NP and AWV 02/04/2025 @ 10:10.  Referrals If a referral was made during today's visit and you haven't received any updates within two weeks, please contact the referred provider directly to check on the status.  Recommended Screenings:  Health Maintenance  Topic Date Due   Pneumococcal Vaccine (1 of 2 - PCV) Never done   Hepatitis B Vaccine (1 of 3 - 19+ 3-dose series) Never done   HPV Vaccine (1 - 3-dose SCDM series) Never done   COVID-19 Vaccine (3 - Moderna risk series) 07/15/2019   Flu Shot  09/21/2023   Medicare Annual Wellness Visit  01/30/2024   DTaP/Tdap/Td vaccine (3 - Td or Tdap) 11/01/2027   Colon Cancer Screening  03/26/2028   Hepatitis C Screening  Completed   HIV Screening  Completed   Meningitis B Vaccine  Aged Out       02/01/2024   10:12 AM  Advanced Directives  Does Patient Have a Medical Advance Directive? No  Would patient like information on creating a medical advance directive? No - Patient declined    Vision: Annual vision screenings are recommended for early detection of glaucoma, cataracts, and diabetic retinopathy. These exams can also reveal signs of chronic conditions such as diabetes and high blood pressure.  Dental: Annual dental screenings help detect early signs of oral cancer, gum disease, and other conditions linked to overall  health, including heart disease and diabetes.  Please see the attached documents for additional preventive care recommendations.

## 2024-02-01 NOTE — Progress Notes (Signed)
 Chief Complaint  Patient presents with   Medicare Wellness     Subjective:   Michael [name] is a 33 y.o. male who presents for a Medicare Annual Wellness Visit.  Visit info / Clinical Intake: Medicare Wellness Visit Type:: Subsequent Annual Wellness Visit Persons participating in visit and providing information:: patient Medicare Wellness Visit Mode:: Telephone If telephone:: video declined Since this visit was completed virtually, some vitals may be partially provided or unavailable. Missing vitals are due to the limitations of the virtual format.: Unable to obtain vitals - no equipment If Telephone or Video please confirm:: I connected with patient using audio/video enable telemedicine. I verified patient identity with two identifiers, discussed telehealth limitations, and patient agreed to proceed. Patient Location:: Home Provider Location:: Home Interpreter Needed?: No Pre-visit prep was completed: yes AWV questionnaire completed by patient prior to visit?: yes Living arrangements:: lives with spouse/significant other Patient's Overall Health Status Rating: good Typical amount of pain: some Does pain affect daily life?: (!) yes Are you currently prescribed opioids?: no  Dietary Habits and Nutritional Risks How many meals a day?: 2 Eats fruit and vegetables daily?: yes Most meals are obtained by: preparing own meals In the last 2 weeks, have you had any of the following?: (!) nausea, vomiting, diarrhea Diabetic:: no  Functional Status Activities of Daily Living (to include ambulation/medication): Independent Ambulation: Independent with device- listed below Home Assistive Devices/Equipment: Cane Medication Administration: Independent Home Management (perform basic housework or laundry): Independent Manage your own finances?: (!) no Primary transportation is: driving Concerns about hearing?: no  Fall Screening Falls in the past year?: 1 Number of falls in past  year: 1 Was there an injury with Fall?: 0 Fall Risk Category Calculator: 2 Patient Fall Risk Level: Moderate Fall Risk  Fall Risk Patient at Risk for Falls Due to: History of fall(s); Impaired balance/gait Fall risk Follow up: Falls evaluation completed; Education provided; Falls prevention discussed  Home and Transportation Safety: All rugs have non-skid backing?: yes All stairs or steps have railings?: yes Grab bars in the bathtub or shower?: (!) no Have non-skid surface in bathtub or shower?: yes Good home lighting?: yes Regular seat belt use?: yes Hospital stays in the last year:: (!) yes How many hospital stays:: 2 Reason: for one and cardiac issues for the other  Cognitive Assessment Difficulty concentrating, remembering, or making decisions? : yes Will 6CIT or Mini Cog be Completed: yes What year is it?: 0 points What month is it?: 0 points About what time is it?: 0 points Count backwards from 20 to 1: 0 points Repeat the address phrase from earlier: 2 points  Advance Directives (For Healthcare) Does Patient Have a Medical Advance Directive?: No Would patient like information on creating a medical advance directive?: No - Patient declined  Reviewed/Updated  Reviewed/Updated: Reviewed All (Medical, Surgical, Family, Medications, Allergies, Care Teams, Patient Goals)    Allergies (verified) Bee venom, Quetiapine, Serotonin reuptake inhibitors (ssris), Amoxicillin , Gabapentin, Penicillin g, Prednisone, and Latuda [lurasidone hcl]   Current Medications (verified) Outpatient Encounter Medications as of 02/01/2024  Medication Sig   albuterol  (VENTOLIN  HFA) 108 (90 Base) MCG/ACT inhaler Inhale 2 puffs into the lungs every 6 (six) hours as needed for wheezing or shortness of breath.   famotidine -calcium  carbonate-magnesium hydroxide (PEPCID  COMPLETE) [phone] MG chewable tablet Chew 1 tablet by mouth daily as needed (heartburn or indigestion).   ketorolac  (TORADOL ) 10  MG tablet Take 1 tablet (10 mg total) by mouth every 6 (six) hours as  needed.   omeprazole  (PRILOSEC) 40 MG capsule Take 1 capsule (40 mg total) by mouth daily.   ondansetron  (ZOFRAN -ODT) 4 MG disintegrating tablet Take 1 tablet (4 mg total) by mouth every 8 (eight) hours as needed for nausea or vomiting.   rosuvastatin  (CRESTOR ) 10 MG tablet Take 1 tablet (10 mg total) by mouth daily.   No facility-administered encounter medications on file as of 02/01/2024.    History: Past Medical History:  Diagnosis Date   Allergy    Anal fissure 04/06/2017   Anxiety    Asthma    Chest pain off and on   COVID-19    02/2020   Depression    Eosinophilia    Family history of adverse reaction to anesthesia    adopted history unknown   GERD (gastroesophageal reflux disease)    Pancreatitis last 2015   S/P appendectomy 05/09/2021   Substance abuse (HCC)    per pt heroin/cocaine age [age]    Past Surgical History:  Procedure Laterality Date   APPENDECTOMY     CHOLECYSTECTOMY  08/16/2015   Procedure: LAPAROSCOPIC CHOLECYSTECTOMY;  Surgeon: Alm Angle, MD;  Location: [facility] ORS;  Service: General;;   COLONOSCOPY WITH PROPOFOL  N/A 03/27/2023   Procedure: COLONOSCOPY WITH PROPOFOL ;  Surgeon: Therisa Bi, MD;  Location: [facility] ENDOSCOPY;  Service: Gastroenterology;  Laterality: N/A;   ESOPHAGOGASTRODUODENOSCOPY (EGD) WITH PROPOFOL  N/A 07/27/2016   Procedure: ESOPHAGOGASTRODUODENOSCOPY (EGD) WITH PROPOFOL ;  Surgeon: Teressa Toribio SQUIBB, MD;  Location: [facility] ENDOSCOPY;  Service: Endoscopy;  Laterality: N/A;   ESOPHAGOGASTRODUODENOSCOPY (EGD) WITH PROPOFOL  N/A 10/10/2017   Procedure: ESOPHAGOGASTRODUODENOSCOPY (EGD) WITH PROPOFOL ;  Surgeon: Janalyn Keene NOVAK, MD;  Location: [facility] ENDOSCOPY;  Service: Endoscopy;  Laterality: N/A;   POLYPECTOMY  03/27/2023   Procedure: POLYPECTOMY;  Surgeon: Therisa Bi, MD;  Location: [facility] ENDOSCOPY;  Service: Gastroenterology;;   TONSILLECTOMY AND ADENOIDECTOMY  as child   and  adenoids   XI ROBOTIC LAPAROSCOPIC ASSISTED APPENDECTOMY N/A 04/29/2021   Procedure: XI ROBOTIC LAPAROSCOPIC ASSISTED APPENDECTOMY;  Surgeon: Tye Millet, DO;  Location: [facility] ORS;  Service: General;  Laterality: N/A;   Family History  Adopted: Yes  Problem Relation Age of Onset   Diabetes Paternal Grandfather    Lung cancer Paternal Grandfather    Ulcers Father    Colon cancer Neg Hx    Social History   Occupational History    Comment: games developer   Occupation: pressure washer  Tobacco Use   Smoking status: Every Day    Current packs/day: 1.50    Average packs/day: 1.5 packs/day for 15.0 years (22.5 ttl pk-yrs)    Types: Cigarettes   Smokeless tobacco: Former    Types: Chew    Quit date: 08/12/2017   Tobacco comments:    1 pack per day-09/19/2021  Vaping Use   Vaping status: Never Used  Substance and Sexual Activity   Alcohol use: Yes    Alcohol/week: 24.0 standard drinks of alcohol    Types: 24 Cans of beer per week    Comment: 1 beer day   Drug use: Yes    Frequency: 14.0 times per week    Types: Marijuana    Comment: heroin, cocaine, acid, pain killers; last use yrs ago, marijuana daily use 08/13/18    Sexual activity: Not Currently    Partners: Female   Tobacco Counseling Ready to quit: Not Answered Counseling given: Not Answered Tobacco comments: 1 pack per day-09/19/2021  SDOH Screenings   Food Insecurity: No Food Insecurity (02/01/2024)  Housing: Low Risk (  02/01/2024)  Transportation Needs: No Transportation Needs (02/01/2024)  Utilities: Not At Risk (02/01/2024)  Alcohol Screen: Medium Risk (02/01/2024)  Depression (PHQ2-9): Low Risk (02/01/2024)  Financial Resource Strain: Low Risk (02/01/2024)  Physical Activity: Insufficiently Active (02/01/2024)  Social Connections: Moderately Integrated (02/01/2024)  Recent Concern: Social Connections - Moderately Isolated (02/01/2024)  Stress: Stress Concern Present (02/01/2024)  Tobacco Use: High Risk  (12/29/2023)  Health Literacy: Adequate Health Literacy (02/01/2024)   See flowsheets for full screening details  Depression Screen PHQ 2 & 9 Depression Scale- Over the past 2 weeks, how often have you been bothered by any of the following problems? Little interest or pleasure in doing things: 0 Feeling down, depressed, or hopeless (PHQ Adolescent also includes...irritable): 0 PHQ-2 Total Score: 0 Trouble falling or staying asleep, or sleeping too much: 1 Feeling tired or having little energy: 1 Poor appetite or overeating (PHQ Adolescent also includes...weight loss): 1 Feeling bad about yourself - or that you are a failure or have let yourself or your family down: 0 Trouble concentrating on things, such as reading the newspaper or watching television (PHQ Adolescent also includes...like school work): 0 Moving or speaking so slowly that other people could have noticed. Or the opposite - being so fidgety or restless that you have been moving around a lot more than usual: 0 Thoughts that you would be better off dead, or of hurting yourself in some way: 0 PHQ-9 Total Score: 3 If you checked off any problems, how difficult have these problems made it for you to do your work, take care of things at home, or get along with other people?: Not difficult at all     Goals Addressed             This Visit's Progress    Patient is working on losing weight.               Objective:    There were no vitals filed for this visit. There is no height or weight on file to calculate BMI.  Hearing/Vision screen No results found. Immunizations and Health Maintenance Health Maintenance  Topic Date Due   Pneumococcal Vaccine (1 of 2 - PCV) Never done   Hepatitis B Vaccines 19-59 Average Risk (1 of 3 - 19+ 3-dose series) Never done   HPV VACCINES (1 - 3-dose SCDM series) Never done   Medicare Annual Wellness (AWV)  01/30/2024   COVID-19 Vaccine (3 - Moderna risk series) 02/17/2024 (Originally  07/15/2019)   Influenza Vaccine  05/20/2024 (Originally 09/21/2023)   DTaP/Tdap/Td (3 - Td or Tdap) 11/01/2027   Colonoscopy  03/26/2028   Hepatitis C Screening  Completed   HIV Screening  Completed   Meningococcal B Vaccine  Aged Out        Assessment/Plan:  This is a routine wellness examination for Michael [name].  Patient Care Team: Gretel App, NP as PCP - General (Nurse Practitioner) Darliss Rogue, MD as PCP - Cardiology (Cardiology) Jacobo Evalene PARAS, MD as Consulting Physician (Oncology) Teressa Toribio SQUIBB, MD (Inactive) as Attending Physician (Gastroenterology)  I have personally reviewed and noted the following in the patients chart:   Medical and social history Use of alcohol, tobacco or illicit drugs  Current medications and supplements including opioid prescriptions. Functional ability and status Nutritional status Physical activity Advanced directives List of other physicians Hospitalizations, surgeries, and ER visits in previous 12 months Vitals Screenings to include cognitive, depression, and falls Referrals and appointments  No orders of the defined types were  placed in this encounter.  In addition, I have reviewed and discussed with patient certain preventive protocols, quality metrics, and best practice recommendations. A written personalized care plan for preventive services as well as general preventive health recommendations were provided to patient.   Arnette LOISE Hoots, CMA   02/01/2024   No follow-ups on file.  After Visit Summary: (Declined) Due to this being a telephonic visit, with patients personalized plan was offered to patient but patient Declined AVS at this time   Nurse Notes: Patient is doing well. He is now wearing hearing aids. He uses a cane on days where his legs are bothering him.

## 2024-03-03 ENCOUNTER — Emergency Department

## 2024-03-03 ENCOUNTER — Emergency Department
Admission: EM | Admit: 2024-03-03 | Discharge: 2024-03-04 | Discharge status: Against Medical Advice | Attending: Emergency Medicine | Admitting: Emergency Medicine

## 2024-03-03 ENCOUNTER — Other Ambulatory Visit: Payer: Self-pay

## 2024-03-03 DIAGNOSIS — M791 Myalgia, unspecified site: Secondary | ICD-10-CM | POA: Diagnosis not present

## 2024-03-03 DIAGNOSIS — Z5321 Procedure and treatment not carried out due to patient leaving prior to being seen by health care provider: Secondary | ICD-10-CM | POA: Insufficient documentation

## 2024-03-03 DIAGNOSIS — R002 Palpitations: Secondary | ICD-10-CM | POA: Insufficient documentation

## 2024-03-03 LAB — CBC
HCT: 45.8 % (ref 39.0–52.0)
Hemoglobin: 15.7 g/dL (ref 13.0–17.0)
MCH: 31.6 pg (ref 26.0–34.0)
MCHC: 34.3 g/dL (ref 30.0–36.0)
MCV: 92.2 fL (ref 80.0–100.0)
Platelets: 255 K/uL (ref 150–400)
RBC: 4.97 MIL/uL (ref 4.22–5.81)
RDW: 12.9 % (ref 11.5–15.5)
WBC: 10.6 K/uL — ABNORMAL HIGH (ref 4.0–10.5)
nRBC: 0 % (ref 0.0–0.2)

## 2024-03-03 LAB — BASIC METABOLIC PANEL WITH GFR
Anion gap: 14 (ref 5–15)
BUN: 15 mg/dL (ref 6–20)
CO2: 25 mmol/L (ref 22–32)
Calcium: 9.4 mg/dL (ref 8.9–10.3)
Chloride: 99 mmol/L (ref 98–111)
Creatinine, Ser: 1.02 mg/dL (ref 0.61–1.24)
GFR, Estimated: >60 mL/min
Glucose, Bld: 120 mg/dL — ABNORMAL HIGH (ref 70–99)
Potassium: 3.7 mmol/L (ref 3.5–5.1)
Sodium: 137 mmol/L (ref 135–145)

## 2024-03-03 LAB — TROPONIN T, HIGH SENSITIVITY: Troponin T High Sensitivity: <15 ng/L (ref 0–19)

## 2024-03-03 NOTE — ED Triage Notes (Addendum)
 Pt reports palpitations that began earlier and generalized body aches, pt reports he recently had URI. Pt reports feeling shaky and fatigued as well pt reports my left side feels weird pt reports he is unable to get left leg to stop shaking that began aprox 1 hour Pta. Pt has no neuro deficits at this time. Pt able to [facility] all extremities speech clear.

## 2024-03-03 NOTE — ED Notes (Signed)
 No code stroke at this time per Dr. Suzanne

## 2024-03-20 NOTE — Progress Notes (Signed)
 Eval for stroke or bleed

## 2024-04-08 ENCOUNTER — Ambulatory Visit: Admitting: Nurse Practitioner

## 2025-02-04 ENCOUNTER — Ambulatory Visit
